# Patient Record
Sex: Male | Born: 1960
Health system: Southern US, Community
[De-identification: ages and names within clinical notes are randomized; demographics above are authoritative.]

## PROBLEM LIST (undated history)

## (undated) DIAGNOSIS — M1990 Unspecified osteoarthritis, unspecified site: Secondary | ICD-10-CM

## (undated) DIAGNOSIS — F1090 Alcohol use, unspecified, uncomplicated: Secondary | ICD-10-CM

## (undated) DIAGNOSIS — N529 Male erectile dysfunction, unspecified: Secondary | ICD-10-CM

## (undated) DIAGNOSIS — R002 Palpitations: Secondary | ICD-10-CM

## (undated) DIAGNOSIS — J45909 Unspecified asthma, uncomplicated: Secondary | ICD-10-CM

## (undated) DIAGNOSIS — R7303 Prediabetes: Secondary | ICD-10-CM

## (undated) DIAGNOSIS — F329 Major depressive disorder, single episode, unspecified: Secondary | ICD-10-CM

## (undated) DIAGNOSIS — K219 Gastro-esophageal reflux disease without esophagitis: Secondary | ICD-10-CM

## (undated) DIAGNOSIS — J309 Allergic rhinitis, unspecified: Secondary | ICD-10-CM

## (undated) DIAGNOSIS — G894 Chronic pain syndrome: Secondary | ICD-10-CM

## (undated) DIAGNOSIS — T7840XA Allergy, unspecified, initial encounter: Secondary | ICD-10-CM

## (undated) DIAGNOSIS — K3184 Gastroparesis: Secondary | ICD-10-CM

## (undated) DIAGNOSIS — Z7289 Other problems related to lifestyle: Secondary | ICD-10-CM

## (undated) DIAGNOSIS — I10 Essential (primary) hypertension: Secondary | ICD-10-CM

## (undated) DIAGNOSIS — M519 Unspecified thoracic, thoracolumbar and lumbosacral intervertebral disc disorder: Secondary | ICD-10-CM

## (undated) DIAGNOSIS — F419 Anxiety disorder, unspecified: Secondary | ICD-10-CM

## (undated) DIAGNOSIS — R892 Abnormal level of other drugs, medicaments and biological substances in specimens from other organs, systems and tissues: Secondary | ICD-10-CM

## (undated) DIAGNOSIS — M5090 Cervical disc disorder, unspecified, unspecified cervical region: Secondary | ICD-10-CM

## (undated) DIAGNOSIS — F32A Depression, unspecified: Secondary | ICD-10-CM

## (undated) DIAGNOSIS — F418 Other specified anxiety disorders: Secondary | ICD-10-CM

## (undated) DIAGNOSIS — E785 Hyperlipidemia, unspecified: Secondary | ICD-10-CM

## (undated) HISTORY — DX: Anxiety disorder, unspecified: F41.9

## (undated) HISTORY — DX: Unspecified asthma, uncomplicated: J45.909

## (undated) HISTORY — DX: Gastroparesis: K31.84

## (undated) HISTORY — DX: Alcohol use, unspecified, uncomplicated: F10.90

## (undated) HISTORY — DX: Other specified anxiety disorders: F41.8

## (undated) HISTORY — DX: Gastro-esophageal reflux disease without esophagitis: K21.9

## (undated) HISTORY — DX: Unspecified thoracic, thoracolumbar and lumbosacral intervertebral disc disorder: M51.9

## (undated) HISTORY — DX: Other problems related to lifestyle: Z72.89

## (undated) HISTORY — DX: Prediabetes: R73.03

## (undated) HISTORY — DX: Chronic pain syndrome: G89.4

## (undated) HISTORY — DX: Hyperlipidemia, unspecified: E78.5

## (undated) HISTORY — DX: Palpitations: R00.2

## (undated) HISTORY — DX: Unspecified osteoarthritis, unspecified site: M19.90

## (undated) HISTORY — DX: Allergy, unspecified, initial encounter: T78.40XA

## (undated) HISTORY — DX: Male erectile dysfunction, unspecified: N52.9

## (undated) HISTORY — DX: Abnormal level of other drugs, medicaments and biological substances in specimens from other organs, systems and tissues: R89.2

## (undated) HISTORY — DX: Major depressive disorder, single episode, unspecified: F32.9

## (undated) HISTORY — DX: Allergic rhinitis, unspecified: J30.9

## (undated) HISTORY — DX: Depression, unspecified: F32.A

## (undated) HISTORY — DX: Essential (primary) hypertension: I10

## (undated) HISTORY — PX: APPENDECTOMY: SHX54

## (undated) HISTORY — DX: Cervical disc disorder, unspecified, unspecified cervical region: M50.90

## (undated) HISTORY — PX: UPPER GASTROINTESTINAL ENDOSCOPY: SHX188

---

## 2000-03-24 ENCOUNTER — Emergency Department (HOSPITAL_COMMUNITY): Admission: EM | Admit: 2000-03-24 | Discharge: 2000-03-24 | Payer: Self-pay | Admitting: Emergency Medicine

## 2000-07-26 ENCOUNTER — Emergency Department (HOSPITAL_COMMUNITY): Admission: EM | Admit: 2000-07-26 | Discharge: 2000-07-26 | Payer: Self-pay | Admitting: Emergency Medicine

## 2003-12-22 ENCOUNTER — Ambulatory Visit (HOSPITAL_COMMUNITY): Admission: RE | Admit: 2003-12-22 | Discharge: 2003-12-22 | Payer: Self-pay | Admitting: Pulmonary Disease

## 2004-02-21 ENCOUNTER — Inpatient Hospital Stay (HOSPITAL_COMMUNITY): Admission: EM | Admit: 2004-02-21 | Discharge: 2004-02-23 | Payer: Self-pay | Admitting: Emergency Medicine

## 2004-04-06 ENCOUNTER — Encounter: Admission: RE | Admit: 2004-04-06 | Discharge: 2004-04-06 | Payer: Self-pay | Admitting: Orthopedic Surgery

## 2004-06-21 ENCOUNTER — Encounter: Admission: RE | Admit: 2004-06-21 | Discharge: 2004-06-21 | Payer: Self-pay | Admitting: Chiropractic Medicine

## 2004-07-13 HISTORY — PX: SHOULDER SURGERY: SHX246

## 2004-07-14 ENCOUNTER — Ambulatory Visit (HOSPITAL_COMMUNITY): Admission: RE | Admit: 2004-07-14 | Discharge: 2004-07-14 | Payer: Self-pay | Admitting: Orthopedic Surgery

## 2004-07-14 ENCOUNTER — Ambulatory Visit (HOSPITAL_BASED_OUTPATIENT_CLINIC_OR_DEPARTMENT_OTHER): Admission: RE | Admit: 2004-07-14 | Discharge: 2004-07-14 | Payer: Self-pay | Admitting: Orthopedic Surgery

## 2004-07-30 ENCOUNTER — Ambulatory Visit: Payer: Self-pay | Admitting: Pulmonary Disease

## 2004-08-09 ENCOUNTER — Ambulatory Visit: Payer: Self-pay | Admitting: Pulmonary Disease

## 2004-08-10 ENCOUNTER — Ambulatory Visit: Payer: Self-pay | Admitting: Gastroenterology

## 2004-08-11 ENCOUNTER — Ambulatory Visit: Payer: Self-pay | Admitting: Gastroenterology

## 2004-09-01 ENCOUNTER — Ambulatory Visit: Payer: Self-pay | Admitting: Gastroenterology

## 2004-09-02 ENCOUNTER — Ambulatory Visit (HOSPITAL_COMMUNITY): Admission: RE | Admit: 2004-09-02 | Discharge: 2004-09-02 | Payer: Self-pay | Admitting: Gastroenterology

## 2004-09-09 ENCOUNTER — Ambulatory Visit: Payer: Self-pay | Admitting: Pulmonary Disease

## 2004-09-17 ENCOUNTER — Ambulatory Visit: Payer: Self-pay | Admitting: Gastroenterology

## 2004-09-18 ENCOUNTER — Ambulatory Visit (HOSPITAL_COMMUNITY): Admission: RE | Admit: 2004-09-18 | Discharge: 2004-09-18 | Payer: Self-pay | Admitting: Pulmonary Disease

## 2004-09-23 ENCOUNTER — Ambulatory Visit: Payer: Self-pay | Admitting: Pulmonary Disease

## 2004-10-07 ENCOUNTER — Encounter: Admission: RE | Admit: 2004-10-07 | Discharge: 2004-10-07 | Payer: Self-pay | Admitting: Neurosurgery

## 2004-10-13 ENCOUNTER — Encounter: Admission: RE | Admit: 2004-10-13 | Discharge: 2004-10-13 | Payer: Self-pay | Admitting: Neurosurgery

## 2004-10-26 ENCOUNTER — Encounter: Admission: RE | Admit: 2004-10-26 | Discharge: 2004-10-26 | Payer: Self-pay | Admitting: Neurosurgery

## 2004-11-25 ENCOUNTER — Encounter: Admission: RE | Admit: 2004-11-25 | Discharge: 2004-11-25 | Payer: Self-pay | Admitting: Neurosurgery

## 2004-12-11 HISTORY — PX: CERVICAL DISCECTOMY: SHX98

## 2004-12-15 ENCOUNTER — Ambulatory Visit (HOSPITAL_COMMUNITY): Admission: RE | Admit: 2004-12-15 | Discharge: 2004-12-16 | Payer: Self-pay | Admitting: Neurosurgery

## 2005-03-02 ENCOUNTER — Ambulatory Visit: Payer: Self-pay | Admitting: Pulmonary Disease

## 2005-08-31 ENCOUNTER — Ambulatory Visit: Payer: Self-pay | Admitting: Pulmonary Disease

## 2005-11-29 ENCOUNTER — Ambulatory Visit: Payer: Self-pay | Admitting: Pulmonary Disease

## 2006-05-31 ENCOUNTER — Ambulatory Visit: Payer: Self-pay | Admitting: Pulmonary Disease

## 2006-07-18 ENCOUNTER — Ambulatory Visit: Payer: Self-pay | Admitting: Gastroenterology

## 2006-07-20 ENCOUNTER — Ambulatory Visit (HOSPITAL_COMMUNITY): Admission: RE | Admit: 2006-07-20 | Discharge: 2006-07-20 | Payer: Self-pay | Admitting: Gastroenterology

## 2006-07-28 ENCOUNTER — Ambulatory Visit: Payer: Self-pay | Admitting: Gastroenterology

## 2006-07-28 ENCOUNTER — Encounter: Payer: Self-pay | Admitting: Gastroenterology

## 2006-09-01 ENCOUNTER — Encounter (INDEPENDENT_AMBULATORY_CARE_PROVIDER_SITE_OTHER): Payer: Self-pay | Admitting: *Deleted

## 2006-09-01 ENCOUNTER — Ambulatory Visit (HOSPITAL_COMMUNITY): Admission: RE | Admit: 2006-09-01 | Discharge: 2006-09-01 | Payer: Self-pay | Admitting: Gastroenterology

## 2006-09-11 ENCOUNTER — Ambulatory Visit: Payer: Self-pay | Admitting: Gastroenterology

## 2007-03-13 ENCOUNTER — Encounter: Admission: RE | Admit: 2007-03-13 | Discharge: 2007-03-13 | Payer: Self-pay | Admitting: Orthopedic Surgery

## 2007-12-27 ENCOUNTER — Encounter: Payer: Self-pay | Admitting: Pulmonary Disease

## 2008-01-03 DIAGNOSIS — F411 Generalized anxiety disorder: Secondary | ICD-10-CM | POA: Insufficient documentation

## 2008-01-03 DIAGNOSIS — E785 Hyperlipidemia, unspecified: Secondary | ICD-10-CM

## 2008-01-03 DIAGNOSIS — J209 Acute bronchitis, unspecified: Secondary | ICD-10-CM

## 2008-01-03 DIAGNOSIS — Z8679 Personal history of other diseases of the circulatory system: Secondary | ICD-10-CM | POA: Insufficient documentation

## 2008-01-03 DIAGNOSIS — R079 Chest pain, unspecified: Secondary | ICD-10-CM

## 2008-01-03 DIAGNOSIS — K219 Gastro-esophageal reflux disease without esophagitis: Secondary | ICD-10-CM | POA: Insufficient documentation

## 2008-02-18 ENCOUNTER — Ambulatory Visit: Payer: Self-pay | Admitting: Pulmonary Disease

## 2008-02-18 DIAGNOSIS — I10 Essential (primary) hypertension: Secondary | ICD-10-CM

## 2008-02-22 LAB — CONVERTED CEMR LAB
Albumin: 4.3 g/dL (ref 3.5–5.2)
Basophils Absolute: 0.1 10*3/uL (ref 0.0–0.1)
Basophils Relative: 0.7 % (ref 0.0–1.0)
Calcium: 9.5 mg/dL (ref 8.4–10.5)
Chloride: 100 meq/L (ref 96–112)
Creatinine, Ser: 1 mg/dL (ref 0.4–1.5)
Direct LDL: 78.5 mg/dL
Eosinophils Absolute: 0.3 10*3/uL (ref 0.0–0.7)
GFR calc Af Amer: 102 mL/min
GFR calc non Af Amer: 84 mL/min
HCT: 41.6 % (ref 39.0–52.0)
HDL: 31.3 mg/dL — ABNORMAL LOW (ref >39.0)
Hgb A1c MFr Bld: 5.6 % (ref 4.6–6.0)
MCHC: 35.2 g/dL (ref 30.0–36.0)
MCV: 89.5 fL (ref 78.0–100.0)
Monocytes Absolute: 0.4 10*3/uL (ref 0.1–1.0)
Neutro Abs: 4.9 10*3/uL (ref 1.4–7.7)
Neutrophils Relative %: 67.9 % (ref 43.0–77.0)
RBC: 4.64 M/uL (ref 4.22–5.81)
TSH: 0.99 microintl units/mL (ref 0.35–5.50)
Total Bilirubin: 0.9 mg/dL (ref 0.3–1.2)

## 2008-03-12 ENCOUNTER — Encounter: Payer: Self-pay | Admitting: Adult Health

## 2008-03-25 ENCOUNTER — Ambulatory Visit: Payer: Self-pay | Admitting: Pulmonary Disease

## 2008-04-01 ENCOUNTER — Telehealth: Payer: Self-pay | Admitting: Pulmonary Disease

## 2008-04-02 LAB — CONVERTED CEMR LAB
Cholesterol: 189 mg/dL (ref 0–200)
Triglycerides: 201 mg/dL (ref 0–149)

## 2008-07-04 ENCOUNTER — Telehealth (INDEPENDENT_AMBULATORY_CARE_PROVIDER_SITE_OTHER): Payer: Self-pay | Admitting: *Deleted

## 2008-07-14 ENCOUNTER — Telehealth (INDEPENDENT_AMBULATORY_CARE_PROVIDER_SITE_OTHER): Payer: Self-pay | Admitting: *Deleted

## 2008-07-28 ENCOUNTER — Encounter: Payer: Self-pay | Admitting: Pulmonary Disease

## 2008-09-10 ENCOUNTER — Encounter: Payer: Self-pay | Admitting: Pulmonary Disease

## 2008-09-24 ENCOUNTER — Encounter: Payer: Self-pay | Admitting: Pulmonary Disease

## 2008-10-13 ENCOUNTER — Telehealth (INDEPENDENT_AMBULATORY_CARE_PROVIDER_SITE_OTHER): Payer: Self-pay | Admitting: *Deleted

## 2008-10-27 ENCOUNTER — Telehealth: Payer: Self-pay | Admitting: Pulmonary Disease

## 2008-10-30 ENCOUNTER — Ambulatory Visit: Payer: Self-pay | Admitting: Pulmonary Disease

## 2008-11-03 ENCOUNTER — Ambulatory Visit: Payer: Self-pay | Admitting: Pulmonary Disease

## 2008-11-03 LAB — CONVERTED CEMR LAB
ALT: 38 units/L (ref 0–53)
AST: 29 units/L (ref 0–37)
Alkaline Phosphatase: 51 units/L (ref 39–117)
BUN: 21 mg/dL (ref 6–23)
Basophils Absolute: 0.1 10*3/uL (ref 0.0–0.1)
Basophils Relative: 0.9 % (ref 0.0–3.0)
CO2: 31 meq/L (ref 19–32)
Chloride: 102 meq/L (ref 96–112)
Creatinine, Ser: 1.2 mg/dL (ref 0.4–1.5)
Direct LDL: 133.9 mg/dL
Eosinophils Absolute: 0.2 10*3/uL (ref 0.0–0.7)
Eosinophils Relative: 2.9 % (ref 0.0–5.0)
GFR calc non Af Amer: 68 mL/min
HDL: 29.6 mg/dL — ABNORMAL LOW (ref >39.0)
Hgb A1c MFr Bld: 5.6 % (ref 4.6–6.0)
Iron: 218 ug/dL — ABNORMAL HIGH (ref 42–165)
Lymphocytes Relative: 26.5 % (ref 12.0–46.0)
Neutrophils Relative %: 62.8 % (ref 43.0–77.0)
PSA: 0.58 ng/mL (ref 0.10–4.00)
Platelets: 187 10*3/uL (ref 150–400)
Potassium: 4.4 meq/L (ref 3.5–5.1)
RBC: 4.68 M/uL (ref 4.22–5.81)
TSH: 1.88 microintl units/mL (ref 0.35–5.50)
Total Bilirubin: 0.9 mg/dL (ref 0.3–1.2)
Triglycerides: 278 mg/dL (ref 0–149)
VLDL: 56 mg/dL — ABNORMAL HIGH (ref 0–40)
WBC: 7.1 10*3/uL (ref 4.5–10.5)

## 2009-01-11 DIAGNOSIS — G894 Chronic pain syndrome: Secondary | ICD-10-CM

## 2009-01-11 DIAGNOSIS — K3184 Gastroparesis: Secondary | ICD-10-CM

## 2009-01-11 DIAGNOSIS — M1990 Unspecified osteoarthritis, unspecified site: Secondary | ICD-10-CM | POA: Insufficient documentation

## 2009-04-06 ENCOUNTER — Telehealth: Payer: Self-pay | Admitting: Pulmonary Disease

## 2009-10-11 ENCOUNTER — Telehealth (INDEPENDENT_AMBULATORY_CARE_PROVIDER_SITE_OTHER): Payer: Self-pay | Admitting: Acute Care

## 2009-10-12 ENCOUNTER — Ambulatory Visit: Payer: Self-pay | Admitting: Pulmonary Disease

## 2009-10-12 ENCOUNTER — Telehealth: Payer: Self-pay | Admitting: Adult Health

## 2009-10-12 DIAGNOSIS — J069 Acute upper respiratory infection, unspecified: Secondary | ICD-10-CM | POA: Insufficient documentation

## 2009-12-11 ENCOUNTER — Ambulatory Visit: Payer: Self-pay | Admitting: Pulmonary Disease

## 2009-12-11 LAB — CONVERTED CEMR LAB
ALT: 30 units/L (ref 0–53)
Albumin: 4.5 g/dL (ref 3.5–5.2)
BUN: 20 mg/dL (ref 6–23)
Basophils Relative: 0.6 % (ref 0.0–3.0)
Bilirubin, Direct: 0.1 mg/dL (ref 0.0–0.3)
CO2: 28 meq/L (ref 19–32)
Calcium: 9.3 mg/dL (ref 8.4–10.5)
Chloride: 107 meq/L (ref 96–112)
Cholesterol: 164 mg/dL (ref 0–200)
Creatinine, Ser: 1.1 mg/dL (ref 0.4–1.5)
Direct LDL: 94.9 mg/dL
Eosinophils Absolute: 0.1 10*3/uL (ref 0.0–0.7)
Glucose, Bld: 112 mg/dL — ABNORMAL HIGH (ref 70–99)
Hemoglobin: 14.5 g/dL (ref 13.0–17.0)
Hgb A1c MFr Bld: 5.3 % (ref 4.6–6.5)
Lymphocytes Relative: 22.9 % (ref 12.0–46.0)
MCHC: 35.7 g/dL (ref 30.0–36.0)
Monocytes Relative: 6.2 % (ref 3.0–12.0)
Neutrophils Relative %: 68.1 % (ref 43.0–77.0)
PSA: 0.41 ng/mL (ref 0.10–4.00)
RBC: 4.41 M/uL (ref 4.22–5.81)
Total Protein: 7.4 g/dL (ref 6.0–8.3)
Triglycerides: 263 mg/dL — ABNORMAL HIGH (ref 0.0–149.0)
WBC: 6.5 10*3/uL (ref 4.5–10.5)

## 2010-07-07 ENCOUNTER — Telehealth: Payer: Self-pay | Admitting: Pulmonary Disease

## 2010-07-07 DIAGNOSIS — T7840XA Allergy, unspecified, initial encounter: Secondary | ICD-10-CM | POA: Insufficient documentation

## 2010-08-19 ENCOUNTER — Ambulatory Visit: Payer: Self-pay | Admitting: Pulmonary Disease

## 2010-08-24 LAB — CONVERTED CEMR LAB
BUN: 23 mg/dL (ref 6–23)
Cholesterol: 227 mg/dL — ABNORMAL HIGH (ref 0–200)
Creatinine, Ser: 1.1 mg/dL (ref 0.4–1.5)
GFR calc non Af Amer: 77.26 mL/min (ref >60.00)
Potassium: 4.9 meq/L (ref 3.5–5.1)
Triglycerides: 128 mg/dL (ref 0.0–149.0)
VLDL: 25.6 mg/dL (ref 0.0–40.0)

## 2010-10-03 ENCOUNTER — Encounter: Payer: Self-pay | Admitting: Orthopedic Surgery

## 2010-10-14 NOTE — Assessment & Plan Note (Signed)
Summary: 2-3 months/apc   CC:  13 month ROV & review of mult medical problems....  History of Present Illness: 50 y/o WM here for a follow up visit and CPX... he has multiple medical problems as noted below...  he is followed for his chronic neck, chest, arm, etc pain in a pain management clinic by DrFullerton...   ~  December 11, 2009:  he's had a pretty good year- save for recent AB exac treated w/ ZPak, Mucinex, & resolved... he is requesting change to generics where poss due to cost... still followed in the [facility] by DrFullerton for his chr pain syndrome & neck problems on Percocet, Celexa, Zanaflex...    Current Problems:   PHYSICAL EXAMINATION (ICD-V70.0) - pt didn't bring med bottles or list of his current medications today- he states that he knows his meds.  ~  NOTE:  he will be due for a screening colonoscopy soon.  ASTHMATIC BRONCHITIS, ACUTE (ICD-466.0) - prev on SINGULAIR but stopped due to $$... he was prev eval by DrESL in 2001 w/ mult meds- Advair, Zyrtek, Nasonex, Astelin, etc.  ~  CXR 2/10 showed normal heart size & clear lungs...  ~  f/u CXR 4/11 showed clear lungs, heart at upper limits of norm, NAD.  HYPERTENSION, BENIGN (ICD-401.1) - on Diovan 80mg /d & low sodium diet... BP= 120/78 today but not checking BP at home... denies HA, fatigue, visual changes, CP, palipit, dizziness, syncope, dyspnea, edema, etc... he would like to change to a generic & we discussed LOSARTAN 100mg /d.  CHEST PAIN, ATYPICAL, HX OF (ICD-V15.89) & PALPITATIONS, HX OF (ICD-V12.50) - on ASA 81mg /d...  he had a cardiac eval in the 90's for these symptoms and mult risk factors including +FamHx by DrGrodecki w/ baseline EKG showing NSSTTWA;  2DEcho was neg w/ norm LV size & function- EF= 60%, no MVP, mild MR;  StressTest was neg x for occas PVC's;  Event Recorder w/ lots of symptoms but only occas PVC's and PAC';  treated w/ rec for smoking cessation, no caffeine, Tranxene Prn...  ~   NuclearStressTest 2005 was neg- no ischemia, no infarct... EF= 54%.  ~  He saw several Orthopedists, DrZ for Rheum, & DrWeymann (neg EMG) in 2005 due to his atyp CWP.  ~  EKG 4/11 showed NSR, WNL, NAD...  HYPERLIPIDEMIA (ICD-272.4) - on TRICORmg/d + low chol/ low fat diet...  ~  FLP 9/07 (wt= 178#) showed TChol 197, TG 408, HDL 31, LDL 93... Fibrate Rx started w/ Tricor.  ~  FLP 2/10 (wt= 186#) showed TChol 201, TG 278, HDL 30, LDL 134... needs better diet & may need statin added.  ~  FLP 4/11 (wt=185#) showed TChol 164, TG 263, HDL 33, LDL 53... change to FENOFIBRATE 160mg /d for $$.  DIABETES MELLITUS, BORDERLINE (ICD-790.29) - on diet Rx alone but he hasn't followed up regularly and his wt is 185#... we discussed diet + exercise program...  ~  prev labs showed FBS= 121... diet Rx discussed.  ~  labs 2/10 showed BS= 107, HgA1c= 5.6  ~  labs 4/11 showed BS= 112, A1c= 5.3  GERD (ICD-530.81) - followed by DrKaplan & eval in 2007 w/ Ba Swallow showed esoph stricture w/ dysphagia... last EGD 12/07 showed esophagitis & stricture dilated... he rec treatment w/ PPI & pt switched to PRILOSEC OTC 20mg /d...  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 he tells me he is followed in a chronic pain clinic by DrFullerton and treated w/ PERCOCET 7.5mg  Prn,  CELEXA ?20mg /d, & ZANAFLEX 4mg  Prn...  ANXIETY (ICD-300.00) - on ALPRAZOLAM 0.5mg  Prn for nerves...   Allergies: 1)  ! Bethanne Ginger  Comments:  Nurse/Medical Assistant: The patient's medications and allergies were reviewed with the patient and were updated in the Medication and Allergy Lists.  Past History:  Past Medical History:  ASTHMATIC BRONCHITIS, ACUTE (ICD-466.0) HYPERTENSION,  BENIGN (ICD-401.1) CHEST PAIN, ATYPICAL, HX OF (ICD-V15.89) PALPITATIONS, HX OF (ICD-V12.50) HYPERLIPIDEMIA (ICD-272.4) DIABETES MELLITUS, BORDERLINE (ICD-790.29) GERD (ICD-530.81) GASTROPARESIS (ICD-536.3) DEGENERATIVE JOINT DISEASE (ICD-715.90) CHRONIC PAIN SYNDROME (ICD-338.4) ANXIETY (ICD-300.00)  Past Surgical History: S/P right shoulder surgery 11/05 by DrMurphy w/ arthroscopy, debridement, distal clavicle resection S/P ant cervical discectomy, decompression, foaminotomy, and fusion by DrBotero 4/06  Family History: Reviewed history from 11/03/2008 and no changes required. Father died age 11 w/ "lung disease", smoker... Mother alive age 27 w/ known ASHD... No Siblings  Social History: Reviewed history from 10/12/2009 and no changes required. 2nd Marriage, 22 yrs... 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" retired on disability  Review of Systems  The patient denies fever, chills, sweats, anorexia, fatigue, weakness, malaise, weight loss, sleep disorder, blurring, diplopia, eye irritation, eye discharge, vision loss, eye pain, photophobia, earache, ear discharge, tinnitus, decreased hearing, nasal congestion, nosebleeds, sore throat, hoarseness, chest pain, palpitations, syncope, dyspnea on exertion, orthopnea, PND, peripheral edema, cough, dyspnea at rest, excessive sputum, hemoptysis, wheezing, pleurisy, nausea, vomiting, diarrhea, constipation, change in bowel habits, abdominal pain, melena, hematochezia, jaundice, gas/bloating, indigestion/heartburn, dysphagia, odynophagia, dysuria, hematuria, urinary frequency, urinary hesitancy, nocturia, incontinence, back pain, joint pain, joint swelling, muscle cramps, muscle weakness, stiffness, arthritis, sciatica, restless legs, leg pain at night, leg pain with exertion, rash, itching, dryness, suspicious lesions, paralysis, paresthesias, seizures, tremors, vertigo,  transient blindness, frequent falls, frequent headaches, difficulty walking, depression, anxiety, memory loss, confusion, cold intolerance, heat intolerance, polydipsia, polyphagia, polyuria, unusual weight change, abnormal bruising, bleeding, enlarged lymph nodes, urticaria, allergic rash, hay fever, and recurrent infections.    Vital Signs:  Patient profile:   50 year old male Height:      67 inches Weight:      185 pounds BMI:     29.08 O2 Sat:      98 % on Room air Temp:     96.9 degrees F oral Pulse rate:   79 / minute BP sitting:   120 / 78  (right arm) Cuff size:   regular  Vitals Entered By: Randell Loop CMA (December 11, 2009 9:56 AM)  O2 Sat at Rest %:  98 O2 Flow:  Room air CC: 13 month ROV & review of mult medical problems... Is Patient Diabetic? No Pain Assessment Patient in pain? no      Comments meds udpated today   Physical Exam  Additional Exam:  WD, WN, 50 y/o WM in NAD... GENERAL:  Alert & oriented; pleasant & cooperative... HEENT:  [place]/AT, EOM-wnl, PERRLA, Fundi-benign, EACs-clear, TMs-wnl, NOSE-clear, THROAT-clear & wnl. NECK:  Supple w/ decrROM; scar of CSpine surg; no JVD; normal carotid impulses w/o bruits;  no thyromegaly or nodules palpated; no lymphadenopathy. CHEST:  Clear to P & A; without wheezes/ rales/ or rhonchi. HEART:  Regular Rhythm; without murmurs/ rubs/ or gallops. ABDOMEN:  Soft &  nontender; normal bowel sounds; no organomegaly or masses detected. RECTAL:  Neg - prostate 2+ & nontender w/o nodules; stool hematest neg. EXT: without deformities, mild arthritic changes; no varicose veins/ venous insuffic/ or edema. NEURO:  CN's intact; motor testing normal; sensory testing normal; gait normal & balance OK. DERM:  No lesions noted; no rash etc...    CXR  Procedure date:  12/11/2009  Findings:      CHEST - 2 VIEW Comparison: 11/03/2008   Findings: Heart size remains at the upper limits of normal and is stable.  No evidence of  pulmonary infiltrate or edema.  No evidence of pleural effusion.  No mass or adenopathy identified.   IMPRESSION: Stable exam.  No active disease.   Read By:  Danae Orleans,  M.D.   EKG  Procedure date:  12/11/2009  Findings:      Normal sinus rhythm with rate of:  70/min... Tracing is WNL, NAD...  SN   MISC. Report  Procedure date:  12/11/2009  Findings:      Lipid Panel (LIPID)   Cholesterol               164 mg/dL                   0-454   Triglycerides        [H]  263.0 mg/dL                 0.9-811.9   HDL                  [L]  14.78 mg/dL                 >29.56 Cholesterol LDL - Direct                             94.9 mg/dL   BMP (METABOL)   Sodium                    145 mEq/L                   135-145   Potassium                 4.2 mEq/L                   3.5-5.1   Chloride                  107 mEq/L                   96-112   Carbon Dioxide            28 mEq/L                    19-32   Glucose              [H]  112 mg/dL                   21-30   BUN                       20 mg/dL                    8-65   Creatinine                1.1 mg/dL  0.4-1.5   Calcium                   9.3 mg/dL                   1.6-10.9   GFR                       75.04 mL/min                >60  Hemoglobin A1C (A1C)   Hemoglobin A1C            5.3 %                       4.6-6.5  Hepatic/Liver Function Panel (HEPATIC)   Total Bilirubin           0.7 mg/dL                   6.0-4.5   Direct Bilirubin          0.1 mg/dL                   4.0-9.8   Alkaline Phosphatase      51 U/L                      39-117   AST                       20 U/L                      0-37   ALT                       30 U/L                      0-53   Total Protein             7.4 g/dL                    1.1-9.1   Albumin                   4.5 g/dL                    4.7-8.2  Comments:      CBC Platelet w/Diff (CBCD)   White Cell Count          6.5 K/uL                     4.5-10.5   Red Cell Count            4.41 Mil/uL                 4.22-5.81   Hemoglobin                14.5 g/dL                   95.6-21.3   Hematocrit                40.7 %                      39.0-52.0   MCV  92.2 fl                     78.0-100.0   Platelet Count            180.0 K/uL                  150.0-400.0   Neutrophil %              68.1 %                      43.0-77.0   Lymphocyte %              22.9 %                      12.0-46.0   Monocyte %                6.2 %                       3.0-12.0   Eosinophils%              2.2 %                       0.0-5.0   Basophils %               0.6 %                       0.0-3.0  TSH (TSH)   FastTSH                   0.79 uIU/mL                 0.35-5.50   Prostate Specific Antigen (PSA)   PSA-Hyb                   0.41 ng/mL                  0.10-4.00   Impression & Recommendations:  Problem # 1:  ASTHMATIC BRONCHITIS, ACUTE (ICD-466.0) He has done well w/o exac...   Problem # 2:  HYPERTENSION, BENIGN (ICD-401.1) Controlled on the Diovan, we will change to LOSARTAN 100mg /d for $$ reasons... His updated medication list for this problem includes:    Losartan Potassium 100 Mg Tabs (Losartan potassium) .Marland Kitchen... Take 1 tab by mouth once daily...  Orders: 12 Lead EKG (12 Lead EKG) T-2 View CXR (71020TC) TLB-Lipid Panel (80061-LIPID) TLB-BMP (Basic Metabolic Panel-BMET) (80048-METABOL) TLB-Hepatic/Liver Function Pnl (80076-HEPATIC) TLB-CBC Platelet - w/Differential (85025-CBCD) TLB-TSH (Thyroid Stimulating Hormone) (84443-TSH) TLB-A1C / Hgb A1C (Glycohemoglobin) (83036-A1C) TLB-PSA (Prostate Specific Antigen) (84153-PSA)  Problem # 3:  CHEST PAIN, ATYPICAL, HX OF (ICD-V15.89) Cardiac is stable, EKG is WNL, he states that prev chest symptoms were all "panic" attacks... Orders: 12 Lead EKG (12 Lead EKG)  Problem # 4:  HYPERLIPIDEMIA (ICD-272.4) FLP is fair-  he wants less $$ med & change Tricor to  Fenofibrate but what he really needs is better diet, incr exercuise, get wt down & he was so counselled... His updated medication list for this problem includes:    Fenofibrate 160 Mg Tabs (Fenofibrate) .Marland Kitchen... Take 1 tab by mouth once daily...  Problem # 5:  DIABETES MELLITUS, BORDERLINE (ICD-790.29) BS borderline but A1c's all normal... get on diet, get wt down...  Problem # 6:  GERD (ICD-530.81) GI is stable.Marland KitchenMarland Kitchen  he will need screening colon soon... His updated medication list for this problem includes:    Prilosec Otc 20 Mg Tbec (Omeprazole magnesium) .Marland Kitchen... Take 1 tablet by mouth once a day  Problem # 7:  CHRONIC PAIN SYNDROME (ICD-338.4) He has mod neck problems w/ pain... followed in pain management clinic DrFullerton...  Problem # 8:  ANXIETY (ICD-300.00) We will refill his Alprazolam for nerves and painic disorder... His updated medication list for this problem includes:    Alprazolam 0.5 Mg Tabs (Alprazolam) .Marland Kitchen... Take 1 tablet by mouth three times a day as needed for nerves...    Celexa 10 Mg Tabs (Citalopram hydrobromide) .Marland Kitchen... Take as directed by pain management...  Complete Medication List: 1)  Adult Aspirin Low Strength 81 Mg Tbdp (Aspirin) .... Take 1 tablet by mouth once a day 2)  Losartan Potassium 100 Mg Tabs (Losartan potassium) .... Take 1 tab by mouth once daily.Marland KitchenMarland Kitchen 3)  Fenofibrate 160 Mg Tabs (Fenofibrate) .... Take 1 tab by mouth once daily.Marland KitchenMarland Kitchen 4)  Prilosec Otc 20 Mg Tbec (Omeprazole magnesium) .... Take 1 tablet by mouth once a day 5)  Alprazolam 0.5 Mg Tabs (Alprazolam) .... Take 1 tablet by mouth three times a day as needed for nerves... 6)  Oxycodone-acetaminophen 7.5-500 Mg Tabs (Oxycodone-acetaminophen) .... Take as directed by pain management... 7)  Zanaflex 4 Mg Caps (Tizanidine hcl) .... Take as directed by pain management... 8)  Celexa 10 Mg Tabs (Citalopram hydrobromide) .... Take as directed by pain management...  Other Orders: Prescription Created  Electronically [phone])  Patient Instructions: 1)  Today we updated your med list- see below.... 2)  We wrote new perscriptions and included ALL generics to save maximum $$$.... 3)  Today we did your follow up CXR, EKG, & fasting blood work... please call the "phone tree" in a few days for your lab results.Marland KitchenMarland Kitchen 4)  Call for any problems.Marland KitchenMarland Kitchen 5)  Please schedule a follow-up appointment in 6 months. Prescriptions: ALPRAZOLAM 0.5 MG  TABS (ALPRAZOLAM) Take 1 tablet by mouth three times a day as needed for nerves...  #100 x prn   Entered and Authorized by:   Michele Mcalpine MD   Signed by:   Michele Mcalpine MD on 12/11/2009   Method used:   Print then Give to Patient   RxID:   [id number] FENOFIBRATE 160 MG TABS (FENOFIBRATE) take 1 tab by mouth once daily...  #30 x prn   Entered and Authorized by:   Michele Mcalpine MD   Signed by:   Michele Mcalpine MD on 12/11/2009   Method used:   Print then Give to Patient   RxID:   [id number] LOSARTAN POTASSIUM 100 MG TABS (LOSARTAN POTASSIUM) take 1 tab by mouth once daily...  #30 x prn   Entered and Authorized by:   Michele Mcalpine MD   Signed by:   Michele Mcalpine MD on 12/11/2009   Method used:   Print then Give to Patient   RxID:   [id number]      CardioPerfect ECG  ID: [id number] Patient: Earna Coder DOB: [birth year] Age: 50 Years Old Sex: Male Race: White Physician: Rocky Rishel Technician: Randell Loop CMA Height: 67 Weight: 185 Status: Unconfirmed Past Medical History:   ASTHMATIC BRONCHITIS, ACUTE (ICD-466.0) - prev on SINGULAIR but stopped due to $$... he was prev eval by DrESL in 2001 w/ mult meds- Advair, Zyrtek, Nasonex, Astelin, etc...  ~  CXR 2/10 showed normal heart size & clear lungs...  HYPERTENSION,  BENIGN (ICD-401.1) - on DIOVAN 80mg /d & low sodium diet...   CHEST PAIN, ATYPICAL, HX OF (ICD-V15.89) & PALPITATIONS, HX OF (ICD-V12.50) - on ASA 81mg /d... he had a cardiac eval in the 90's for these symptoms and mult  risk factors including +FamHx by DrGrodecki w/ baseline EKG showing NSSTTWA;  2DEcho was neg w/ norm LV size & function- EF= 60%, no MVP, mild MR;  StressTest was neg x for occas PVC's;  Event Recorder w/ lots of symptoms but only occas PVC's and PAC';  treated w/ rec for smoking cessation, no caffeine, Tranxene Prn...  ~  NuclearStressTest 2005 was neg- no ischemia, no infarct... EF= 54%...  ~  He saw several Orthopedists,DrZ for Rheum, & DrWeymann (neg EMG) in 2005 due to his atyp CWP...  HYPERLIPIDEMIA (ICD-272.4) - on FEOFIBRATE 160mg /d + low chol/ low fat diet...  ~  FLP 9/07 (wt= 178#) showed TChol 197, TG 408, HDL 31, LDL 93... Fibrate Rx started w/ Tricor.  ~  FLP 2/10 (wt= 186#) showed TChol 201, TG 278, HDL 30, LDL 134... needs better diet & may need statin added.    Recorded: 12/11/2009 11:02 AM P/PR: 113 ms / 153 ms - Heart rate (maximum exercise) QRS: 94 QT/QTc/QTd: 390 ms / 409 ms / 16 ms - Heart rate (maximum exercise)  P/QRS/T axis: 15 deg / 33 deg / 46 deg - Heart rate (maximum exercise)  Heartrate: 71 bpm  Interpretation:  Normal sinus rhythm with rate of:  70/min... Tracing is WNL, NAD...  SN

## 2010-10-14 NOTE — Progress Notes (Signed)
  Phone Note Call from Patient Call back at [facility] Phone [phone]   Caller: Patient Reason for Call: Acute Illness Complaint: Cough/Sore throat, Earache/Ear Infection Summary of Call: Pt called [facility]. Stated had 1 day h/o sinus congestion. No fever. No sig. cough production. Tryed to make recommendation re: sinus hygiene regimen, and offered to make him an appointment. Pt became belligerent and stated "I know what I need" and hung up. Initial call taken by: Simonne Martinet NP,  October 11, 2009 9:32 PM

## 2010-10-14 NOTE — Progress Notes (Signed)
Summary: referral  Phone Note Call from Patient   Caller: Patient--[phone] Call For: nadel Reason for Call: Talk to Nurse Summary of Call: Patient asking if Dr. Kriste Basque would refer him to Dr. Stevphen Rochester for allergies.  He used to see Dr. Corinda Gubler, but it has been so long since he has seen him he needs to be referred. Initial call taken by: Lehman Prom,  July 07, 2010 8:40 AM  Follow-up for Phone Call        Pt states he saw Dr. Tommye Standard years ago and want to go back to him for his allergies but he needs a referral since it has been so long. Please advise if ok to send order. Carron Curie CMA  July 07, 2010 9:07 AM   Additional Follow-up for Phone Call Additional follow up Details #1::        per SN----yes we can refer him to Dr. Dennard Nip   please sett up allergy eval with Dr. Stevphen Rochester.  order has already been placed for this Randell Loop [facility]  July 07, 2010 12:39 PM   Appt scheduled with Dr. Stevphen Rochester for Thurs. 07/08/10 at 10:15. LMOAM for pt to return my call. Alfonso Ramus  July 07, 2010 12:52 PM   New Problems: ALLERGY (ICD-995.3)   Additional Follow-up for Phone Call Additional follow up Details #2::    Pt returned call, I advised him of Thursday appointment and if [facility] needed anything else she will call him. Zackery Barefoot CMA  July 07, 2010 1:03 PM  Called pt and he is aware of appt and will be there. Alfonso Ramus  July 07, 2010 5:29 PM   New Problems: ALLERGY (ICD-995.3)

## 2010-10-14 NOTE — Assessment & Plan Note (Signed)
Summary: ROV/ 6 MONTHS/ MBW   CC:  8 month ROV & review of mult medical problems....  History of Present Illness: 50 y/o WM here for a follow up visit... he has multiple medical problems as noted below...  he is followed for his chronic neck, chest, arm, etc pain at [facility]...   ~  December 11, 2009:  he's had a pretty good year- save for recent AB exac treated w/ ZPak, Mucinex, & resolved... he is requesting change to generics where poss due to cost... still followed in the [facility] by for his chr pain syndrome & neck problems on Percocet, Celexa, Zanaflex...   ~  August 19, 2010:  8 month ROV & doing reasonably well- c/o decr energy & drive, wonders about "Low-T" and tried OTC herbal Rx "horney goat weed" w/o benefit... we discussed checking testos level & Rx w/ Cialis for ED... Breathing is stable w/o exac;  BP controlled on the Losartan;  persist CP, chr pain syndrome treated in the Pain clinic;  due for f/u FLP to check TGs;  we refilled our meds today.    Current Problems:   PHYSICAL EXAMINATION (ICD-V70.0) - pt didn't bring med bottles or list of his current medications today- he states that he knows his meds.  ~  GI:  he has been followed by DrKaplan for EGD/ stricture dil 2007... needs routine colonoscopy.  ~  GU:  no hx prostate problems;  PSA 4/11= 0.41; c/o ED & Rx w/ Cialis trial.  ~  Immunizations:  he has declined Flu vaccine & all vaccinations.  ASTHMATIC BRONCHITIS, ACUTE (ICD-466.0) - prev on Singulair but stopped due to $$... he was prev eval by DrESL in 2001 w/ mult meds- Advair, Zyrtek, Nasonex, Astelin, etc.  ~  CXR 2/10 showed normal heart size & clear lungs...  ~  f/u CXR 4/11 showed clear lungs, heart at upper limits of norm, NAD.  HYPERTENSION, BENIGN (ICD-401.1) - on LOSARTAN 100mg /d & low sodium diet... BP= 110/68 today but not checking BP at home... denies HA, fatigue, visual changes, CP, palipit, dizziness, syncope,  dyspnea, edema, etc...  CHEST PAIN, ATYPICAL, HX OF (ICD-V15.89) & PALPITATIONS, HX OF (ICD-V12.50) - on ASA 81mg /d...  he had a cardiac eval in the 90's for these symptoms and mult risk factors including +FamHx by DrGrodecki w/ baseline EKG showing NSSTTWA;  2DEcho was neg w/ norm LV size & function- EF= 60%, no MVP, mild MR;  StressTest was neg x for occas PVC's;  Event Recorder w/ lots of symptoms but only occas PVC's and PAC';  treated w/ rec for smoking cessation, no caffeine, Tranxene Prn...  ~  NuclearStressTest 2005 was neg- no ischemia, no infarct... EF= 54%.  ~  He saw several Orthopedists, DrZ for Rheum, & DrWeymann (neg EMG) in 2005 due to his atyp CWP.  ~  EKG 4/11 showed NSR, WNL, NAD...  HYPERLIPIDEMIA (ICD-272.4) - on FENOFIBRATE 160mg /d + low chol/ low fat diet...  ~  FLP 9/07 (wt= 178#) showed TChol 197, TG 408, HDL 31, LDL 93... Fibrate Rx started w/ Tricor.  ~  FLP 2/10 (wt= 186#) showed TChol 201, TG 278, HDL 30, LDL 134... needs better diet & may need statin added.  ~  FLP 4/11 (wt=185#) showed TChol 164, TG 263, HDL 33, LDL 53... change to FENOFIBRATE 160mg /d for $$.  ~  FLP 12/11 (wt=177#) showed TChol 227, TG 128, HDL 41, LDL 151... TG is great but Chol  is up, may need statin.  DIABETES MELLITUS, BORDERLINE (ICD-790.29) - on diet Rx alone... we discussed diet + exercise program...  ~  prev labs showed FBS= 121... diet Rx discussed.  ~  labs 2/10 showed BS= 107, HgA1c= 5.6  ~  labs 4/11 showed BS= 112, A1c= 5.3  ~  labd 12/11 showed BS= 107  GERD (ICD-530.81) - followed by DrKaplan & eval in 2007 w/ Ba Swallow showed esoph stricture w/ dysphagia... last EGD 12/07 showed esophagitis & stricture dilated... he rec treatment w/ PPI & pt switched to PRILOSEC OTC 20mg /d...  DEGENERATIVE JOINT DISEASE (ICD-715.90) - he had right shoulder surgery 11/05 by DrMurphy w/ arthroscopy, debridement, distal clavicle resection.  CHRONIC PAIN SYNDROME (ICD-338.4) - he had C3-4  spondylosis w/ HNP requiring ant cervical discectomy, decompression, foaminotomy, and fusion by [facility] 4/06... he tells me he is followed in a chronic pain clinic and treated w/ PERCOCET 7.5mg  Prn,  CELEXA ?20mg /d, & ZANAFLEX 4mg  Prn...  ANXIETY (ICD-300.00) - on ALPRAZOLAM 0.5mg  Prn for nerves...   Preventive Screening-Counseling & Management  Alcohol-Tobacco     Smoking Status: quit     Year Quit: 09-2007     Pack years: 62yrs, socially  Allergies: 1)  ! Bethanne Ginger  Comments:  Nurse/Medical Assistant: The patient's medications and allergies were reviewed with the patient and were updated in the Medication and Allergy Lists.  Past History:  Past Medical History: ASTHMATIC BRONCHITIS, ACUTE (ICD-466.0) HYPERTENSION, BENIGN (ICD-401.1) CHEST PAIN, ATYPICAL, HX OF (ICD-V15.89) PALPITATIONS, HX OF (ICD-V12.50) HYPERLIPIDEMIA (ICD-272.4) DIABETES MELLITUS, BORDERLINE (ICD-790.29) GERD (ICD-530.81) GASTROPARESIS (ICD-536.3) DEGENERATIVE JOINT DISEASE (ICD-715.90) CHRONIC PAIN SYNDROME (ICD-338.4) ANXIETY (ICD-300.00)  Past Surgical History: S/P right shoulder surgery 11/05 by DrMurphy w/ arthroscopy, debridement, distal clavicle resection S/P ant cervical discectomy, decompression, foaminotomy, and fusion by DrBotero 4/06  Family History: Reviewed history from 11/03/2008 and no changes required. Father died age 38 w/ "lung disease", smoker... Mother alive age 66 w/ known ASHD... No Siblings  Social History: Reviewed history from 10/12/2009 and no changes required. 2nd Marriage, 22 yrs... 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  The patient denies anorexia, fever, weight loss, weight gain, vision loss, decreased hearing, hoarseness, syncope, peripheral edema, prolonged cough,  headaches, hemoptysis, abdominal pain, melena, hematochezia, severe indigestion/heartburn, hematuria, incontinence, muscle weakness, suspicious skin lesions, transient blindness, difficulty walking, depression, unusual weight change, abnormal bleeding, enlarged lymph nodes, and angioedema.    Vital Signs:  Patient profile:   50 year old male Height:      67 inches Weight:      176.38 pounds BMI:     27.72 O2 Sat:      95 % on Room air Temp:     97.5 degrees F oral Pulse rate:   78 / minute BP sitting:   110 / 68  (left arm) Cuff size:   regular  Vitals Entered By: Randell Loop CMA (August 19, 2010 11:56 AM)  O2 Sat at Rest %:  95 O2 Flow:  Room air CC: 8 month ROV & review of mult medical problems... Is Patient Diabetic? No Pain Assessment Patient in pain? no      Comments no changes in meds today   Physical Exam  Additional Exam:  WD, WN, 49  y/o WM in NAD... GENERAL:  Alert & oriented; pleasant & cooperative... HEENT:  [place]/AT, EOM-wnl, PERRLA, [place]-benign, EACs-clear, TMs-wnl, NOSE-clear, THROAT-clear & wnl. NECK:  Supple w/ decrROM; scar of CSpine surg; no JVD; normal carotid impulses w/o bruits;  no thyromegaly or nodules palpated; no lymphadenopathy. CHEST:  Clear to P & A; without wheezes/ rales/ or rhonchi. HEART:  Regular Rhythm; without murmurs/ rubs/ or gallops. ABDOMEN:  Soft & nontender; normal bowel sounds; no organomegaly or masses detected. RECTAL:  Neg - prostate 2+ & nontender w/o nodules; stool hematest neg. EXT: without deformities, mild arthritic changes; no varicose veins/ venous insuffic/ or edema. NEURO:  CN's intact; motor testing normal; sensory testing normal; gait normal & balance OK. DERM:  No lesions noted; no rash etc...    MISC. Report  Procedure date:  08/19/2010  Findings:      BMP (METABOL)   Sodium                    144 mEq/L                   135-145   Potassium                 4.9 mEq/L                   3.5-5.1   Chloride                   105 mEq/L                   96-112   Carbon Dioxide            29 mEq/L                    19-32   Glucose              [H]  107 mg/dL                   11-91   BUN                       23 mg/dL                    4-78   Creatinine                1.1 mg/dL                   2.9-5.6   Calcium                   10.3 mg/dL                  2.1-30.8   GFR                       77.26 mL/min                >60.00  Lipid Panel (LIPID)   Cholesterol          [H]  227 mg/dL                   6-578   Triglycerides             128.0 mg/dL                 4.6-962.9   HDL  40.90 mg/dL                 >60.45  Cholesterol LDL - Direct                             151.4 mg/dL  Testosterone, Total (TESTO)   Testosterone              410.53 ng/dL                409.81-191.47   Impression & Recommendations:  Problem # 1:  HYPERTENSION, BENIGN (ICD-401.1) BP controlled>  continue meds, keep wt down, no salt. His updated medication list for this problem includes:    Losartan Potassium 100 Mg Tabs (Losartan potassium) .Marland Kitchen... Take 1 tab by mouth once daily...  Orders: TLB-Lipid Panel (80061-LIPID) TLB-Testosterone, Total (84403-TESTO)  Problem # 2:  CHEST PAIN, ATYPICAL, HX OF (ICD-V15.89) He has chr pain syndrome managed by [facility]...  Problem # 3:  HYPERLIPIDEMIA (ICD-272.4) FLP today shows sl improved TGs w/ the wt loss & continued Fenofib, but worsening Chol/ LDL;  needs low chol/ low fat diet & may need Statin added... His updated medication list for this problem includes:    Fenofibrate 160 Mg Tabs (Fenofibrate) .Marland Kitchen... Take 1 tab by mouth once daily...  Problem # 4:  GU>>> We will screen w/ Testosterone level & Rx ED w/ Cialis trial in the interim...  Problem # 5:  CHRONIC PAIN SYNDROME (ICD-338.4) Followed at [facility]...  Problem # 6:  ANXIETY (ICD-300.00) We refilled his Alpraz per request... His updated medication list for  this problem includes:    Alprazolam 0.5 Mg Tabs (Alprazolam) .Marland Kitchen... Take 1 tablet by mouth three times a day as needed for nerves...    Celexa 10 Mg Tabs (Citalopram hydrobromide) .Marland Kitchen... Take as directed by pain management...  Complete Medication List: 1)  Adult Aspirin Low Strength 81 Mg Tbdp (Aspirin) .... Take 1 tablet by mouth once a day 2)  Losartan Potassium 100 Mg Tabs (Losartan potassium) .... Take 1 tab by mouth once daily.Marland KitchenMarland Kitchen 3)  Fenofibrate 160 Mg Tabs (Fenofibrate) .... Take 1 tab by mouth once daily.Marland KitchenMarland Kitchen 4)  Prilosec Otc 20 Mg Tbec (Omeprazole magnesium) .... Take 1 tablet by mouth once a day 5)  Alprazolam 0.5 Mg Tabs (Alprazolam) .... Take 1 tablet by mouth three times a day as needed for nerves... 6)  Oxycodone-acetaminophen 7.5-500 Mg Tabs (Oxycodone-acetaminophen) .... Take as directed by pain management... 7)  Zanaflex 4 Mg Caps (Tizanidine hcl) .... Take as directed by pain management... 8)  Celexa 10 Mg Tabs (Citalopram hydrobromide) .... Take as directed by pain management.Marland KitchenMarland Kitchen 9)  Cialis 20 Mg Tabs (Tadalafil) .... Take as directed...  Other Orders: TLB-BMP (Basic Metabolic Panel-BMET) (80048-METABOL)  Patient Instructions: 1)  Today we updated your med list- see below.... 2)  We refilled your meds per request... 3)  Today we did your follow up fasting blood work... 4)  please call the "phone tree" in a few days for your lab results.Marland KitchenMarland Kitchen  5)  Call for any questions.Marland KitchenMarland Kitchen 6)  Please schedule a follow-up appointment in 6 months. Prescriptions: CIALIS 20 MG TABS (TADALAFIL) take as directed...  #10 x prn   Entered and Authorized by:   Michele Mcalpine MD   Signed by:   Michele Mcalpine MD on 08/19/2010   Method used:   Print then Give to Patient   RxID:   [id number]  ALPRAZOLAM 0.5 MG  TABS (ALPRAZOLAM) Take 1 tablet by mouth three times a day as needed for nerves...  #100 x 6   Entered and Authorized by:   Michele Mcalpine MD   Signed by:   Michele Mcalpine MD on 08/19/2010    Method used:   Print then Give to Patient   RxID:   [id number] FENOFIBRATE 160 MG TABS (FENOFIBRATE) take 1 tab by mouth once daily...  #30 x 12   Entered and Authorized by:   Michele Mcalpine MD   Signed by:   Michele Mcalpine MD on 08/19/2010   Method used:   Print then Give to Patient   RxID:   [id number] LOSARTAN POTASSIUM 100 MG TABS (LOSARTAN POTASSIUM) take 1 tab by mouth once daily...  #30 x 12   Entered and Authorized by:   Michele Mcalpine MD   Signed by:   Michele Mcalpine MD on 08/19/2010   Method used:   Print then Give to Patient   RxID:   [id number]    Immunization History:  Influenza Immunization History:    Influenza:  declined (08/19/2010)  Pneumovax Immunization History:    Pneumovax:  declined (08/19/2010)

## 2010-10-14 NOTE — Progress Notes (Signed)
Summary: rx  Phone Note Call from Patient Call back at [phone]   Caller: Patient Call For: nadel Reason for Call: Acute Illness, Talk to Nurse Complaint: Cough/Sore throat Summary of Call: cough, congestion, runny nose, non-productive.  Spoke to doc on call, would like a zpac if possible. [facility] [address] Initial call taken by: Eugene Gavia,  October 12, 2009 8:36 AM  Follow-up for Phone Call        pt last seen 10-2008----he will need to come in for appt with TP since SN is out of the office today.  thanks Randell Loop CMA  October 12, 2009 9:04 AM  pt scheduled to see TP today at 11:30. Carron Curie CMA  October 12, 2009 9:21 AM

## 2010-10-14 NOTE — Assessment & Plan Note (Signed)
Summary: Acute NP office visit - bronchitis   CC:  sinus pressure/congestion with clear drainage, sore throat, PND with dry cough x3days - denies dyspnea, wheezing, and f/c/s.  History of Present Illness:  50 yo male with known hx of   hyperlipidemia, GERD, HTN, Allergic rhinitis    October 12, 2009--Presents for an acute office visit. Complains of sinus pressure/congestion with clear drainage, sore throat, PND with dry cough x3days. Very concerned this will end up with asthma flare. Denies chest pain,   orthopnea, hemoptysis, fever, n/v/d, edema, headache.   Medications Prior to Update: 1)  Adult Aspirin Low Strength 81 Mg  Tbdp (Aspirin) .... Take 1 Tablet By Mouth Once A Day 2)  Diovan 80 Mg  Tabs (Valsartan) .... Take 1 Tablet By Mouth Once A Day 3)  Tricor 145 Mg Tabs (Fenofibrate) .... Take 1 By Mouth Once Daily 4)  Prilosec Otc 20 Mg  Tbec (Omeprazole Magnesium) .... Take 1 Tablet By Mouth Once A Day 5)  Alprazolam 0.5 Mg  Tabs (Alprazolam) .... Take 1 Tablet By Mouth Three Times A Day As Needed For Nerves... 6)  Oxycodone-Acetaminophen 7.5-500 Mg  Tabs (Oxycodone-Acetaminophen) .... Take As Directed By Pain Management... 7)  Zanaflex 4 Mg Caps (Tizanidine Hcl) .... Take As Directed By Pain Management... 8)  Celexa 10 Mg Tabs (Citalopram Hydrobromide) .... Take As Directed By Pain Management.Marland KitchenMarland Kitchen 9)  Nexium 40 Mg Cpdr (Esomeprazole Magnesium) 10)  Nexium 40 Mg Cpdr (Esomeprazole Magnesium)  Current Medications (verified): 1)  Adult Aspirin Low Strength 81 Mg  Tbdp (Aspirin) .... Take 1 Tablet By Mouth Once A Day 2)  Diovan 80 Mg  Tabs (Valsartan) .... Take 1 Tablet By Mouth Once A Day 3)  Tricor 145 Mg Tabs (Fenofibrate) .... Take 1 By Mouth Once Daily 4)  Prilosec Otc 20 Mg  Tbec (Omeprazole Magnesium) .... Take 1 Tablet By Mouth Once A Day 5)  Alprazolam 0.5 Mg  Tabs (Alprazolam) .... Take 1 Tablet By Mouth Three Times A Day As Needed For Nerves... 6)  Oxycodone-Acetaminophen  7.5-500 Mg  Tabs (Oxycodone-Acetaminophen) .... Take As Directed By Pain Management... 7)  Zanaflex 4 Mg Caps (Tizanidine Hcl) .... Take As Directed By Pain Management... 8)  Celexa 10 Mg Tabs (Citalopram Hydrobromide) .... Take As Directed By Pain Management...  Allergies (verified): 1)  ! Bethanne Ginger  Past History:  Past Surgical History: Last updated: 11/28/2008 S/P right shoulder surgery 11/05 by DrMurphy w/ arthroscopy, debridement, distal clavicle resection. S/P ant cervical discectomy, decompression, foaminotomy, and fusion by [facility] 4/06.  Family History: Last updated: 11-28-08 Father died age 82 w/ "lung disease", smoker... Mother alive age 33 w/ known ASHD... No Siblings  Social History: Last updated: 10/12/2009 2nd Marriage, 22 yrs... 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 he was prev eval by DrESL in 2001 w/ mult meds- Advair, Zyrtek, Nasonex, Astelin, etc...  ~  CXR 2/10 showed normal heart size & clear lungs...  HYPERTENSION, BENIGN (ICD-401.1) - on DIOVAN 80mg /d & low sodium diet...   CHEST PAIN, ATYPICAL, HX OF (ICD-V15.89) & PALPITATIONS, HX OF (ICD-V12.50) - on ASA 81mg /d... he had a cardiac eval in the 90's for these symptoms and mult risk factors including +FamHx by DrGrodecki w/ baseline EKG showing NSSTTWA;  2DEcho  was neg w/ norm LV size & function- EF= 60%, no MVP, mild MR;  StressTest was neg x for occas PVC's;  Event Recorder w/ lots of symptoms but only occas PVC's and PAC';  treated w/ rec for smoking cessation, no caffeine, Tranxene Prn...  ~  NuclearStressTest 2005 was neg- no ischemia, no infarct... EF= 54%...  ~  He saw several Orthopedists,DrZ for Rheum, & DrWeymann (neg EMG) in 2005 due to his  atyp CWP...  HYPERLIPIDEMIA (ICD-272.4) - on FEOFIBRATE 160mg /d + low chol/ low fat diet...  ~  FLP 9/07 (wt= 178#) showed TChol 197, TG 408, HDL 31, LDL 93... Fibrate Rx started w/ Tricor.  ~  FLP 2/10 (wt= 186#) showed TChol 201, TG 278, HDL 30, LDL 134... needs better diet & may need statin added.     Social History: 2nd Marriage, 22 yrs... 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 No Comments Medications reviewed with patient Daytime contact number verified with patient. Boone Master CNA  October 12, 2009 11:31 AM    Physical Exam  Additional Exam:  WD, WN, 50 /o WM in NAD... GENERAL:  Alert & oriented; pleasant & cooperative... HEENT:  [place]/AT, EOM-wnl, PERRLA,   EACs-clear, TMs-wnl, NOSE-pale mucosa.  THROAT-clear & wnl. NECK:  Supple w/ decrROM; scar of CSpine surg; no JVD; normal carotid impulses w/o bruits;  no thyromegaly or nodules palpated; no lymphadenopathy. CHEST:  Clear to P & A; without wheezes/ rales/ or rhonchi. HEART:  Regular Rhythm; without murmurs/ rubs/ or gallops. ABDOMEN:  Soft & nontender; normal bowel sounds; no organomegaly or masses detected.  EXT: without deformities, mild arthritic changes; no varicose veins/ venous insuffic/ or edema.     Impression & Recommendations:  Problem # 1:  UPPER RESPIRATORY INFECTION, ACUTE  (ICD-465.9)  Zpack take as directed.  Mucinex DM two times a day as needed cough/congestion  Increase fluids, and rest.  Tylenol as needed  follow up Dr. Kriste Basque in 2-3 months for physical.  His updated medication list for this problem includes:    Adult Aspirin Low Strength 81 Mg Tbdp (Aspirin) .Marland Kitchen... Take 1 tablet by mouth once a day  Orders: Est. Patient Level III (65784)  Medications Added to Medication List This Visit: 1)  Zithromax Z-pak 250 Mg Tabs (Azithromycin) .... Take as directed.  Complete Medication List: 1)  Adult Aspirin Low Strength 81 Mg Tbdp (Aspirin) .... Take 1 tablet by mouth once a day 2)  Diovan 80 Mg Tabs (Valsartan) .... Take 1 tablet by mouth once a day 3)  Tricor 145 Mg Tabs (Fenofibrate) .... Take 1 by mouth once daily 4)  Prilosec Otc 20 Mg Tbec (Omeprazole magnesium) .Marland KitchenMarland KitchenMarland Kitchen  Take 1 tablet by mouth once a day 5)  Alprazolam 0.5 Mg Tabs (Alprazolam) .... Take 1 tablet by mouth three times a day as needed for nerves... 6)  Oxycodone-acetaminophen 7.5-500 Mg Tabs (Oxycodone-acetaminophen) .... Take as directed by pain management... 7)  Zanaflex 4 Mg Caps (Tizanidine hcl) .... Take as directed by pain management... 8)  Celexa 10 Mg Tabs (Citalopram hydrobromide) .... Take as directed by pain management.Marland KitchenMarland Kitchen 9)  Zithromax Z-pak 250 Mg Tabs (Azithromycin) .... Take as directed.  Patient Instructions: 1)  Zpack take as directed.  2)  Mucinex DM two times a day as needed cough/congestion  3)  Increase fluids, and rest.  4)  Tylenol as needed  5)  follow up Dr. Kriste Basque in 2-3 months for physical.  Prescriptions: ZITHROMAX Z-PAK 250 MG TABS (AZITHROMYCIN) take as directed.  #1 x 0   Entered and Authorized by:   Rubye Oaks NP   Signed by:   Rubye Oaks NP on 10/12/2009   Method used:   [facility]        [facility]  [address] [phone][facility] (retail)       [address]       [place], [place]  [id number]       Ph: [phone] or  [phone]       Fax: [phone]   RxID:   [id number]    Immunization History:  Influenza Immunization History:    Influenza:  historical (06/12/2004)    Influenza:  declines (10/12/2009)  Pneumovax Immunization History:    Pneumovax:  historical (10/12/2009)

## 2011-01-07 ENCOUNTER — Telehealth: Payer: Self-pay | Admitting: *Deleted

## 2011-01-07 NOTE — Telephone Encounter (Signed)
Called, spoke with pt.  States he is taking xanax 0.5mg  tid but despite this he is having increased anxiety and panic attacks x 4-5 months.  Denies any change in stress level or personal problems.  Requesting the strength to be increased.   Last OV 08/19/10 Pending OV 02/15/11 nkda - [facility] [address] Dr. Kriste Basque, pls advise.  Thanks!

## 2011-01-07 NOTE — Telephone Encounter (Signed)
LMTCB

## 2011-01-07 NOTE — Telephone Encounter (Signed)
Per SN--my limit on xanax in 0.5mg   Po four times daily  #120/mo   , if this isnt enough then will need psychiatry consult---we can  1.  Increase the xanax to 0.5mg   Four times daily prn  Or    2.  Refer to psychiatry.  thanks

## 2011-01-11 NOTE — Telephone Encounter (Signed)
Pt states he is already taking xanax four times daily as needed and says he also see psychiatrist but could not give name of that doctor. He said that this was okay and we did not need to do anything further. He will follow-up with the pain clinic because his anxiety is coming from the pain.

## 2011-01-28 NOTE — Op Note (Signed)
NAME:  YOGESH, COMINSKY NO.:  [id number]   MEDICAL RECORD NO.:  [id number]          PATIENT TYPE:  OIB   LOCATION:  2857                         FACILITY:  [facility]   PHYSICIAN:  Hilda Lias, M.D.   DATE OF BIRTH:  1960/11/17   DATE OF PROCEDURE:  12/15/2004  DATE OF DISCHARGE:                                 OPERATIVE REPORT   PREOPERATIVE DIAGNOSIS:  C3-C4 spondylosis with a herniated disk centered to  the right. Chronic neck pain.   POSTOPERATIVE DIAGNOSIS:  C3-C4 spondylosis with a herniated disk centered  to the right. Chronic neck pain.   PROCEDURE:  Anterior cervical diskectomy, decompression of the spinal cord,  foraminotomy, interbody fusion with allograft plate, microscope.   SURGEON:  Hilda Lias, M.D.   ASSISTANT:  Hewitt Shorts, M.D.   CLINICAL HISTORY:  The patient is being admitted because of neck pain with  radiation to the shoulder with some chest pain. The patient had complete  workup including MRI of the whole spine. It was found that he has a  herniated disk at the level of C3-4. The patient had three injections in the  cervical area with no improvement. Neck brace was given and the patient got  almost 100% relief of the pain. Because the patient is no better without the  brace, surgery was advised. The risks were explained in the history and  physical.   PROCEDURE:  The patient was taken to the OR, and after intubation, the left  side of the neck was sprayed with Betadine. The skin was prepped with  Hibiclens. Then, a transverse incision was made through the skin, platysma,  down to cervical spine. We found a large osteophyte which was at the level  of C4 which was proved by x-ray. Then, the osteophyte was removed with the  __________ . We brought the microscope into the area. Calcified anterior  ligament was also excised, and we entered the disk space. The patient had  quite a bit of degenerative disk disease with quite a bit of  softening of  the vertebral bodies. Total diskectomy was achieved. We opened the posterior  ligament. We found three or four fragments going to the right side. A thick  posterior ligament was also excised. Foraminotomy with decompression of the  spinal cord was made. Then, the endplate was drilled, and a 7-mm graft was  inserted followed by a plate with 4 screws. The second x-rays showed good  position of the plate and the screws.  __________  irrigated. Hemostasis was done with bipolar. We wanted over 10  minutes just to be sure that he had no bleeding. Having good hemostasis, the  area was closed with Vicryl and Steri-Strips. The patient is going to go to  PACU.      EB/MEDQ  D:  12/15/2004  T:  12/15/2004  Job:  [id number]   cc:   Loreta Ave, M.D.  [address]  [place] [address]  Fax: [phone]

## 2011-01-28 NOTE — Discharge Summary (Signed)
NAME:  Edward Chen, Edward Chen                          ACCOUNT NO.:  [id number]   MEDICAL RECORD NO.:  [id number]                   PATIENT TYPE:  INP   LOCATION:  2005                                 FACILITY:  [facility]   PHYSICIAN:  Rene Paci, M.D. [facility]          DATE OF BIRTH:  11/23/1960   DATE OF ADMISSION:  02/21/2004  DATE OF DISCHARGE:  02/23/2004                                 DISCHARGE SUMMARY   DISCHARGE DIAGNOSES:  1. Chest pain.  2. Myocardial infarction ruled out.  3. Hypertriglyceridemia.  4. Hypertension.  5. Glucose intolerance.  6. Tobacco use.   BRIEF ADMISSION HISTORY:  Mr. Rachal is a 50 year old white male who  presented with a several-week history of exertional chest pain with  radiation to the shoulders, relieved with rest.  The patient states he was  mowing the yard, pulling and pushing objects and walking up steps when he  chest pain occurs.  He states that is relieved within 30 minutes after rest.   PAST MEDICAL HISTORY:  1. Asthma.  2. Anxiety.  3. Gastroesophageal reflux disease.  4. Hypertension.  5. Adult-onset diabetes mellitus.  6. Hypertriglyceridemia.   HOSPITAL COURSE:  PROBLEM #1 - CARDIOVASCULAR:  The patient was admitted  with chest pain.  Serial cardiac enzymes were negative.  EKG was without  ischemia.  The patient did rule out for myocardial infarction.  The patient  was scheduled for a Cardiolite, which was performed on February 23, 2004 and was  negative for ischemia.  The patient was stable for discharge.   PROBLEM #2 - DYSLIPIDEMIA:  The patient's triglycerides were elevated at  379.  The patient is not currently on any lipid-lowering medications; we  will defer to his primary care physician.   PROBLEM #3 - HYPERTENSION:  The patient's blood pressure was stable.   PROBLEM #4 - GLUCOSE INTOLERANCE:  This was mild and he did have a normal  hemoglobin A1c.   PROBLEM #5 - TOBACCO USE:  Smoking cessation was encouraged on admission.   LABORATORIES AT DISCHARGE:  CBC was normal.  Coagulations were normal.  BUN  14, creatinine 1.1 and glucose was 131.  Hemoglobin A1c was 4.9%.  Total  cholesterol 157, triglycerides 379, HDL 30, LDL 51.   MEDICATIONS AT DISCHARGE:  1. Tranxene 7.5 mg daily.  2. Singulair 10 mg daily.  3. Nexium 20 mg daily.  4. Diovan 80 mg daily.  5. Nasonex as at home.  6. The patient was advised to use Advil or Naprosyn as needed.   FOLLOWUP:  Follow up with Dr. Lonzo Cloud. Nadel in the next week.      Cornell Barman, P.A. [facility]                  Rene Paci, M.D. [facility]    LC/MEDQ  D:  03/17/2004  T:  03/18/2004  Job:  [id number]   cc:   Lonzo Cloud. Kriste Basque,  M.D. [facility]

## 2011-01-28 NOTE — Assessment & Plan Note (Signed)
[facility]                           GASTROENTEROLOGY OFFICE NOTE   Edward Chen, Edward Chen                       MRN:          [id number]  DATE:07/18/2006                            DOB:          Sep 09, 1961    PROBLEM:  Dysphagia.   REASON FOR VISIT:  Edward Chen has returned complaining of dysphagia.  Approximately a year ago he underwent cervical spine surgery.  Since that  time he is complaining of pain upon swallowing in his lower left neck on the  side of his surgery.  He has dysphagia that is worse when he lies on his  left side.  He denies odynophagia.  He rarely has pyrosis.  He does have a  history of esophagitis.   MEDICATIONS:  Singulair, Diovan, baby aspirin, Zanaflex, Lexapro.   PHYSICAL EXAMINATION:  VITAL SIGNS:  Pulse 70, blood pressure 110/76.  Weight 179.  HEENT:  Within normal limits.  CHEST:  Clear.  CARDIAC:  No cardiac murmurs, gallops or rubs.   IMPRESSION:  Dysphagia.  I suspect this may be due to partial encroachment  on his cervical esophagus.  A fixed esophageal stricture in the distal  esophagus can sometimes present in a similar manner.   RECOMMENDATION:  Barium swallow.     Barbette Hair. Arlyce Dice, MD,FACG  Electronically Signed    RDK/MedQ  DD: 07/18/2006  DT: 07/19/2006  Job #: [id number]   cc:   Hilda Lias, M.D.

## 2011-01-28 NOTE — Consult Note (Signed)
NAME:  Edward Chen, Edward Chen                          ACCOUNT NO.:  [id number]   MEDICAL RECORD NO.:  [id number]                   PATIENT TYPE:  INP   LOCATION:  2005                                 FACILITY:  [facility]   PHYSICIAN:  Creta Levin, M.D. [facility]      DATE OF BIRTH:  September 16, 1960   DATE OF CONSULTATION:  02/21/2004  DATE OF DISCHARGE:                                   CONSULTATION   Consult was requested by Dr. Lovell Sheehan for chest pain.  His primary is Dr.  Alroy Dust.   CHIEF COMPLAINT:  Chest pain.   HISTORY:  Edward Chen is a 50 year old male with a history of hypertension  and glucose intolerance, who complains of chest pain with exertion for  approximately 2 weeks.  His symptoms began after stretching over the side of  his pool when he felt a pop in his right shoulder.  Since then he has had 6-  8 episodes of exertional chest and shoulder discomfort.  His most recent  episode was this morning while walking.  The pain radiated to the left and  to the right shoulder.  It was not associated with dyspnea, diaphoresis,  nausea, palpitations, or presyncope.   PAST MEDICAL HISTORY:  1. Hypertension.  2. Glucose intolerance.  3. Asthma.  4. Reflux.  5. Anxiety.  6. History of appendectomy.   ALLERGIES:  No known drug allergies.   MEDICATIONS:  1. Diovan 80 mg daily.  2. Singulair.  3. Clorazepate.  4. Nexium.   SOCIAL HISTORY:  He has a 10-pack-year smoking history and currently smokes  about 1/2 pack per day.  He drinks 6-8 beers per day.  He works as a Water engineer.   FAMILY HISTORY:  His mother has multiple medical problems but no coronary  disease as far as he knows.  Father had coronary disease in his 75s.  He has  one brother who died of an overdose and another brother who is healthy.   REVIEW OF SYSTEMS:  Positive for chest pain/chest wall pain.  He also has  some anxiety.  The remainder of his 10-point review is negative.   PHYSICAL EXAM:  VITAL SIGNS:   Temperature is 98.1; blood pressure 171/106;  heart rate 100; respiratory rate 16; he is saturating 100% on room air.  GENERAL:  He is a very anxious male in no acute distress.  HEENT:  Unremarkable.  NECK:  No JVD, no bruits.  LUNGS:  Clear and without wheezing.  CARDIOVASCULAR:  Regular rate and rhythm without murmur, rub, or gallop.  ABDOMINAL:  Was normal.  EXTREMITIES:  No cyanosis, clubbing or edema.   Chest x-ray showed no acute disease.  ECG:  Normal sinus rhythm at 94.  No  evidence of ischemia or injury.  The I-Stat labs:  The hematocrit was 43.  Chem-7 was unremarkable.  The CK-MB was 1.0 and the troponin was less than  0.5.   ASSESSMENT AND  PLAN:  1. Chest pain.  Likely, this is noncardiac in origin.  I agree with ruling     him out for myocardial infarction.  I have held his beta blocker in     anticipation of an exercise Cardiolite.  An exercise Cardiolite would be     preferable, but if he is unable to reach his target heart rate, then     dobutamine can be administered.  I would check his __________  panel, as     well as a hemoglobin A1c.  If his LDL is greater than 130, then he     probably would benefit from statin therapy for primary prevention.  2. Anxiety and excessive alcohol use.  The patient takes chronic     benzodiazepines.  In order to prevent delirium tremens, I will give him     an Ativan taper and also start him on thiamine, folate, and a     multivitamin.                                               Creta Levin, M.D. [facility]    RPK/MEDQ  D:  02/21/2004  T:  02/22/2004  Job:  [id number]   cc:   Lovell Sheehan, Dr.   Lonzo Cloud. Kriste Basque, M.D. [facility]

## 2011-01-28 NOTE — Op Note (Signed)
NAME:  Edward Chen, Edward Chen NO.:  [id number]   MEDICAL RECORD NO.:  [id number]          PATIENT TYPE:  AMB   LOCATION:  DSC                          FACILITY:  [facility]   PHYSICIAN:  Loreta Ave, M.D. 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  Question superior labral  anteroposterior lesion.   POSTOPERATIVE DIAGNOSIS:  1.  Post traumatic adhesive capsulitis impingement distal clavicle      osteolysis right shoulder.  Question superior labral anteroposterior      lesion.  2.  Superior labrum tear but no true superior labral anteroposterior lesion.   PROCEDURE:  Right shoulder examination and manipulation under anesthesia.  Arthroscopy with debridement of intra- and extra-articular lesions.  Acromioplasty with CA ligament release.  Excision distal clavicle.   SURGEON:  Loreta Ave, M.D.   ASSISTANT:  Genene Churn. Denton Meek.   ANESTHESIA:  General anesthesia.   ESTIMATED BLOOD LOSS:  Minimal.   SPECIMENS:  None.   CULTURES:  None.   COMPLICATIONS:  None.   DRESSING:  Soft compressive with sling.   DESCRIPTION OF PROCEDURE:  The patient brought to the operating room and  after adequate anesthesia had been obtained, right shoulder examined.  Forward flexion at 90 degrees and abduction with some limited rotation.  Gently manipulated, breaking up of relatively mild adhesions, achieving full  motion, stable shoulder.  No evidence of instability.  Placed in beach chair  position and shoulder positioner.  Prepped and draped in usual sterile  fashion.  Three portals, anterior, posterior and lateral.  Shoulder entered  with arthroscopeand inspected.  Significant synovitis and intra-articular  adhesions all debrided.  Articular cartilage looked good except for  a small  chondral injury posterior aspect of the humeral head similar to Hill-Sachs  lesion but not as graphic and not as extreme.  Also no significant pattern  of instability to visualization or examination.  Some complex tearing  superior labrum debrided.  Biceps tendon, biceps anchor firmly intact.  No  significant SLAP lesion seen.  Under surface rotator cuff intact.  Adhesions  throughout the shoulder well debrided but capsular ligamentous structures  were intact.  Cannula redirected subacromially.  Typical post traumatic  impingement.  Marked reactive bursitis, type III acromion and abrasive  changes on top of the cuff.  Grade III chondral changes with spurring AC  joint.  Cuff debrided, bursa resected.  Acromioplasty to a type I acromion  releasing CA ligament.  Distal clavicle exposed lateral centimeter resected.  Adequacy of decompression debridement, clavicle excision and acromioplasty  confirmed viewing from all portals.  Instruments and fluid removed.  Portals  of shoulder and bursa injected with Marcaine.  Portals were closed with 4-0  nylon.  Sterile compressive dressing applied.  Anesthesia reversed.  Brought  to the recovery room.  The patient tolerated the procedure well with no  complications.      Dani  DFM/MEDQ  D:  07/14/2004  T:  07/14/2004  Job:  [id number]

## 2011-01-28 NOTE — H&P (Signed)
NAME:  Edward Chen, Edward Chen NO.:  [id number]   MEDICAL RECORD NO.:  [id number]          PATIENT TYPE:  OIB   LOCATION:  2857                         FACILITY:  [facility]   PHYSICIAN:  Hilda Lias, M.D.   DATE OF BIRTH:  09/08/1961   DATE OF ADMISSION:  12/15/2004  DATE OF DISCHARGE:                                HISTORY & PHYSICAL   Edward Chen is a gentleman who had been seen by me about three months ago  because of neck pain with radiation down to the base of the neck and in the  chest wall, feeling of some pressure in the chest wall every time he moved  his neck.  The patient was seen by orthopedic surgeon.  He had a cervical  MRI from C1 all the way down to second and it was found he has a herniated  disk and spondylosis at the level 3-4.  Patient has had conservative  treatment including selective nerve root injection without any better.  After the injection in view that he is still not any better, he wants to  proceed with surgery.  When he came to see me in my office a few days ago he  was complaining of quite a bit of neck pain.  Cervical collar was given and  five minutes later the pain went away.  In view of that he wanted to proceed  with surgery.   PAST MEDICAL HISTORY:  Shoulder surgery twice.  He is __________ medication,  although he is allergic to [facility].   SOCIAL HISTORY:  He drinks socially.  He is smoker of four to five packs a  day.   FAMILY HISTORY:  Unremarkable.   REVIEW OF SYSTEMS:  Positive for chest pain, asthma, weakness, and leg pain.   PHYSICAL EXAMINATION:  GENERAL:  Patient came to my office and he was  complaining of decrease in flexibility, pain with movement of the neck.  HEENT:  Normal.  NECK:  He is able to flex, extend, _________ both shoulders.  CARDIOVASCULAR:  Normal.  LUNGS:  Bilateral wheezing.  ABDOMEN:  Normal.  EXTREMITIES:  The patient has scarring in shoulder from previous surgery.  NEUROLOGIC:  Mental status  normal.  Cranial nerves normal.  Strength is 5/5  with questionable weakness of the deltoid.  Reflexes symmetrical.  Sensation  normal.   The MRI showed that he has a herniated disk with spondylosis at the level C3-  C4.   CLINICAL IMPRESSION:  C3-C4 herniated disk.   RECOMMENDATIONS:  The patient is going to be admitted for anterior cervical  disk at the level 3-4.  I talked to him at length.  I explained the risks  such as infection, CSF leak, worsening pain, paralysis, need for further  surgery, damage to the vertebral and carotid artery, and the possibility of  decompression _________.  He declined more opinion.      EB/MEDQ  D:  12/15/2004  T:  12/15/2004  Job:  [id number]

## 2011-02-03 ENCOUNTER — Encounter: Payer: Self-pay | Admitting: Pulmonary Disease

## 2011-02-15 ENCOUNTER — Telehealth: Payer: Self-pay | Admitting: Pulmonary Disease

## 2011-02-15 ENCOUNTER — Other Ambulatory Visit (INDEPENDENT_AMBULATORY_CARE_PROVIDER_SITE_OTHER): Payer: Medicare Other

## 2011-02-15 ENCOUNTER — Other Ambulatory Visit: Payer: Self-pay | Admitting: Pulmonary Disease

## 2011-02-15 ENCOUNTER — Ambulatory Visit: Payer: Self-pay | Admitting: Pulmonary Disease

## 2011-02-15 DIAGNOSIS — Z0000 Encounter for general adult medical examination without abnormal findings: Secondary | ICD-10-CM

## 2011-02-15 LAB — CBC WITH DIFFERENTIAL/PLATELET
Basophils Relative: 0.7 % (ref 0.0–3.0)
Eosinophils Absolute: 0.2 10*3/uL (ref 0.0–0.7)
HCT: 42.5 % (ref 39.0–52.0)
Hemoglobin: 15 g/dL (ref 13.0–17.0)
Lymphocytes Relative: 25.6 % (ref 12.0–46.0)
MCHC: 35.2 g/dL (ref 30.0–36.0)
MCV: 91.5 fl (ref 78.0–100.0)
Monocytes Absolute: 0.4 10*3/uL (ref 0.1–1.0)
Neutro Abs: 4.2 10*3/uL (ref 1.4–7.7)
RBC: 4.64 Mil/uL (ref 4.22–5.81)

## 2011-02-15 LAB — LIPID PANEL
Cholesterol: 169 mg/dL (ref 0–200)
Total CHOL/HDL Ratio: 5
Triglycerides: 284 mg/dL — ABNORMAL HIGH (ref 0.0–149.0)
VLDL: 56.8 mg/dL — ABNORMAL HIGH (ref 0.0–40.0)

## 2011-02-15 LAB — BASIC METABOLIC PANEL
Calcium: 9.6 mg/dL (ref 8.4–10.5)
Creatinine, Ser: 1.2 mg/dL (ref 0.4–1.5)

## 2011-02-15 LAB — HEPATIC FUNCTION PANEL
Bilirubin, Direct: 0.1 mg/dL (ref 0.0–0.3)
Total Bilirubin: 1 mg/dL (ref 0.3–1.2)

## 2011-02-15 LAB — HEMOGLOBIN A1C: Hgb A1c MFr Bld: 5.6 % (ref 4.6–6.5)

## 2011-02-15 NOTE — Telephone Encounter (Signed)
Labs are in the computer and pt is aware to come in fasting for these labs.

## 2011-02-27 ENCOUNTER — Other Ambulatory Visit: Payer: Self-pay | Admitting: Pulmonary Disease

## 2011-03-01 ENCOUNTER — Telehealth: Payer: Self-pay | Admitting: Pulmonary Disease

## 2011-03-01 MED ORDER — ALPRAZOLAM 0.5 MG PO TABS: 0.5000 mg | ORAL_TABLET | Freq: Three times a day (TID) | ORAL | Status: DC | PRN

## 2011-03-01 NOTE — Telephone Encounter (Signed)
LMTCB

## 2011-03-01 NOTE — Telephone Encounter (Signed)
Pt returning call.Edward Chen ° °

## 2011-03-01 NOTE — Telephone Encounter (Signed)
Pt requesting refill on xanax 0.5 tid prn. Last rx written on 08-19-10 #[id number] refills. Pt last seen on 08-19-10 with instruct to f/u in 6 months. Pt no show for appt on 02-15-11 and there is no pending appt. Please advise. Carron Curie, CMA

## 2011-03-01 NOTE — Telephone Encounter (Signed)
May have one refill only- will need ov to have any additional rx. Of xanax.

## 2011-03-01 NOTE — Telephone Encounter (Signed)
Spoke with pt and notified okay to refill med x 1 only and needs ov with SN. Pt verbalized understanding and appt was sched for 04/05/11 and med refilled x 1 only.

## 2011-03-25 ENCOUNTER — Other Ambulatory Visit: Payer: Self-pay | Admitting: Orthopedic Surgery

## 2011-03-25 DIAGNOSIS — M25512 Pain in left shoulder: Secondary | ICD-10-CM

## 2011-03-27 ENCOUNTER — Ambulatory Visit
Admission: RE | Admit: 2011-03-27 | Discharge: 2011-03-27 | Disposition: A | Discharge status: Home / Self Care | Payer: Medicare Other | Source: Ambulatory Visit | Attending: Orthopedic Surgery | Admitting: Orthopedic Surgery

## 2011-03-27 DIAGNOSIS — M25512 Pain in left shoulder: Secondary | ICD-10-CM

## 2011-04-05 ENCOUNTER — Encounter: Payer: Self-pay | Admitting: Pulmonary Disease

## 2011-04-05 ENCOUNTER — Ambulatory Visit (INDEPENDENT_AMBULATORY_CARE_PROVIDER_SITE_OTHER): Payer: Medicare Other | Admitting: Pulmonary Disease

## 2011-04-05 DIAGNOSIS — R7309 Other abnormal glucose: Secondary | ICD-10-CM

## 2011-04-05 DIAGNOSIS — G894 Chronic pain syndrome: Secondary | ICD-10-CM

## 2011-04-05 DIAGNOSIS — F411 Generalized anxiety disorder: Secondary | ICD-10-CM

## 2011-04-05 DIAGNOSIS — K219 Gastro-esophageal reflux disease without esophagitis: Secondary | ICD-10-CM

## 2011-04-05 DIAGNOSIS — Z9189 Other specified personal risk factors, not elsewhere classified: Secondary | ICD-10-CM

## 2011-04-05 DIAGNOSIS — K3184 Gastroparesis: Secondary | ICD-10-CM

## 2011-04-05 DIAGNOSIS — E785 Hyperlipidemia, unspecified: Secondary | ICD-10-CM

## 2011-04-05 DIAGNOSIS — I10 Essential (primary) hypertension: Secondary | ICD-10-CM

## 2011-04-05 DIAGNOSIS — M1990 Unspecified osteoarthritis, unspecified site: Secondary | ICD-10-CM

## 2011-04-05 NOTE — Progress Notes (Signed)
Subjective:    Patient ID: Edward Chen, male    DOB: 1961/03/14, 50 y.o.   MRN: [id number]  HPI 50 y/o WM here for a follow up visit... he has multiple medical problems as noted below...  he is followed for his chronic neck, chest, arm, etc pain at [facility]...  ~  December 11, 2009:  he's had a pretty good year- save for recent AB exac treated w/ ZPak, Mucinex, & resolved... he is requesting change to generics where poss due to cost... still followed in the [facility] by for his chr pain syndrome & neck problems on Percocet, Celexa, Zanaflex...  ~  August 19, 2010:  8 month ROV & doing reasonably well- c/o decr energy & drive, wonders about "Low-T" and tried OTC herbal Rx "horney goat weed" w/o benefit... we discussed checking testos level (norm at 411) & Rx w/ Cialis for ED... Breathing is stable w/o exac;  BP controlled on the Losartan;  persist CP, chr pain syndrome treated in the Pain clinic;  due for f/u FLP to check TGs;  we refilled our meds today.  ~  April 05, 2011:  52mo ROV & he is doing reasonably well but continues to have orthopedic problems> this time left shoulder pain w/ eval by DrMurphy- he plans surg in September: He also notes optometrist recently treating him for Blepharitis...    His breathing has been fine> no asthma exac or bronchitic infections; BP well controlled on meds; Denies CP, palpit, dizzy, SOB, cough, sputum, etc; He had been off his Fenofib but back on now w/ low chol/ low fat diet (labs 6/12 reviewed); Finally he continues on his pain med regimen per [facility]...   Problem List:   PHYSICAL EXAMINATION (ICD-V70.0) - pt didn't bring med bottles or list of his current medications today- he states that he knows his meds. ~  GI:  he has been followed by DrKaplan for EGD/ stricture dil 2007... needs routine colonoscopy. ~  GU:  no hx prostate problems;  PSA 6/12 = 0.58; c/o ED & Rx w/ Cialis trial. ~  Immunizations:   he has declined Flu vaccine & all vaccinations.  ASTHMATIC BRONCHITIS, ACUTE (ICD-466.0) - prev on Singulair but stopped due to $$; He was prev eval by DrESL in 2001 w/ mult meds- Advair, Zyrtek, Nasonex, Astelin, etc; He is asymptomatic w/o cough, sputum, SOB, etc... ~  CXR 2/10 showed normal heart size & clear lungs... ~  f/u CXR 4/11 showed clear lungs, heart at upper limits of norm, NAD.  HYPERTENSION, BENIGN (ICD-401.1) - on LOSARTAN 100mg /d & low sodium diet... BP= 110/70 today but not checking BP at home... denies HA, fatigue, visual changes, CP, palipit, dizziness, syncope, dyspnea, edema, etc...  CHEST PAIN, ATYPICAL, HX OF (ICD-V15.89) & PALPITATIONS, HX OF (ICD-V12.50) - on ASA 81mg /d...  he had a cardiac eval in the 90's for these symptoms and mult risk factors including +FamHx by DrGrodecki w/ baseline EKG showing NSSTTWA;  2DEcho was neg w/ norm LV size & function- EF= 60%, no MVP, mild MR;  StressTest was neg x for occas PVC's;  Event Recorder w/ lots of symptoms but only occas PVC's and PAC';  treated w/ rec for smoking cessation, no caffeine, Tranxene Prn... ~  NuclearStressTest 2005 was neg- no ischemia, no infarct... EF= 54%. ~  He saw several Orthopedists, DrZ for Rheum, & DrWeymann (neg EMG) in 2005 due to his atyp CWP. ~  EKG 4/11  showed NSR, WNL, NAD...  HYPERLIPIDEMIA (ICD-272.4) - on FENOFIBRATE 160mg /d + low chol/ low fat diet... ~  FLP 9/07 (wt= 178#) on diet alone showed TChol 197, TG 408, HDL 31, LDL 93... Fibrate Rx started w/ Tricor. ~  FLP 2/10 (wt= 186#) on Tricor145 showed TChol 201, TG 278, HDL 30, LDL 134... needs better diet & may need statin. ~  FLP 4/11 (wt=185#) on Tricor145 showed TChol 164, TG 263, HDL 33, LDL 53... change to FENOFIBRATE 160mg /d for $$. ~  FLP 12/11 (wt=177#) on Feno160 showed TChol 227, TG 128, HDL 41, LDL 151... TG is great but Chol is up, may need statin! ~  FLP 6/12 off Fenofibrate x99mo showed TChol 169, TG 284, HDL 35, LDL 112... Get  back on FENOFIBRATE 160mg /d.  DIABETES MELLITUS, BORDERLINE (ICD-790.29) - on diet Rx alone> we discussed diet + exercise program... ~  prev labs showed FBS= 121... diet Rx discussed. ~  labs 2/10 showed BS= 107, HgA1c= 5.6 ~  labs 4/11 showed BS= 112, A1c= 5.3 ~  labs 12/11 showed BS= 107 ~  Labs 6/12 showed BS= 113, A1c= 5.6  GERD (ICD-530.81) - followed by DrKaplan & eval in 2007 w/ Ba Swallow showed esoph stricture w/ dysphagia... last EGD 12/07 showed esophagitis & stricture dilated... he rec treatment w/ PPI & pt switched to PRILOSEC OTC 20mg /d...  DEGENERATIVE JOINT DISEASE (ICD-715.90) - he had right shoulder surgery 11/05 by DrMurphy w/ arthroscopy, debridement, distal clavicle resection. ~  7/12: he reports left shoulder problem w/ eval by drMurphy & plans for Surg 9/12...  CHRONIC PAIN SYNDROME (ICD-338.4) - he had C3-4 spondylosis w/ HNP requiring ant cervical discectomy, decompression, foaminotomy, and fusion by [facility] 4/06... he tells me he is followed in a chronic pain clinic ([facility]) and treated w/ PERCOCET 7.5mg  Prn, CELEXA 10mg /d, & ZANAFLEX 4mg  Prn...  ANXIETY (ICD-300.00) - on ALPRAZOLAM 0.5mg  Prn for nerves...   Past Surgical History  Procedure Date  . Right shoulder surgery 11/05    Arthroscopy, debridement, distal resection  . Cervical discectomy 4/06    decompression, foaminotomy, and fusion Dr. Jeral Fruit     Outpatient Encounter Prescriptions as of 04/05/2011  Medication Sig Dispense Refill  . ALPRAZolam (XANAX) 0.5 MG tablet Take 1 tablet (0.5 mg total) by mouth 3 (three) times daily as needed.  90 tablet  0  . aspirin 81 MG tablet Take 81 mg by mouth daily.        . citalopram (CELEXA) 10 MG tablet Take as directed       . fenofibrate 160 MG tablet TAKE 1 TABLET BY MOUTH EVERY DAY  30 tablet  5  . losartan (COZAAR) 100 MG tablet Take 100 mg by mouth daily.        Marland Kitchen omeprazole (PRILOSEC OTC) 20 MG tablet Take 20 mg by mouth daily.        Marland Kitchen  oxyCODONE-acetaminophen (PERCOCET) 7.5-325 MG per tablet Take as directed by pain management       . tiZANidine (ZANAFLEX) 4 MG capsule Take as directed by pain management       . DISCONTD: oxyCODONE-acetaminophen (PERCOCET) 7.5-500 MG per tablet Take as directed by pain management      . DISCONTD: tadalafil (CIALIS) 20 MG tablet Take as directed         No Known Allergies   Current Medications, Allergies, Past Medical History, Past Surgical History, Family History, and Social History were reviewed in [employer] record.  Review of Systems        See HPI - all other systems neg except as noted...  The patient complains of chest pain and dyspnea on exertion.  The patient denies anorexia, fever, weight loss, weight gain, vision loss, decreased hearing, hoarseness, syncope, peripheral edema, prolonged cough, headaches, hemoptysis, abdominal pain, melena, hematochezia, severe indigestion/heartburn, hematuria, incontinence, muscle weakness, suspicious skin lesions, transient blindness, difficulty walking, depression, unusual weight change, abnormal bleeding, enlarged lymph nodes, and angioedema.     Objective:   Physical Exam      WD, WN, 50 y/o WM in NAD... GENERAL:  Alert & oriented; pleasant & cooperative... HEENT:  /AT, EOM-wnl, PERRLA, Fundi-benign, EACs-clear, TMs-wnl, NOSE-clear, THROAT-clear & wnl. NECK:  Supple w/ decrROM; scar of CSpine surg; no JVD; normal carotid impulses w/o bruits;  no thyromegaly or nodules palpated; no lymphadenopathy. CHEST:  Clear to P & A; without wheezes/ rales/ or rhonchi. HEART:  Regular Rhythm; without murmurs/ rubs/ or gallops. ABDOMEN:  Soft & nontender; normal bowel sounds; no organomegaly or masses detected. RECTAL:  Neg - prostate 2+ & nontender w/o nodules; stool hematest neg. EXT: without deformities, mild arthritic changes; no varicose veins/ venous insuffic/ or edema. NEURO:  CN's intact; motor testing normal;  sensory testing normal; gait normal & balance OK. DERM:  No lesions noted; no rash etc...   Assessment & Plan:   Hx AB>  Breathing is good, at baseline w/o recent exac...  HBP>  BP controlled on Losartan Rx, continue same...  AtypCWP>  On ASA, stable w/o recent exac, see above...  HYPERLIPIDEMIA>  He is back on his Feno160 + low chol/ low fat diet...  GI> GERD, Hx stricture, needs baseline colon> continue the PPI daily...  DJD, need for left shoulder surg> sched to 9/12 by DrMurphy...  Chronic Pain Symdrome>  Managed by [facility] as discussed...  ANXIETY>  He remains stable on ALPRAZOLAM prn.Marland KitchenMarland Kitchen

## 2011-04-05 NOTE — Patient Instructions (Signed)
Today we updated your med list in EPIC...    Continue your current meds the same...  Call for any questions...  Let's plan a follow up visit in about 6 months, sooner if needed for problems.Marland KitchenMarland Kitchen

## 2011-05-14 HISTORY — PX: SHOULDER SURGERY: SHX246

## 2011-05-17 ENCOUNTER — Encounter (HOSPITAL_BASED_OUTPATIENT_CLINIC_OR_DEPARTMENT_OTHER)
Admission: RE | Admit: 2011-05-17 | Discharge: 2011-05-17 | Disposition: A | Discharge status: Home / Self Care | Payer: Medicare Other | Source: Ambulatory Visit | Attending: Orthopedic Surgery | Admitting: Orthopedic Surgery

## 2011-05-17 LAB — BASIC METABOLIC PANEL
BUN: 22 mg/dL (ref 6–23)
Chloride: 105 mEq/L (ref 96–112)
GFR calc Af Amer: >60 mL/min (ref >60)
Potassium: 4.2 mEq/L (ref 3.5–5.1)

## 2011-05-19 ENCOUNTER — Ambulatory Visit (HOSPITAL_BASED_OUTPATIENT_CLINIC_OR_DEPARTMENT_OTHER)
Admission: RE | Admit: 2011-05-19 | Discharge: 2011-05-19 | Disposition: A | Discharge status: Home / Self Care | Payer: Medicare Other | Source: Ambulatory Visit | Attending: Orthopedic Surgery | Admitting: Orthopedic Surgery

## 2011-05-19 DIAGNOSIS — M25819 Other specified joint disorders, unspecified shoulder: Secondary | ICD-10-CM | POA: Insufficient documentation

## 2011-05-19 DIAGNOSIS — M7500 Adhesive capsulitis of unspecified shoulder: Secondary | ICD-10-CM | POA: Insufficient documentation

## 2011-05-19 DIAGNOSIS — K219 Gastro-esophageal reflux disease without esophagitis: Secondary | ICD-10-CM | POA: Insufficient documentation

## 2011-05-19 DIAGNOSIS — Z01810 Encounter for preprocedural cardiovascular examination: Secondary | ICD-10-CM | POA: Insufficient documentation

## 2011-05-19 DIAGNOSIS — M67919 Unspecified disorder of synovium and tendon, unspecified shoulder: Secondary | ICD-10-CM | POA: Insufficient documentation

## 2011-05-19 DIAGNOSIS — F17200 Nicotine dependence, unspecified, uncomplicated: Secondary | ICD-10-CM | POA: Insufficient documentation

## 2011-05-19 DIAGNOSIS — M719 Bursopathy, unspecified: Secondary | ICD-10-CM | POA: Insufficient documentation

## 2011-05-19 DIAGNOSIS — Z01812 Encounter for preprocedural laboratory examination: Secondary | ICD-10-CM | POA: Insufficient documentation

## 2011-05-19 DIAGNOSIS — I10 Essential (primary) hypertension: Secondary | ICD-10-CM | POA: Insufficient documentation

## 2011-05-24 NOTE — Op Note (Signed)
  NAME:  Edward Chen, Edward Chen NO.:  [id number]  MEDICAL RECORD NO.:  [id number]  LOCATION:                                 FACILITY:  PHYSICIAN:  Loreta Ave, M.D.      DATE OF BIRTH:  DATE OF PROCEDURE:  05/19/2011 DATE OF DISCHARGE:                              OPERATIVE REPORT   PREOPERATIVE DIAGNOSES:  Left shoulder subacromial impingement, distal clavicle osteolysis, rotator cuff irritation, bursitis.  POSTOPERATIVE DIAGNOSES:  Left shoulder subacromial impingement, distal clavicle osteolysis, rotator cuff irritation, bursitis with secondary mild-to-moderate adhesive capsulitis with intra and extra-articular adhesions.  PROCEDURES:  Left shoulder exam under anesthesia with manipulation. Arthroscopy with debridement of intra and extra-articular adhesions. Bursectomy, acromioplasty, CA ligament release.  Excision of distal clavicle.  SURGEON:  Loreta Ave, MD  ASSISTANT:  Genene Churn. Barry Dienes, [place], present throughout the entire case and necessary for timely completion of procedure.  ANESTHESIA:  General.  BLOOD LOSS:  Minimal.  SPECIMENS:  None.  CULTURES:  None.  COMPLICATIONS:  None.  DRESSING:  Soft compressive with sling.  PROCEDURE:  The patient was brought to the operating room, placed on the operating table in supine position.  After adequate anesthesia had been obtained, the shoulder was examined.  Lacking about 20-25% of motion, all planes especially rotation internally and overhead.  Gently manipulated, achieving full motion stable shoulder.  Placed in beach- chair position on the shoulder positioner and prepped and draped in usual sterile fashion.  Three portals were created; anterior, posterior and lateral.  Arthroscope was introduced.  The shoulder was distended and inspected.  Residual adhesion was debrided.  A little fraying of the labrum around the glenoid debrided.  The biceps tendon and anchor intact.  This was a little bit  more anterior than usual.  No tendinopathy.  Undersurface of the cuff looked good.  Capsule ligamentous structures were intact.  Cannula redirected subacromially. Reactive bursitis debrided.  Abrasive changes on the top of the cuff especially anterior supraspinatus debrided.  No full-thickness tears. Acromioplasty from type 3 to a type 1 acromion with shaver and high- speed bur releasing CA ligament with cautery.  Grade 3 and some focal grade 4 changes of the distal clavicle.  Periarticular spurs of lateral centimeter of clavicle resected.  Adequacy of decompression confirmed viewing from all portals.  Instruments and fluids were removed.  Portals of shoulder bursa injected with Marcaine.  Portals were closed with 4-0 nylon.  Sterile compressive dressing was applied. Anesthesia was reversed.  Brought to the recovery room.  Tolerated the surgery well.  No complications.     Loreta Ave, M.D.     DFM/MEDQ  D:  05/19/2011  T:  05/19/2011  Job:  [id number]  Electronically Signed by Mckinley Jewel M.D. on 05/24/2011 02:47:28 PM

## 2011-05-25 ENCOUNTER — Other Ambulatory Visit: Payer: Self-pay | Admitting: *Deleted

## 2011-05-25 MED ORDER — ALPRAZOLAM 0.5 MG PO TABS: 0.5000 mg | ORAL_TABLET | Freq: Three times a day (TID) | ORAL | Status: DC | PRN

## 2011-07-28 ENCOUNTER — Other Ambulatory Visit: Payer: Self-pay | Admitting: *Deleted

## 2011-07-28 MED ORDER — ALPRAZOLAM 0.5 MG PO TABS: 0.5000 mg | ORAL_TABLET | Freq: Three times a day (TID) | ORAL | Status: DC | PRN

## 2011-09-20 ENCOUNTER — Other Ambulatory Visit: Payer: Self-pay | Admitting: Pulmonary Disease

## 2011-09-20 MED ORDER — ALPRAZOLAM 0.5 MG PO TABS: 0.5000 mg | ORAL_TABLET | Freq: Three times a day (TID) | ORAL | Status: DC | PRN

## 2011-10-03 ENCOUNTER — Ambulatory Visit (INDEPENDENT_AMBULATORY_CARE_PROVIDER_SITE_OTHER): Payer: Medicare Other | Admitting: Pulmonary Disease

## 2011-10-03 ENCOUNTER — Encounter: Payer: Self-pay | Admitting: Pulmonary Disease

## 2011-10-03 ENCOUNTER — Other Ambulatory Visit (INDEPENDENT_AMBULATORY_CARE_PROVIDER_SITE_OTHER): Payer: Medicare Other

## 2011-10-03 DIAGNOSIS — I10 Essential (primary) hypertension: Secondary | ICD-10-CM

## 2011-10-03 DIAGNOSIS — E785 Hyperlipidemia, unspecified: Secondary | ICD-10-CM

## 2011-10-03 DIAGNOSIS — F411 Generalized anxiety disorder: Secondary | ICD-10-CM

## 2011-10-03 DIAGNOSIS — R7309 Other abnormal glucose: Secondary | ICD-10-CM

## 2011-10-03 DIAGNOSIS — Z9189 Other specified personal risk factors, not elsewhere classified: Secondary | ICD-10-CM

## 2011-10-03 DIAGNOSIS — G894 Chronic pain syndrome: Secondary | ICD-10-CM

## 2011-10-03 DIAGNOSIS — K219 Gastro-esophageal reflux disease without esophagitis: Secondary | ICD-10-CM

## 2011-10-03 LAB — BASIC METABOLIC PANEL
BUN: 18 mg/dL (ref 6–23)
CO2: 28 mEq/L (ref 19–32)
Calcium: 9.4 mg/dL (ref 8.4–10.5)
Glucose, Bld: 97 mg/dL (ref 70–99)
Sodium: 140 mEq/L (ref 135–145)

## 2011-10-03 LAB — LIPID PANEL: Triglycerides: 213 mg/dL — ABNORMAL HIGH (ref 0.0–149.0)

## 2011-10-03 MED ORDER — ALPRAZOLAM 0.5 MG PO TABS: 0.5000 mg | ORAL_TABLET | Freq: Three times a day (TID) | ORAL | Status: DC | PRN

## 2011-10-03 NOTE — Patient Instructions (Signed)
Today we updated your med list in our EPIC system...    Continue your current medications the same...  We refilled your Alprazolam today per request...  Today we did your follow up fasting blood work...    Please call the PHONE TREE in a few days for your results...    Dial [id number] & when prompted enter your patient number followed by the # symbol...    Your patient number is:  [id number]#  Call for any questions...  Let's plan a mid-year check up in about 6 months.Marland KitchenMarland Kitchen

## 2011-10-03 NOTE — Progress Notes (Signed)
Subjective:    Patient ID: Edward Chen, male    DOB: 12-05-1960, 51 y.o.   MRN: [id number]  HPI 51 y/o WM here for a follow up visit... he has multiple medical problems as noted below...  he is followed for his chronic neck, chest, arm, etc pain at [facility]...  ~  December 11, 2009:  he's had a pretty good year- save for recent AB exac treated w/ ZPak, Mucinex, & resolved... he is requesting change to generics where poss due to cost... still followed in the [facility] by for his chr pain syndrome & neck problems on Percocet, Celexa, Zanaflex...  ~  August 19, 2010:  8 month ROV & doing reasonably well- c/o decr energy & drive, wonders about "Low-T" and tried OTC herbal Rx "horney goat weed" w/o benefit... we discussed checking testos level (norm at 411) & Rx w/ Cialis for ED... Breathing is stable w/o exac;  BP controlled on the Losartan;  persist CP, chr pain syndrome treated in the Pain clinic;  due for f/u FLP to check TGs;  we refilled our meds today.  ~  April 05, 2011:  53mo ROV & he is doing reasonably well but continues to have orthopedic problems> this time left shoulder pain w/ eval by DrMurphy- he plans surg in September: He also notes optometrist recently treating him for Blepharitis...    His breathing has been fine> no asthma exac or bronchitic infections; BP well controlled on meds; Denies CP, palpit, dizzy, SOB, cough, sputum, etc; He had been off his Fenofib but back on now w/ low chol/ low fat diet (labs 6/12 reviewed); Finally he continues on his pain med regimen per [facility]...  ~  October 03, 2011:  43mo ROV & he indicates that he is stable, no new complaints or concerns;  He had his left shoulder surg 9/12 by DrMurphy (we do not have notes) & he says improved... Due for targeted labs today, he declines the Flu vaccine, see prob list below>>           Problem List:   PHYSICAL EXAMINATION (ICD-V70.0) - pt didn't bring med  bottles or list of his current medications today- he states that he knows his meds. ~  GI:  he has been followed by DrKaplan for EGD/ stricture dil 2007... needs routine colonoscopy. ~  GU:  no hx prostate problems;  PSA 6/12 = 0.58; c/o ED & Rx w/ Cialis trial. ~  Immunizations:  he has declined Flu vaccine & all vaccinations.  ASTHMATIC BRONCHITIS, ACUTE (ICD-466.0) - prev on Singulair but stopped due to $$; He was prev eval by DrESL in 2001 w/ mult meds- Advair, Zyrtek, Nasonex, Astelin, etc; He is asymptomatic w/o cough, sputum, SOB, etc... ~  CXR 2/10 showed normal heart size & clear lungs... ~  f/u CXR 4/11 showed clear lungs, heart at upper limits of norm, NAD.  HYPERTENSION, BENIGN (ICD-401.1) - on LOSARTAN 100mg /d & low sodium diet...  ~  7/12:  BP= 110/70 today but not checking BP at home... denies HA, fatigue, visual changes, CP, palipit, dizziness, syncope, dyspnea, edema, etc... ~  1/13:  BP= 120/88 & he admits not taking Losartan daily...  CHEST PAIN, ATYPICAL, HX OF (ICD-V15.89) & PALPITATIONS, HX OF (ICD-V12.50) - on ASA 81mg /d...  he had a cardiac eval in the 90's for these symptoms and mult risk factors including +FamHx by DrGrodecki w/ baseline EKG showing NSSTTWA;  2DEcho was neg  w/ norm LV size & function- EF= 60%, no MVP, mild MR;  StressTest was neg x for occas PVC's;  Event Recorder w/ lots of symptoms but only occas PVC's and PAC';  treated w/ rec for smoking cessation, no caffeine, Tranxene Prn... ~  NuclearStressTest 2005 was neg- no ischemia, no infarct... EF= 54%. ~  He saw several Orthopedists, DrZ for Rheum, & DrWeymann (neg EMG) in 2005 due to his atyp CWP. ~  EKG 4/11 showed NSR, WNL, NAD...  HYPERLIPIDEMIA (ICD-272.4) - on FENOFIBRATE 160mg /d + low chol/ low fat diet... ~  FLP 9/07 (wt= 178#) on diet alone showed TChol 197, TG 408, HDL 31, LDL 93... Fibrate Rx started w/ Tricor. ~  FLP 2/10 (wt= 186#) on Tricor145 showed TChol 201, TG 278, HDL 30, LDL 134...  needs better diet & may need statin. ~  FLP 4/11 (wt=185#) on Tricor145 showed TChol 164, TG 263, HDL 33, LDL 53... change to FENOFIBRATE 160mg /d for $$. ~  FLP 12/11 (wt=177#) on Feno160 showed TChol 227, TG 128, HDL 41, LDL 151... TG is great but Chol is up, may need statin! ~  FLP 6/12 off Fenofibrate x36mo showed TChol 169, TG 284, HDL 35, LDL 112... Get back on FENOFIBRATE 160mg /d. ~  FLP 1/13 on Feno160 showed TChol 170, TG 213, HDL 35, LDL 99... Continue same + better diet, gt wt down!  DIABETES MELLITUS, BORDERLINE (ICD-790.29) - on diet Rx alone> we discussed diet + exercise program... ~  prev labs showed FBS= 121... diet Rx discussed. ~  labs 2/10 showed BS= 107, HgA1c= 5.6 ~  labs 4/11 showed BS= 112, A1c= 5.3 ~  labs 12/11 showed BS= 107 ~  Labs 6/12 showed BS= 113, A1c= 5.6 ~  Labs 1/13 showed BS= 97, A1c= 5.5  GERD (ICD-530.81) - followed by DrKaplan & eval in 2007 w/ Ba Swallow showed esoph stricture w/ dysphagia... last EGD 12/07 showed esophagitis & stricture dilated... he rec treatment w/ PPI & pt switched to PRILOSEC OTC 20mg /d...  DEGENERATIVE JOINT DISEASE (ICD-715.90) - he had right shoulder surgery 11/05 by DrMurphy w/ arthroscopy, debridement, distal clavicle resection. ~  7/12: he reports left shoulder problem w/ eval by drMurphy & plans for Surg 9/12...  CHRONIC PAIN SYNDROME (ICD-338.4) - he had C3-4 spondylosis w/ HNP requiring ant cervical discectomy, decompression, foaminotomy, and fusion by [facility] 4/06... he tells me he is followed in a chronic pain clinic ([facility]) and treated w/ PERCOCET 7.5mg  Prn, CELEXA 10mg /d, & ZANAFLEX 4mg  Prn (we do not have notes from his pain doctors)...  ANXIETY (ICD-300.00) - on ALPRAZOLAM 0.5mg  Prn for nerves...   Past Surgical History  Procedure Date  . Right shoulder surgery 11/05    Arthroscopy, debridement, distal resection  . Cervical discectomy 4/06    decompression, foaminotomy, and fusion Dr. Jeral Fruit     . Left shoulder surgery 05/2011    Dr. Eulah Pont    Outpatient Encounter Prescriptions as of 10/03/2011  Medication Sig Dispense Refill  . ALPRAZolam (XANAX) 0.5 MG tablet Take 1 tablet (0.5 mg total) by mouth 3 (three) times daily as needed.  90 tablet  0  . aspirin 81 MG tablet Take 81 mg by mouth daily.        . fenofibrate 160 MG tablet TAKE 1 TABLET BY MOUTH EVERY DAY  30 tablet  5  . losartan (COZAAR) 100 MG tablet Take 100 mg by mouth daily.        Marland Kitchen omeprazole (PRILOSEC OTC) 20  MG tablet Take 20 mg by mouth daily.        Marland Kitchen oxyCODONE-acetaminophen (PERCOCET) 7.5-325 MG per tablet Take as directed by pain management       . tiZANidine (ZANAFLEX) 4 MG capsule Take as directed by pain management       . citalopram (CELEXA) 10 MG tablet Take as directed         No Known Allergies   Current Medications, Allergies, Past Medical History, Past Surgical History, Family History, and Social History were reviewed in [employer] record.   Review of Systems        See HPI - all other systems neg except as noted...  The patient complains of chest pain and dyspnea on exertion.  The patient denies anorexia, fever, weight loss, weight gain, vision loss, decreased hearing, hoarseness, syncope, peripheral edema, prolonged cough, headaches, hemoptysis, abdominal pain, melena, hematochezia, severe indigestion/heartburn, hematuria, incontinence, muscle weakness, suspicious skin lesions, transient blindness, difficulty walking, depression, unusual weight change, abnormal bleeding, enlarged lymph nodes, and angioedema.     Objective:   Physical Exam      WD, WN, 51 y/o WM in NAD... GENERAL:  Alert & oriented; pleasant & cooperative... HEENT:  [place]/AT, EOM-wnl, PERRLA, Fundi-benign, EACs-clear, TMs-wnl, NOSE-clear, THROAT-clear & wnl. NECK:  Supple w/ decrROM; scar of CSpine surg; no JVD; normal carotid impulses w/o bruits;  no thyromegaly or nodules palpated; no  lymphadenopathy. CHEST:  Clear to P & A; without wheezes/ rales/ or rhonchi. HEART:  Regular Rhythm; without murmurs/ rubs/ or gallops. ABDOMEN:  Soft & nontender; normal bowel sounds; no organomegaly or masses detected. RECTAL:  Neg - prostate 2+ & nontender w/o nodules; stool hematest neg. EXT: without deformities, mild arthritic changes; no varicose veins/ venous insuffic/ or edema. NEURO:  CN's intact; motor testing normal; sensory testing normal; gait normal & balance OK. DERM:  No lesions noted; no rash etc...  RADIOLOGY DATA:  Reviewed in the EPIC EMR & discussed w/ the patient...    >>CXR 4/11 showed heart size at the upper lim of norm, lungs clear, NAD...    >>EKG 4/11 showed NSR, rate 70/min, WNL/ NAD...  LABORATORY DATA:  Reviewed in the EPIC EMR & discussed w/ the patient...   Assessment & Plan:   Hx AB>  Breathing is good, at baseline w/o recent exac...  HBP>  BP controlled on Losartan Rx, continue same & encouraged to take med every day!...  AtypCWP>  On ASA, stable w/o recent exac, see above...  HYPERLIPIDEMIA>  He is back on his Feno160 + low chol/ low fat diet...  GI> GERD, Hx stricture, needs baseline colon> continue the PPI daily...  DJD, need for left shoulder surg> sched to 9/12 by DrMurphy...  Chronic Pain Symdrome>  Managed by [facility] as discussed...  ANXIETY>  He remains stable on ALPRAZOLAM prn.Marland KitchenMarland Kitchen

## 2011-10-06 ENCOUNTER — Encounter: Payer: Self-pay | Admitting: Pulmonary Disease

## 2011-10-17 ENCOUNTER — Telehealth: Payer: Self-pay | Admitting: Pulmonary Disease

## 2011-10-17 MED ORDER — AZITHROMYCIN 250 MG PO TABS: ORAL_TABLET | ORAL | Status: AC

## 2011-10-17 NOTE — Telephone Encounter (Signed)
Sounds viral -like a cold. -ABX DO NOT WORK FOR VIRUSES  rec mucinex DM Twice daily As needed   Fluids , rest and tylenol As needed   Saline nasal rinses As needed   Claritin As needed  Drainage.  Zpack to have on hold if symptoms do not improve or worsen.  Zpack #1 take as directed . No refills  Please contact office for sooner follow up if symptoms do not improve or worsen or seek emergency care  Ov if not improving

## 2011-10-17 NOTE — Telephone Encounter (Signed)
Pt c/o head congestion, scratchy throat, dry cough, chest congestion, runny nose all x 2 days. Pt requests a zpak. Please advise. Carron Curie, CMA No Known Allergies

## 2011-10-17 NOTE — Telephone Encounter (Signed)
I spoke with pt and is aware of TP recs. He vocied his understanding and would like the rx for zpak sent to [facility] [address]. Nothing further was needed

## 2011-10-17 NOTE — Telephone Encounter (Signed)
[email or web address].Jennifer Castillo, CMA  

## 2011-11-02 ENCOUNTER — Telehealth: Payer: Self-pay | Admitting: Pulmonary Disease

## 2011-11-02 DIAGNOSIS — R21 Rash and other nonspecific skin eruption: Secondary | ICD-10-CM

## 2011-11-02 NOTE — Telephone Encounter (Signed)
Called and spoke with pt. Pt requesting to be referred to Dermatologist.  States he saw a male dermatologist on [address] (can't remember her name) approx 1 year ago and doesn't want to see her again.  Requesting someone new.  Pt requesting referral d/t rash on knee x 1 year. States rash is red and itching and benadryl and "cream" do not help it.  SN, please advise.  Thanks!!

## 2011-11-03 NOTE — Telephone Encounter (Signed)
Per SN-okay for referral to Dr Danella Deis for eval [place]. Pt is aware of referral placed to our [facility] to work on.

## 2011-11-19 ENCOUNTER — Other Ambulatory Visit: Payer: Self-pay | Admitting: Pulmonary Disease

## 2011-12-05 ENCOUNTER — Other Ambulatory Visit: Payer: Self-pay | Admitting: Pulmonary Disease

## 2012-02-07 ENCOUNTER — Telehealth: Payer: Self-pay | Admitting: Pulmonary Disease

## 2012-02-07 NOTE — Telephone Encounter (Signed)
Called spoke with patient who c/o right-sided back pain in his mid-back x2 weeks.  Denies any urinary symptoms.  Has been doing heavy lifting > making a building in his back yard and reports lifting heavy objects.  Does not hurt when lying down.  "Numbing sensation" in the area when he stands in a certain position.  Dr Kriste Basque please advise, thanks.  Last seen 1.21.13, next ov 7.22.13. NKDA - verified. [facility] [address]

## 2012-02-07 NOTE — Telephone Encounter (Signed)
Spoke with pt and notified of recs per SN. Pt verbalized understanding and states nothing further needed.  

## 2012-02-07 NOTE — Telephone Encounter (Signed)
Per SN--he should call his ortho---Dr. Eulah Pont for appt for eval.  thanks

## 2012-04-02 ENCOUNTER — Ambulatory Visit: Payer: Medicare Other | Admitting: Pulmonary Disease

## 2012-04-10 ENCOUNTER — Ambulatory Visit (INDEPENDENT_AMBULATORY_CARE_PROVIDER_SITE_OTHER): Payer: Medicare Other | Admitting: Pulmonary Disease

## 2012-04-10 ENCOUNTER — Encounter: Payer: Self-pay | Admitting: Pulmonary Disease

## 2012-04-10 VITALS — BP 126/92 | HR 81 | Temp 97.0°F | Ht 67.0 in | Wt 164.0 lb

## 2012-04-10 DIAGNOSIS — L0233 Carbuncle of buttock: Secondary | ICD-10-CM

## 2012-04-10 DIAGNOSIS — M1990 Unspecified osteoarthritis, unspecified site: Secondary | ICD-10-CM

## 2012-04-10 DIAGNOSIS — J209 Acute bronchitis, unspecified: Secondary | ICD-10-CM

## 2012-04-10 DIAGNOSIS — G894 Chronic pain syndrome: Secondary | ICD-10-CM

## 2012-04-10 DIAGNOSIS — E785 Hyperlipidemia, unspecified: Secondary | ICD-10-CM

## 2012-04-10 DIAGNOSIS — L0232 Furuncle of buttock: Secondary | ICD-10-CM

## 2012-04-10 DIAGNOSIS — K219 Gastro-esophageal reflux disease without esophagitis: Secondary | ICD-10-CM

## 2012-04-10 DIAGNOSIS — F411 Generalized anxiety disorder: Secondary | ICD-10-CM

## 2012-04-10 DIAGNOSIS — I10 Essential (primary) hypertension: Secondary | ICD-10-CM

## 2012-04-10 MED ORDER — CEPHALEXIN 500 MG PO CAPS: 500.0000 mg | ORAL_CAPSULE | Freq: Three times a day (TID) | ORAL | Status: AC

## 2012-04-10 NOTE — Patient Instructions (Addendum)
Today we updated your med list in our EPIC system...    Continue your current medications the same...  Try the SITZ baths> soak in warm soap water daily...  Take the [facility] 500mg - one tab 3 times daily til gone...  Call for any questions...  Let's plan a follow up visit in 6 months w/ FASTING blood work around that time.Marland KitchenMarland Kitchen

## 2012-04-10 NOTE — Progress Notes (Signed)
Subjective:    Patient ID: Edward Chen, male    DOB: Apr 03, 1961, 51 y.o.   MRN: [id number]  HPI 51 y/o WM here for a follow up visit... he has multiple medical problems as noted below...  he is followed for his chronic neck, chest, arm, etc pain at [facility]...  ~  December 11, 2009:  he's had a pretty good year- save for recent AB exac treated w/ ZPak, Mucinex, & resolved... he is requesting change to generics where poss due to cost... still followed in the [facility] by for his chr pain syndrome & neck problems on Percocet, Celexa, Zanaflex...  ~  August 19, 2010:  8 month ROV & doing reasonably well- c/o decr energy & drive, wonders about "Low-T" and tried OTC herbal Rx "horney goat weed" w/o benefit... we discussed checking testos level (norm at 411) & Rx w/ Cialis for ED... Breathing is stable w/o exac;  BP controlled on the Losartan;  persist CP, chr pain syndrome treated in the Pain clinic;  due for f/u FLP to check TGs;  we refilled our meds today.  ~  April 05, 2011:  91mo ROV & he is doing reasonably well but continues to have orthopedic problems> this time left shoulder pain w/ eval by DrMurphy- he plans surg in September: He also notes optometrist recently treating him for Blepharitis...    His breathing has been fine> no asthma exac or bronchitic infections; BP well controlled on meds; Denies CP, palpit, dizzy, SOB, cough, sputum, etc; He had been off his Fenofib but back on now w/ low chol/ low fat diet (labs 6/12 reviewed); Finally he continues on his pain med regimen per [facility]...  ~  October 03, 2011:  58mo ROV & he indicates that he is stable, no new complaints or concerns;  He had his left shoulder surg 9/12 by DrMurphy (we do not have notes) & he says improved... Due for targeted labs today, he declines the Flu vaccine, see prob list below >>  LABS 1/13:  FLP- ok x TG=213 on Feno160;  Chems- wnl w/ BS=97 A1c=5.5  ~  April 10, 2012:  58mo ROV & add-on for boil on buttocks> thinks he stained something lifting a heavy fellow off the floor at church; but exam shows a boil in perirectal position on right butt cheek; we discussed need for SITZ baths in warm soapy water, KEFLEX 500mg  Tid x7d, Anusol cream as needed + stool softeners...    We reviewed prob list, meds, xrays and labs> see below for updates >> he tells me he hasn't been taking his Losartan regularly & asked to resume daily dosing; he sees Pain Management monthly (?DrKirshmayer) for his Percocet, Celexa, muscle relaxer...           Problem List:   PHYSICAL EXAMINATION (ICD-V70.0) - pt didn't bring med bottles or list of his current medications today- he states that he knows his meds. ~  GI:  he has been followed by DrKaplan for EGD/ stricture dil 2007... needs routine colonoscopy. ~  GU:  no hx prostate problems;  PSA 6/12 = 0.58; c/o ED & Rx w/ Cialis trial. ~  Immunizations:  he has declined Flu vaccine & all vaccinations.  ASTHMATIC BRONCHITIS, ACUTE (ICD-466.0) - prev on Singulair but stopped due to $$; He was prev eval by DrESL in 2001 w/ mult meds- Advair, Zyrtek, Nasonex, Astelin, etc; He is asymptomatic w/o cough, sputum, SOB, etc... ~  CXR 2/10 showed normal heart size & clear lungs... ~  f/u CXR 4/11 showed clear lungs, heart at upper limits of norm, NAD.  HYPERTENSION, BENIGN (ICD-401.1) - on LOSARTAN 100mg /d & low sodium diet...  ~  7/12:  BP= 110/70 today but not checking BP at home... denies HA, fatigue, visual changes, CP, palipit, dizziness, syncope, dyspnea, edema, etc... ~  1/13:  BP= 120/88 & he admits not taking Losartan daily... ~  7/13:  BP= 130/92 & still not taking Losartan regularly; asked to take this med every day!  CHEST PAIN, ATYPICAL, HX OF (ICD-V15.89) & PALPITATIONS, HX OF (ICD-V12.50) - on ASA 81mg /d...  he had a cardiac eval in the 90's for these symptoms and mult risk factors including +FamHx by DrGrodecki w/ baseline EKG  showing NSSTTWA;  2DEcho was neg w/ norm LV size & function- EF= 60%, no MVP, mild MR;  StressTest was neg x for occas PVC's;  Event Recorder w/ lots of symptoms but only occas PVC's and PAC';  treated w/ rec for smoking cessation, no caffeine, Tranxene Prn... ~  NuclearStressTest 2005 was neg- no ischemia, no infarct... EF= 54%. ~  He saw several Orthopedists, DrZ for Rheum, & DrWeymann (neg EMG) in 2005 due to his atyp CWP. ~  EKG 4/11 showed NSR, WNL, NAD...  HYPERLIPIDEMIA (ICD-272.4) - on FENOFIBRATE 160mg /d + low chol/ low fat diet... ~  FLP 9/07 (wt= 178#) on diet alone showed TChol 197, TG 408, HDL 31, LDL 93... Fibrate Rx started w/ Tricor. ~  FLP 2/10 (wt= 186#) on Tricor145 showed TChol 201, TG 278, HDL 30, LDL 134... needs better diet & may need statin. ~  FLP 4/11 (wt=185#) on Tricor145 showed TChol 164, TG 263, HDL 33, LDL 53... change to FENOFIBRATE 160mg /d for $$. ~  FLP 12/11 (wt=177#) on Feno160 showed TChol 227, TG 128, HDL 41, LDL 151... TG is great but Chol is up, may need statin! ~  FLP 6/12 off Fenofibrate x28mo showed TChol 169, TG 284, HDL 35, LDL 112... Get back on FENOFIBRATE 160mg /d. ~  FLP 1/13 on Feno160 showed TChol 170, TG 213, HDL 35, LDL 99... Continue same + better diet, gt wt down!  DIABETES MELLITUS, BORDERLINE (ICD-790.29) - on diet Rx alone> we discussed diet + exercise program... ~  prev labs showed FBS= 121... diet Rx discussed. ~  labs 2/10 showed BS= 107, HgA1c= 5.6 ~  labs 4/11 showed BS= 112, A1c= 5.3 ~  labs 12/11 showed BS= 107 ~  Labs 6/12 showed BS= 113, A1c= 5.6 ~  Labs 1/13 showed BS= 97, A1c= 5.5  GERD (ICD-530.81) - followed by DrKaplan & eval in 2007 w/ Ba Swallow showed esoph stricture w/ dysphagia... last EGD 12/07 showed esophagitis & stricture dilated... he rec treatment w/ PPI & pt switched to PRILOSEC OTC 20mg /d- he tells me he averages Qod dosing.  DEGENERATIVE JOINT DISEASE (ICD-715.90) - he had right shoulder surgery 11/05 by  DrMurphy w/ arthroscopy, debridement, distal clavicle resection. ~  7/12: he reports left shoulder problem w/ eval by DrMurphy & plans for Surg 9/12... ~  7/13: he tells me he had bilat shoulder surg's follow by second operations for EUA w/ ROM to lyse adhesions...  CHRONIC PAIN SYNDROME (ICD-338.4) - he had C3-4 spondylosis w/ HNP requiring ant cervical discectomy, decompression, foaminotomy, and fusion by [facility] 4/06... he tells me he is followed in a chronic pain clinic ([facility]) and treated w/ PERCOCET 7.5mg  Prn, CELEXA 10mg /d, & ZANAFLEX 4mg  Prn (  we do not have notes from his pain doctors)...  ANXIETY (ICD-300.00) - on ALPRAZOLAM 0.5mg  Prn for nerves...   Past Surgical History  Procedure Date  . Right shoulder surgery 11/05    Arthroscopy, debridement, distal resection  . Cervical discectomy 4/06    decompression, foaminotomy, and fusion Dr. Jeral Fruit   . Left shoulder surgery 05/2011    Dr. Eulah Pont    Outpatient Encounter Prescriptions as of 04/10/2012  Medication Sig Dispense Refill  . ALPRAZolam (XANAX) 0.5 MG tablet Take 1 tablet (0.5 mg total) by mouth 3 (three) times daily as needed.  90 tablet  5  . aspirin 81 MG tablet Take one tablet by mouth three times a week      . citalopram (CELEXA) 10 MG tablet Take as directed      . fenofibrate 160 MG tablet TAKE 1 TABLET BY MOUTH EVERY DAY  30 tablet  5  . losartan (COZAAR) 100 MG tablet TAKE 1 TABLET BY MOUTH EVERY DAY  30 tablet  6  . omeprazole (PRILOSEC OTC) 20 MG tablet Take 20 mg by mouth daily.        Marland Kitchen oxyCODONE-acetaminophen (PERCOCET) 7.5-325 MG per tablet Take as directed by pain management       . DISCONTD: tiZANidine (ZANAFLEX) 4 MG capsule Take as directed by pain management         No Known Allergies   Current Medications, Allergies, Past Medical History, Past Surgical History, Family History, and Social History were reviewed in [employer] record.   Review of Systems         See HPI - all other systems neg except as noted...  The patient complains of chest pain and dyspnea on exertion.  The patient denies anorexia, fever, weight loss, weight gain, vision loss, decreased hearing, hoarseness, syncope, peripheral edema, prolonged cough, headaches, hemoptysis, abdominal pain, melena, hematochezia, severe indigestion/heartburn, hematuria, incontinence, muscle weakness, suspicious skin lesions, transient blindness, difficulty walking, depression, unusual weight change, abnormal bleeding, enlarged lymph nodes, and angioedema.     Objective:   Physical Exam      WD, WN, 51 y/o WM in NAD... GENERAL:  Alert & oriented; pleasant & cooperative... HEENT:  [place]/AT, EOM-wnl, PERRLA, Fundi-benign, EACs-clear, TMs-wnl, NOSE-clear, THROAT-clear & wnl. NECK:  Supple w/ decrROM; scar of CSpine surg; no JVD; normal carotid impulses w/o bruits;  no thyromegaly or nodules palpated; no lymphadenopathy. CHEST:  Clear to P & A; without wheezes/ rales/ or rhonchi. HEART:  Regular Rhythm; without murmurs/ rubs/ or gallops. ABDOMEN:  Soft & nontender; normal bowel sounds; no organomegaly or masses detected. RECTAL:  Boil on right buttock;  prostate 2+ & nontender w/o nodules; stool hematest neg. EXT: without deformities, mild arthritic changes; no varicose veins/ venous insuffic/ or edema. NEURO:  CN's intact; motor testing normal; sensory testing normal; gait normal & balance OK. DERM:  No lesions noted; no rash etc...  RADIOLOGY DATA:  Reviewed in the EPIC EMR & discussed w/ the patient...    >>CXR 4/11 showed heart size at the upper lim of norm, lungs clear, NAD...    >>EKG 4/11 showed NSR, rate 70/min, WNL/ NAD...  LABORATORY DATA:  Reviewed in the EPIC EMR & discussed w/ the patient...   Assessment & Plan:   BOIL on buttocks 7/13 >  rec SITZ baths & Keflex 500mg  tid... We will send to [facility] if not resolved...    Hx AB>  Breathing is good, at baseline w/o recent exac...  HBP>  BP controlled on Losartan Rx, when he takes it! Advised to take med DAILY & monitor BP at home...  AtypCWP>  On ASA, stable w/o recent exac, see above...  HYPERLIPIDEMIA>  He is back on his Feno160 + low chol/ low fat diet...  GI> GERD, Hx stricture, needs baseline colon> continue the PPI daily...  DJD, s/p left shoulder surg>  He's had bilat shoulder operations w/ second procedures for lysis of adhesions/ EUA...  Chronic Pain Symdrome>  Managed by [facility] as discussed...  ANXIETY>  He remains stable on ALPRAZOLAM prn...   Patient's Medications  New Prescriptions   CEPHALEXIN (KEFLEX) 500 MG CAPSULE    Take 1 capsule (500 mg total) by mouth 3 (three) times daily.  Previous Medications   ALPRAZOLAM (XANAX) 0.5 MG TABLET    Take 1 tablet (0.5 mg total) by mouth 3 (three) times daily as needed.   ASPIRIN 81 MG TABLET    Take one tablet by mouth three times a week   CITALOPRAM (CELEXA) 10 MG TABLET    Take as directed   FENOFIBRATE 160 MG TABLET    TAKE 1 TABLET BY MOUTH EVERY DAY   LOSARTAN (COZAAR) 100 MG TABLET    TAKE 1 TABLET BY MOUTH EVERY DAY   OMEPRAZOLE (PRILOSEC OTC) 20 MG TABLET    Take 20 mg by mouth daily.     OXYCODONE-ACETAMINOPHEN (PERCOCET) 7.5-325 MG PER TABLET    Take as directed by pain management   Modified Medications   No medications on file  Discontinued Medications   TIZANIDINE (ZANAFLEX) 4 MG CAPSULE    Take as directed by pain management

## 2012-04-25 ENCOUNTER — Other Ambulatory Visit: Payer: Self-pay | Admitting: Pulmonary Disease

## 2012-04-25 MED ORDER — ALPRAZOLAM 0.5 MG PO TABS: 0.5000 mg | ORAL_TABLET | Freq: Three times a day (TID) | ORAL | Status: DC | PRN

## 2012-08-14 ENCOUNTER — Other Ambulatory Visit: Payer: Self-pay | Admitting: Physician Assistant

## 2012-08-14 ENCOUNTER — Ambulatory Visit
Admission: RE | Admit: 2012-08-14 | Discharge: 2012-08-14 | Disposition: A | Discharge status: Home / Self Care | Payer: Medicare Other | Source: Ambulatory Visit | Attending: Physician Assistant | Admitting: Physician Assistant

## 2012-08-14 DIAGNOSIS — R52 Pain, unspecified: Secondary | ICD-10-CM

## 2012-09-01 ENCOUNTER — Telehealth: Payer: Self-pay | Admitting: Internal Medicine

## 2012-09-01 MED ORDER — AZITHROMYCIN 250 MG PO TABS: ORAL_TABLET | ORAL | Status: DC

## 2012-09-01 NOTE — Telephone Encounter (Signed)
Requesting zpak for uri x 24 h dry cough only  Very confused with meds, has advair "when he needs" it but can't find his short acting inhaler.    Informed not approp to use advair as needed and not clear why none of his inhalers are listed in the emr - needs f/u in office but go to er in meantime if condition worsens.

## 2012-10-11 ENCOUNTER — Ambulatory Visit: Payer: Medicare Other | Admitting: Pulmonary Disease

## 2012-10-22 ENCOUNTER — Encounter: Payer: Self-pay | Admitting: Pulmonary Disease

## 2012-10-22 ENCOUNTER — Encounter: Payer: Self-pay | Admitting: Gastroenterology

## 2012-10-22 ENCOUNTER — Ambulatory Visit (INDEPENDENT_AMBULATORY_CARE_PROVIDER_SITE_OTHER): Payer: Medicare Other | Admitting: Pulmonary Disease

## 2012-10-22 ENCOUNTER — Other Ambulatory Visit (INDEPENDENT_AMBULATORY_CARE_PROVIDER_SITE_OTHER): Payer: Medicare Other

## 2012-10-22 ENCOUNTER — Ambulatory Visit (INDEPENDENT_AMBULATORY_CARE_PROVIDER_SITE_OTHER)
Admission: RE | Admit: 2012-10-22 | Discharge: 2012-10-22 | Disposition: A | Discharge status: Home / Self Care | Payer: Medicare Other | Source: Ambulatory Visit | Attending: Pulmonary Disease | Admitting: Pulmonary Disease

## 2012-10-22 VITALS — BP 122/88 | HR 73 | Temp 97.5°F | Ht 67.0 in | Wt 172.2 lb

## 2012-10-22 DIAGNOSIS — E785 Hyperlipidemia, unspecified: Secondary | ICD-10-CM

## 2012-10-22 DIAGNOSIS — N139 Obstructive and reflux uropathy, unspecified: Secondary | ICD-10-CM

## 2012-10-22 DIAGNOSIS — R7309 Other abnormal glucose: Secondary | ICD-10-CM

## 2012-10-22 DIAGNOSIS — K219 Gastro-esophageal reflux disease without esophagitis: Secondary | ICD-10-CM

## 2012-10-22 DIAGNOSIS — G894 Chronic pain syndrome: Secondary | ICD-10-CM

## 2012-10-22 DIAGNOSIS — F411 Generalized anxiety disorder: Secondary | ICD-10-CM

## 2012-10-22 DIAGNOSIS — M1990 Unspecified osteoarthritis, unspecified site: Secondary | ICD-10-CM

## 2012-10-22 DIAGNOSIS — N401 Enlarged prostate with lower urinary tract symptoms: Secondary | ICD-10-CM

## 2012-10-22 DIAGNOSIS — J209 Acute bronchitis, unspecified: Secondary | ICD-10-CM

## 2012-10-22 DIAGNOSIS — I10 Essential (primary) hypertension: Secondary | ICD-10-CM

## 2012-10-22 DIAGNOSIS — Z9189 Other specified personal risk factors, not elsewhere classified: Secondary | ICD-10-CM

## 2012-10-22 DIAGNOSIS — N138 Other obstructive and reflux uropathy: Secondary | ICD-10-CM

## 2012-10-22 LAB — CBC WITH DIFFERENTIAL/PLATELET
Basophils Absolute: 0 10*3/uL (ref 0.0–0.1)
Basophils Relative: 0.7 % (ref 0.0–3.0)
Eosinophils Absolute: 0.2 10*3/uL (ref 0.0–0.7)
Hemoglobin: 14.6 g/dL (ref 13.0–17.0)
Lymphocytes Relative: 33.9 % (ref 12.0–46.0)
MCHC: 34.7 g/dL (ref 30.0–36.0)
MCV: 91.5 fl (ref 78.0–100.0)
Monocytes Absolute: 0.4 10*3/uL (ref 0.1–1.0)
Neutro Abs: 3.6 10*3/uL (ref 1.4–7.7)
RDW: 12.8 % (ref 11.5–14.6)

## 2012-10-22 LAB — HEPATIC FUNCTION PANEL
ALT: 29 U/L (ref 0–53)
Alkaline Phosphatase: 51 U/L (ref 39–117)
Bilirubin, Direct: 0.1 mg/dL (ref 0.0–0.3)
Total Bilirubin: 0.8 mg/dL (ref 0.3–1.2)
Total Protein: 7.4 g/dL (ref 6.0–8.3)

## 2012-10-22 LAB — BASIC METABOLIC PANEL
CO2: 28 mEq/L (ref 19–32)
Calcium: 9.3 mg/dL (ref 8.4–10.5)
Chloride: 102 mEq/L (ref 96–112)
Sodium: 139 mEq/L (ref 135–145)

## 2012-10-22 LAB — PSA: PSA: 0.53 ng/mL (ref 0.10–4.00)

## 2012-10-22 LAB — LIPID PANEL
Total CHOL/HDL Ratio: 6
VLDL: 60.4 mg/dL — ABNORMAL HIGH (ref 0.0–40.0)

## 2012-10-22 MED ORDER — AZITHROMYCIN 250 MG PO TABS: 250.0000 mg | ORAL_TABLET | Freq: Every day | ORAL | Status: DC

## 2012-10-22 NOTE — Progress Notes (Addendum)
Subjective:    Patient ID: Edward Chen, male    DOB: 19-Aug-1961, 52 y.o.   MRN: [id number]  HPI 52 y/o WM here for a follow up visit... he has multiple medical problems as noted below...  he is followed for his chronic neck, chest, arm, etc pain at [facility]...  ~  April 05, 2011:  5mo ROV & he is doing reasonably well but continues to have orthopedic problems> this time left shoulder pain w/ eval by DrMurphy- he plans surg in September: He also notes optometrist recently treating him for Blepharitis...    His breathing has been fine> no asthma exac or bronchitic infections; BP well controlled on meds; Denies CP, palpit, dizzy, SOB, cough, sputum, etc; He had been off his Fenofib but back on now w/ low chol/ low fat diet (labs 6/12 reviewed); Finally he continues on his pain med regimen per [facility]...  ~  October 03, 2011:  35mo ROV & he indicates that he is stable, no new complaints or concerns;  He had his left shoulder surg 9/12 by DrMurphy (we do not have notes) & he says improved... Due for targeted labs today, he declines the Flu vaccine, see prob list below >>  LABS 1/13:  FLP- ok x TG=213 on Feno160;  Chems- wnl w/ BS=97 A1c=5.5  ~  April 10, 2012:  35mo ROV & add-on for boil on buttocks> thinks he strained something lifting a heavy fellow off the floor at church; but exam shows a boil in perirectal position on right butt cheek; we discussed need for SITZ baths in warm soapy water, KEFLEX 500mg  Tid x7d, Anusol cream as needed + stool softeners...    We reviewed prob list, meds, xrays and labs> see below for updates >> he tells me he hasn't been taking his Losartan regularly & asked to resume daily dosing; he sees Pain Management monthly (?DrKirshmayer) for his Percocet, Celexa, muscle relaxer...   ~  October 22, 2012:  35mo ROV & Edward Chen tells me he's been suffering w/ back pain x 4-5mo- actually right post CWP & back pain, notes it was worse crawling under a house  to fix duct, better w/ rest/ heat; he is under the care of [facility] clinic on Percocet7.5 prn...    AB> he wants ZPak to keep on hand; breathing is good w/o cough, sput, hemoptysis, wheezing, SOB, edema, etc...    HBP, AtypCP, Palpit> on ASA81, Losar100; BP= 122/88 & he denies angina, palpit, ch in SOB, edema, etc...    Hyperlipid> on Feno160; FLP shows TChol 185, TG 302, HDL 32, LDL 113; not at goals & needs better low fat diet, get wt down...    BorderlineDM> on diet alone; BS=104, A1c= 5.1 and advised on wt reduction...    GERD> on Prilosec20; he is stable on the PPI & denies abd pain, n/v, c/d, blood seen; in need of screening colonoscopy & we will refer to DrKaplan...    DJD> degen spurring in Tspine on XRays; s/p bilat shoulder surg by DrMurphy...    Chronic Pain Syndrome> on Percocet7.5; prev Cspine surg w/ ant fusion plate; he remains under the care of [facility]...    Anxiety> on Xanax0.5, Celexa10; he says these help w/ his panic attacks... We reviewed prob list, meds, xrays and labs> see below for updates >> OK TDAP today; needs colon & he will sched; ok ZPak for prn use... CXR 2/14 showed heart at upper lim of normal,  lungs clear, NAD... LABS 2/14:  FLP- not at goals on Feno160, needs better low fat diet;  Chems- wnl;  CBC- wnl;  TSH=1.62;  PSA=0.53...          Problem List:   PHYSICAL EXAMINATION (ICD-V70.0) - pt didn't bring med bottles or list of his current medications today- he states that he knows his meds. ~  GI:  he has been followed by DrKaplan for EGD/ stricture dil 2007... needs routine colonoscopy. ~  GU:  no hx prostate problems;  PSA 2/14 = 0.53; c/o ED & Rx w/ Cialis trial. ~  Immunizations:  he has declined Flu vaccine but we gave him TDAP 2/14...  ASTHMATIC BRONCHITIS, ACUTE (ICD-466.0) - prev on Singulair but stopped due to $$; He was prev eval by DrESL in 2001 w/ mult meds- Advair, Zyrtek, Nasonex, Astelin, etc; He is asymptomatic w/o  cough, sputum, SOB, etc... ~  CXR 2/10 showed normal heart size & clear lungs... ~  f/u CXR 4/11 showed clear lungs, heart at upper limits of norm, NAD. ~  CXR 2/14 showed heart at upper lim of normal, lungs clear, NAD...  HYPERTENSION, BENIGN (ICD-401.1) - on LOSARTAN 100mg /d & low sodium diet...  ~  7/12:  BP= 110/70 today but not checking BP at home... denies HA, fatigue, visual changes, CP, palipit, dizziness, syncope, dyspnea, edema, etc... ~  1/13:  BP= 120/88 & he admits not taking Losartan daily... ~  7/13:  BP= 130/92 & still not taking Losartan regularly; asked to take this med every day! ~  2/14: on ASA81, Losar100; BP= 122/88 & he denies angina, palpit, ch in SOB, edema, etc...  CHEST PAIN, ATYPICAL, HX OF (ICD-V15.89) & PALPITATIONS, HX OF (ICD-V12.50) - on ASA 81mg /d...  he had a cardiac eval in the 90's for these symptoms and mult risk factors including +FamHx by DrGrodecki w/ baseline EKG showing NSSTTWA;  2DEcho was neg w/ norm LV size & function- EF= 60%, no MVP, mild MR;  StressTest was neg x for occas PVC's;  Event Recorder w/ lots of symptoms but only occas PVC's and PAC';  treated w/ rec for smoking cessation, no caffeine, Tranxene Prn... ~  NuclearStressTest 2005 was neg- no ischemia, no infarct... EF= 54%. ~  He saw several Orthopedists, DrZ for Rheum, & DrWeymann (neg EMG) in 2005 due to his atyp CWP. ~  EKG 4/11 showed NSR, WNL, NAD...  HYPERLIPIDEMIA (ICD-272.4) - on FENOFIBRATE 160mg /d + low chol/ low fat diet... ~  FLP 9/07 (wt= 178#) on diet alone showed TChol 197, TG 408, HDL 31, LDL 93... Fibrate Rx started w/ Tricor. ~  FLP 2/10 (wt= 186#) on Tricor145 showed TChol 201, TG 278, HDL 30, LDL 134... needs better diet & may need statin. ~  FLP 4/11 (wt=185#) on Tricor145 showed TChol 164, TG 263, HDL 33, LDL 53... change to FENOFIBRATE 160mg /d for $$. ~  FLP 12/11 (wt=177#) on Feno160 showed TChol 227, TG 128, HDL 41, LDL 151... TG is great but Chol is up, may need  statin! ~  FLP 6/12 off Fenofibrate x86mo showed TChol 169, TG 284, HDL 35, LDL 112... Get back on FENOFIBRATE 160mg /d. ~  FLP 1/13 on Feno160 showed TChol 170, TG 213, HDL 35, LDL 99... Continue same + better diet, gt wt down! ~  FLP 2/14 on Feno160 showed TChol 185, TG 302, HDL 32, LDL 113; not at goals & needs better low fat diet, get wt down.  DIABETES MELLITUS, BORDERLINE (ICD-790.29) - on diet  Rx alone> we discussed diet + exercise program... ~  prev labs showed FBS= 121... diet Rx discussed. ~  labs 2/10 showed BS= 107, HgA1c= 5.6 ~  labs 4/11 showed BS= 112, A1c= 5.3 ~  labs 12/11 showed BS= 107 ~  Labs 6/12 showed BS= 113, A1c= 5.6 ~  Labs 1/13 showed BS= 97, A1c= 5.5 ~  Labs 2/14 on diet alone showed BS= 104, A1c= 5.1  GERD (ICD-530.81) - followed by DrKaplan & eval in 2007 w/ Ba Swallow showed esoph stricture w/ dysphagia... last EGD 12/07 showed esophagitis & stricture dilated... he rec treatment w/ PPI & pt switched to PRILOSEC OTC 20mg /d- he tells me he averages Qod dosing. ~  2/14: he is in need of a routine colonoscopy 7 we will refer to GI...  DEGENERATIVE JOINT DISEASE (ICD-715.90) - he had right shoulder surgery 11/05 by DrMurphy w/ arthroscopy, debridement, distal clavicle resection. ~  7/12: he reports left shoulder problem w/ eval by DrMurphy & plans for Surg 9/12... ~  7/13: he tells me he had bilat shoulder surg's follow by second operations for EUA w/ ROM to lyse adhesions...  CHRONIC PAIN SYNDROME (ICD-338.4) - he had C3-4 spondylosis w/ HNP requiring ant cervical discectomy, decompression, foaminotomy, and fusion by [facility] 4/06... he tells me he is followed in a chronic pain clinic ([facility]) and treated w/ PERCOCET 7.5mg  Prn, CELEXA 10mg /d, & ZANAFLEX 4mg  Prn (we do not have notes from his pain doctors)...  ANXIETY (ICD-300.00) - on ALPRAZOLAM 0.5mg  Prn for nerves...   Past Surgical History  Procedure Laterality Date  . Right shoulder surgery   11/05    Arthroscopy, debridement, distal resection  . Cervical discectomy  4/06    decompression, foaminotomy, and fusion Dr. Jeral Fruit   . Left shoulder surgery  05/2011    Dr. Eulah Pont    Outpatient Encounter Prescriptions as of 10/22/2012  Medication Sig Dispense Refill  . ALPRAZolam (XANAX) 0.5 MG tablet Take 1 tablet (0.5 mg total) by mouth 3 (three) times daily as needed.  90 tablet  5  . aspirin 81 MG tablet Take one tablet by mouth three times a week      . citalopram (CELEXA) 10 MG tablet Take as directed      . fenofibrate 160 MG tablet TAKE 1 TABLET BY MOUTH EVERY DAY  30 tablet  5  . losartan (COZAAR) 100 MG tablet TAKE 1 TABLET BY MOUTH EVERY DAY  30 tablet  6  . omeprazole (PRILOSEC OTC) 20 MG tablet Take 20 mg by mouth daily.        Marland Kitchen oxyCODONE-acetaminophen (PERCOCET) 7.5-325 MG per tablet Take as directed by pain management       . azithromycin (ZITHROMAX) 250 MG tablet Take 2 on day one then 1 daily x 4 days  6 tablet  0   No facility-administered encounter medications on file as of 10/22/2012.    No Known Allergies   Current Medications, Allergies, Past Medical History, Past Surgical History, Family History, and Social History were reviewed in [employer] record.   Review of Systems        See HPI - all other systems neg except as noted...  The patient complains of chest pain and dyspnea on exertion.  The patient denies anorexia, fever, weight loss, weight gain, vision loss, decreased hearing, hoarseness, syncope, peripheral edema, prolonged cough, headaches, hemoptysis, abdominal pain, melena, hematochezia, severe indigestion/heartburn, hematuria, incontinence, muscle weakness, suspicious skin lesions, transient blindness, difficulty walking,  depression, unusual weight change, abnormal bleeding, enlarged lymph nodes, and angioedema.     Objective:   Physical Exam      WD, WN, 52 y/o WM in NAD... GENERAL:  Alert & oriented; pleasant &  cooperative... HEENT:  [place]/AT, EOM-wnl, PERRLA, Fundi-benign, EACs-clear, TMs-wnl, NOSE-clear, THROAT-clear & wnl. NECK:  Supple w/ decrROM; scar of CSpine surg; no JVD; normal carotid impulses w/o bruits;  no thyromegaly or nodules palpated; no lymphadenopathy. CHEST:  Clear to P & A; without wheezes/ rales/ or rhonchi. HEART:  Regular Rhythm; without murmurs/ rubs/ or gallops. ABDOMEN:  Soft & nontender; normal bowel sounds; no organomegaly or masses detected. RECTAL:  Boil on right buttock;  prostate 2+ & nontender w/o nodules; stool hematest neg. EXT: without deformities, mild arthritic changes; no varicose veins/ venous insuffic/ or edema. NEURO:  CN's intact; motor testing normal; sensory testing normal; gait normal & balance OK. DERM:  No lesions noted; no rash etc...  RADIOLOGY DATA:  Reviewed in the EPIC EMR & discussed w/ the patient...    >>CXR 4/11 showed heart size at the upper lim of norm, lungs clear, NAD...    >>EKG 4/11 showed NSR, rate 70/min, WNL/ NAD...  LABORATORY DATA:  Reviewed in the EPIC EMR & discussed w/ the patient...   Assessment & Plan:    Hx AB>  Breathing is good, at baseline w/o recent exac...  HBP>  BP controlled on Losartan Rx, when he takes it! Advised to take med DAILY & monitor BP at home...  AtypCWP>  On ASA, stable w/o recent exac, see above...  HYPERLIPIDEMIA>  He is back on his Feno160 + low chol/ low fat diet...  GI> GERD, Hx stricture, needs baseline colon> continue the PPI daily...  DJD, s/p left shoulder surg>  He's had bilat shoulder operations w/ second procedures for lysis of adhesions/ EUA...  Chronic Pain Symdrome>  Managed by [facility] as discussed...  ANXIETY>  He remains stable on ALPRAZOLAM prn...   Patient's Medications  New Prescriptions   AZITHROMYCIN (ZITHROMAX) 250 MG TABLET    Take 1 tablet (250 mg total) by mouth daily.  Previous Medications   ALPRAZOLAM (XANAX) 0.5 MG TABLET    Take 1 tablet (0.5  mg total) by mouth 3 (three) times daily as needed.   ASPIRIN 81 MG TABLET    Take one tablet by mouth three times a week   AZITHROMYCIN (ZITHROMAX) 250 MG TABLET    Take 2 on day one then 1 daily x 4 days   CITALOPRAM (CELEXA) 10 MG TABLET    Take as directed   FENOFIBRATE 160 MG TABLET    TAKE 1 TABLET BY MOUTH EVERY DAY   LOSARTAN (COZAAR) 100 MG TABLET    TAKE 1 TABLET BY MOUTH EVERY DAY   OMEPRAZOLE (PRILOSEC OTC) 20 MG TABLET    Take 20 mg by mouth daily.     OXYCODONE-ACETAMINOPHEN (PERCOCET) 7.5-325 MG PER TABLET    Take as directed by pain management   Modified Medications   No medications on file  Discontinued Medications   No medications on file

## 2012-10-22 NOTE — Patient Instructions (Signed)
Today we updated your med list in our EPIC system...    Continue your current medications the same...    We wrote for a ZPak to keep on hand...  Today we did your follow up CXR & FASTING blood work...    We will contact you w/ the results when avail...  Keep in close contact w/ your pain doctors about your back discomfort...  Call for any questions...  Let's plan a follow up visit in 6 months.Marland KitchenMarland Kitchen

## 2012-11-10 HISTORY — PX: COLONOSCOPY: SHX174

## 2012-11-12 ENCOUNTER — Other Ambulatory Visit: Payer: Self-pay | Admitting: Pulmonary Disease

## 2012-11-15 ENCOUNTER — Ambulatory Visit (AMBULATORY_SURGERY_CENTER): Payer: Medicare Other | Admitting: *Deleted

## 2012-11-15 VITALS — Ht 67.0 in | Wt 174.4 lb

## 2012-11-15 MED ORDER — NA SULFATE-K SULFATE-MG SULF 17.5-3.13-1.6 GM/177ML PO SOLN: 1.0000 | Freq: Once | ORAL | Status: DC

## 2012-11-15 NOTE — Progress Notes (Signed)
NO EGG OR SOY ALLERGY. NO PROBLEMS WITH SEDATION OR INTUBATION IN THE PAST. EWM  PT HAS MULTIPLE FOOD ALLERGIES THAT CAUSES ANAPHYLAXSIS. EWM

## 2012-11-22 ENCOUNTER — Telehealth: Payer: Self-pay | Admitting: Gastroenterology

## 2012-11-22 NOTE — Telephone Encounter (Signed)
Patient is sick and on a Z-pack.  Wants to know if he has to cancel his procedure on the 20th.

## 2012-11-22 NOTE — Telephone Encounter (Signed)
Returned call to patient. Pt states that he has a cold and will be finishing a zpack that was prescribed to him in two days. Advised that as long as he is feeling okay and not running any fever or showing any additional signs of illness he is okay to proceed with procedure.

## 2012-11-29 ENCOUNTER — Encounter: Payer: Self-pay | Admitting: Gastroenterology

## 2012-11-29 ENCOUNTER — Ambulatory Visit (AMBULATORY_SURGERY_CENTER): Payer: Medicare Other | Admitting: Gastroenterology

## 2012-11-29 VITALS — BP 134/81 | HR 67 | Temp 96.9°F | Resp 13 | Ht 67.0 in | Wt 174.0 lb

## 2012-11-29 DIAGNOSIS — D126 Benign neoplasm of colon, unspecified: Secondary | ICD-10-CM

## 2012-11-29 MED ORDER — SODIUM CHLORIDE 0.9 % IV SOLN: 500.0000 mL | INTRAVENOUS | Status: DC

## 2012-11-29 NOTE — Progress Notes (Signed)
Patient did not have preoperative order for IV antibiotic SSI prophylaxis. (G8918)  Patient did not experience any of the following events: a burn prior to discharge; a fall within the facility; wrong site/side/patient/procedure/implant event; or a hospital transfer or hospital admission upon discharge from the facility. (G8907)  

## 2012-11-29 NOTE — Progress Notes (Signed)
Report to pacu rn, vss, bbs=clear 

## 2012-11-29 NOTE — Patient Instructions (Addendum)
YOU HAD AN ENDOSCOPIC PROCEDURE TODAY AT THE [facility]: Refer to the procedure report that was given to you for any specific questions about what was found during the examination.  If the procedure report does not answer your questions, please call your gastroenterologist to clarify.  If you requested that your care partner not be given the details of your procedure findings, then the procedure report has been included in a sealed envelope for you to review at your convenience later.  YOU SHOULD EXPECT: Some feelings of bloating in the abdomen. Passage of more gas than usual.  Walking can help get rid of the air that was put into your GI tract during the procedure and reduce the bloating. If you had a lower endoscopy (such as a colonoscopy or flexible sigmoidoscopy) you may notice spotting of blood in your stool or on the toilet paper. If you underwent a bowel prep for your procedure, then you may not have a normal bowel movement for a few days.  DIET: Your first meal following the procedure should be a light meal and then it is ok to progress to your normal diet.  A half-sandwich or bowl of soup is an example of a good first meal.  Heavy or fried foods are harder to digest and may make you feel nauseous or bloated.  Likewise meals heavy in dairy and vegetables can cause extra gas to form and this can also increase the bloating.  Drink plenty of fluids but you should avoid alcoholic beverages for 24 hours.  ACTIVITY: Your care partner should take you home directly after the procedure.  You should plan to take it easy, moving slowly for the rest of the day.  You can resume normal activity the day after the procedure however you should NOT DRIVE or use heavy machinery for 24 hours (because of the sedation medicines used during the test).    SYMPTOMS TO REPORT IMMEDIATELY: A gastroenterologist can be reached at any hour.  During normal business hours, 8:30 AM to 5:00 PM Monday through Friday,  call [phone].  After hours and on weekends, please call the GI answering service at [phone] who will take a message and have the physician on call contact you.   Following lower endoscopy (colonoscopy or flexible sigmoidoscopy):  Excessive amounts of blood in the stool  Significant tenderness or worsening of abdominal pains  Swelling of the abdomen that is new, acute  Fever of 100F or higher    FOLLOW UP: If any biopsies were taken you will be contacted by phone or by letter within the next 1-3 weeks.  Call your gastroenterologist if you have not heard about the biopsies in 3 weeks.  Our staff will call the home number listed on your records the next business day following your procedure to check on you and address any questions or concerns that you may have at that time regarding the information given to you following your procedure. This is a courtesy call and so if there is no answer at the home number and we have not heard from you through the emergency physician on call, we will assume that you have returned to your regular daily activities without incident.  SIGNATURES/CONFIDENTIALITY: You and/or your care partner have signed paperwork which will be entered into your electronic medical record.  These signatures attest to the fact that that the information above on your After Visit Summary has been reviewed and is understood.  Full responsibility of the confidentiality   of this discharge information lies with you and/or your care-partner.     

## 2012-11-29 NOTE — Op Note (Signed)
[facility] [address] [place], [address]   COLONOSCOPY PROCEDURE REPORT  PATIENT: Edward Chen, Edward Chen  MR#: [id number] BIRTHDATE: 1960/11/05 , 51  yrs. old GENDER: Male ENDOSCOPIST: Louis Meckel, MD REFERRED AO:ZHYQM Kriste Basque, M.D. PROCEDURE DATE:  11/29/2012 PROCEDURE:   Colonoscopy with snare polypectomy ASA CLASS:   Class II INDICATIONS:average risk screening. MEDICATIONS:  DESCRIPTION OF PROCEDURE:   After the risks benefits and alternatives of the procedure were thoroughly explained, informed consent was obtained.  A digital rectal exam revealed no abnormalities of the rectum.   The LB [id number]  endoscope was introduced through the anus and advanced to the cecum, which was identified by both the appendix and ileocecal valve. No adverse events experienced.   The quality of the prep was Suprep excellent The instrument was then slowly withdrawn as the colon was fully examined.      COLON FINDINGS: A sessile polyp was found in the descending colon. A polypectomy was performed with a cold snare.  The resection was complete and the polyp tissue was completely retrieved.   Mild diverticulosis was noted in the sigmoid colon.   The colon mucosa was otherwise normal.  Retroflexed views revealed no abnormalities. The time to cecum=2 minutes 12 seconds.  Withdrawal time=9 minutes 02 seconds.  The scope was withdrawn and the procedure completed. COMPLICATIONS: There were no complications.  ENDOSCOPIC IMPRESSION: 1.   Sessile polyp was found in the descending colon; polypectomy was performed with a cold snare 2.   Mild diverticulosis was noted in the sigmoid colon 3.   The colon mucosa was otherwise normal  RECOMMENDATIONS: If the polyp(s) removed today are proven to be adenomatous (pre-cancerous) polyps, you will need a repeat colonoscopy in 5 years.  Otherwise you should continue to follow colorectal cancer screening guidelines for "routine risk"  patients with colonoscopy in 10 years.  You will receive a letter within 1-2 weeks with the results of your biopsy as well as final recommendations.  Please call my office if you have not received a letter after 3 weeks.   eSigned:  Louis Meckel, MD 11/29/2012 11:27 AM   cc:   PATIENT NAME:  [name], [name] MR#: [id number]

## 2012-11-30 ENCOUNTER — Telehealth: Payer: Self-pay | Admitting: *Deleted

## 2012-11-30 NOTE — Telephone Encounter (Signed)
Left message on number given in admitting to return call if problems or questions. ewm

## 2012-12-12 ENCOUNTER — Encounter: Payer: Self-pay | Admitting: Gastroenterology

## 2013-03-14 ENCOUNTER — Other Ambulatory Visit: Payer: Self-pay | Admitting: Pulmonary Disease

## 2013-03-24 ENCOUNTER — Other Ambulatory Visit: Payer: Self-pay | Admitting: Pulmonary Disease

## 2013-04-11 ENCOUNTER — Encounter: Payer: Self-pay | Admitting: Pulmonary Disease

## 2013-04-22 ENCOUNTER — Ambulatory Visit: Payer: Medicare Other | Admitting: Pulmonary Disease

## 2013-06-24 ENCOUNTER — Encounter: Payer: Self-pay | Admitting: Pulmonary Disease

## 2013-06-24 ENCOUNTER — Ambulatory Visit (INDEPENDENT_AMBULATORY_CARE_PROVIDER_SITE_OTHER): Payer: Medicare Other | Admitting: Pulmonary Disease

## 2013-06-24 VITALS — BP 120/84 | HR 78 | Temp 97.8°F | Ht 67.0 in | Wt 170.6 lb

## 2013-06-24 DIAGNOSIS — J209 Acute bronchitis, unspecified: Secondary | ICD-10-CM

## 2013-06-24 DIAGNOSIS — K219 Gastro-esophageal reflux disease without esophagitis: Secondary | ICD-10-CM

## 2013-06-24 DIAGNOSIS — E785 Hyperlipidemia, unspecified: Secondary | ICD-10-CM

## 2013-06-24 DIAGNOSIS — F411 Generalized anxiety disorder: Secondary | ICD-10-CM

## 2013-06-24 DIAGNOSIS — I10 Essential (primary) hypertension: Secondary | ICD-10-CM

## 2013-06-24 DIAGNOSIS — Z9189 Other specified personal risk factors, not elsewhere classified: Secondary | ICD-10-CM

## 2013-06-24 DIAGNOSIS — G894 Chronic pain syndrome: Secondary | ICD-10-CM

## 2013-06-24 DIAGNOSIS — M1990 Unspecified osteoarthritis, unspecified site: Secondary | ICD-10-CM

## 2013-06-24 MED ORDER — AZITHROMYCIN 250 MG PO TABS: ORAL_TABLET | ORAL | Status: DC

## 2013-06-24 MED ORDER — FENOFIBRATE 160 MG PO TABS: ORAL_TABLET | ORAL | Status: DC

## 2013-06-24 MED ORDER — LOSARTAN POTASSIUM 100 MG PO TABS: ORAL_TABLET | ORAL | Status: DC

## 2013-06-24 MED ORDER — ALPRAZOLAM 0.5 MG PO TABS: ORAL_TABLET | ORAL | Status: DC

## 2013-06-24 NOTE — Progress Notes (Signed)
Subjective:    Patient ID: Edward Chen, male    DOB: 1961-04-20, 53 y.o.   MRN: [id number]  HPI 52 y/o WM here for a follow up visit... he has multiple medical problems as noted below...  he is followed for his chronic neck, chest, arm, etc pain at [facility]...  ~  October 03, 2011:  373mo ROV & he indicates that he is stable, no new complaints or concerns;  He had his left shoulder surg 9/12 by DrMurphy (we do not have notes) & he says improved... Due for targeted labs today, he declines the Flu vaccine, see prob list below >>  LABS 1/13:  FLP- ok x TG=213 on Feno160;  Chems- wnl w/ BS=97 A1c=5.5  ~  April 10, 2012:  373mo ROV & add-on for boil on buttocks> thinks he strained something lifting a heavy fellow off the floor at church; but exam shows a boil in perirectal position on right butt cheek; we discussed need for SITZ baths in warm soapy water, KEFLEX 500mg  Tid x7d, Anusol cream as needed + stool softeners...    We reviewed prob list, meds, xrays and labs> see below for updates >> he tells me he hasn't been taking his Losartan regularly & asked to resume daily dosing; he sees Pain Management monthly (?DrKirshmayer) for his Percocet, Celexa, muscle relaxer...   ~  October 22, 2012:  373mo ROV & Edward Chen tells me he's been suffering w/ back pain x 4-23mo- actually right post CWP & back pain, notes it was worse crawling under a house to fix duct, better w/ rest/ heat; he is under the care of [facility] on Percocet7.5 prn...    AB> he wants ZPak to keep on hand; breathing is good w/o cough, sput, hemoptysis, wheezing, SOB, edema, etc...    HBP, AtypCP, Palpit> on ASA81, Losar100; BP= 122/88 & he denies angina, palpit, ch in SOB, edema, etc...    Hyperlipid> on Feno160; FLP shows TChol 185, TG 302, HDL 32, LDL 113; not at goals & needs better low fat diet, get wt down...    BorderlineDM> on diet alone; Labs 2/14 showed BS=104, A1c= 5.1 and advised on wt reduction...     GERD> on Prilosec20; he is stable on the PPI & denies abd pain, n/v, c/d, blood seen; in need of screening colonoscopy & we will refer to DrKaplan...     DJD> degen spurring in Tspine on XRays; s/p bilat shoulder surg by DrMurphy...    Chronic Pain Syndrome> on Percocet7.5; prev Cspine surg w/ ant fusion plate; he remains under the care of [facility]...    Anxiety> on Xanax0.5, Celexa10; he says these help w/ his panic attacks... We reviewed prob list, meds, xrays and labs> see below for updates >> OK TDAP today; needs colon & he will sched; ok ZPak for prn use... CXR 2/14 showed heart at upper lim of normal, lungs clear, NAD... LABS 2/14:  FLP- not at goals on Feno160, needs better low fat diet;  Chems- wnl;  CBC- wnl;  TSH=1.62;  PSA=0.53...  ~  June 24, 2013:  73mo ROV & here to complete disability papers...    AB> he wants ZPak to keep on hand; breathing is good w/o cough, sput, hemoptysis, wheezing, SOB, edema, etc...    HBP, AtypCP, Palpit> on ASA81, Losar100; BP= 120/84 & he denies angina, palpit, ch in SOB, edema, etc...    Hyperlipid> on Feno160; FLP 2/14 showed TChol 185, TG 302,  HDL 32, LDL 113; not at goals & needs better low fat diet, get wt down...    BorderlineDM> on diet alone; Labs 2/14 showed BS=104, A1c= 5.1 and advised on wt reduction...    GERD> on Prilosec20; he is stable on the PPI & denies abd pain, n/v, c/d, blood seen; he had screening colonoscopy done 3/14 by DrKaplan- mild divertics and one sessile polyp in desc colon, bx= tub adenoma & repeat sched for 45yrs.    DJD> degen spurring in Tspine on XRays; s/p bilat shoulder surg by DrMurphy...    Chronic Pain Syndrome> on Percocet7.5 from Pain management; prev Cspine surg w/ ant fusion plate; he remains under the care of [facility]...    Anxiety> on Xanax0.5, Celexa10; he says these help w/ his panic attacks... We reviewed prob list, meds, xrays and labs> see below for updates >> he refuses the  2014 Flu vaccine... Disability papaers filled out today w/ pt's assistance...           Problem List:   PHYSICAL EXAMINATION (ICD-V70.0) - pt didn't bring med bottles or list of his current medications today- he states that he knows his meds. ~  GI:  he has been followed by DrKaplan for EGD/ stricture dil 2007... needs routine colonoscopy. ~  GU:  no hx prostate problems;  PSA 2/14 = 0.53; c/o ED & Rx w/ Cialis trial. ~  Immunizations:  he has declined Flu vaccine but we gave him TDAP 2/14... ~  Disability papers filled out at pt request 10/14...  ASTHMATIC BRONCHITIS, ACUTE (ICD-466.0) - prev on Singulair but stopped due to $$; He was prev eval by DrESL in 2001 w/ mult meds- Advair, Zyrtek, Nasonex, Astelin, etc; He is asymptomatic w/o cough, sputum, SOB, etc... ~  CXR 2/10 showed normal heart size & clear lungs... ~  He had a pre-age-43 Pneumovax in 2011... ~  f/u CXR 4/11 showed clear lungs, heart at upper limits of norm, NAD. ~  CXR 2/14 showed heart at upper lim of normal, lungs clear, NAD...  HYPERTENSION, BENIGN (ICD-401.1) - on LOSARTAN 100mg /d & low sodium diet...  ~  7/12:  BP= 110/70 today but not checking BP at home... denies HA, fatigue, visual changes, CP, palipit, dizziness, syncope, dyspnea, edema, etc... ~  1/13:  BP= 120/88 & he admits not taking Losartan daily... ~  7/13:  BP= 130/92 & still not taking Losartan regularly; asked to take this med every day! ~  2/14: on ASA81, Losar100; BP= 122/88 & he denies angina, palpit, ch in SOB, edema, etc... ~  10/14: on ASA81, Losar100; BP= 120/84 & he denies angina, palpit, ch in SOB, edema, etc  CHEST PAIN, ATYPICAL, HX OF (ICD-V15.89) & PALPITATIONS, HX OF (ICD-V12.50) - on ASA 81mg /d...  he had a cardiac eval in the 90's for these symptoms and mult risk factors including +FamHx by DrGrodecki w/ baseline EKG showing NSSTTWA;  2DEcho was neg w/ norm LV size & function- EF= 60%, no MVP, mild MR;  StressTest was neg x for occas  PVC's;  Event Recorder w/ lots of symptoms but only occas PVC's and PAC';  treated w/ rec for smoking cessation, no caffeine, Tranxene Prn... ~  NuclearStressTest 2005 was neg- no ischemia, no infarct... EF= 54%. ~  He saw several Orthopedists, DrZ for Rheum, & DrWeymann (neg EMG) in 2005 due to his atyp CWP. ~  EKG 4/11 showed NSR, WNL, NAD...  HYPERLIPIDEMIA (ICD-272.4) - on FENOFIBRATE 160mg /d + low chol/ low fat  diet... ~  FLP 9/07 (wt= 178#) on diet alone showed TChol 197, TG 408, HDL 31, LDL 93... Fibrate Rx started w/ Tricor. ~  FLP 2/10 (wt= 186#) on Tricor145 showed TChol 201, TG 278, HDL 30, LDL 134... needs better diet & may need statin. ~  FLP 4/11 (wt=185#) on Tricor145 showed TChol 164, TG 263, HDL 33, LDL 53... change to FENOFIBRATE 160mg /d for $$. ~  FLP 12/11 (wt=177#) on Feno160 showed TChol 227, TG 128, HDL 41, LDL 151... TG is great but Chol is up, may need statin! ~  FLP 6/12 off Fenofibrate x4mo showed TChol 169, TG 284, HDL 35, LDL 112... Get back on FENOFIBRATE 160mg /d. ~  FLP 1/13 on Feno160 showed TChol 170, TG 213, HDL 35, LDL 99... Continue same + better diet, gt wt down! ~  FLP 2/14 on Feno160 showed TChol 185, TG 302, HDL 32, LDL 113; not at goals & needs better low fat diet, get wt down.  DIABETES MELLITUS, BORDERLINE (ICD-790.29) - on diet Rx alone> we discussed diet + exercise program... ~  prev labs showed FBS= 121... diet Rx discussed. ~  labs 2/10 showed BS= 107, HgA1c= 5.6 ~  labs 4/11 showed BS= 112, A1c= 5.3 ~  labs 12/11 showed BS= 107 ~  Labs 6/12 showed BS= 113, A1c= 5.6 ~  Labs 1/13 showed BS= 97, A1c= 5.5 ~  Labs 2/14 on diet alone showed BS= 104, A1c= 5.1  GERD (ICD-530.81) >>  Diverticulosis >> Colon Polyps >> ~  followed by DrKaplan- eval in 2007 w/ BaSwallow showed esoph stricture w/ dysphagia; last EGD 12/07 showed esophagitis & stricture dilated; he rec treatment w/ PPI & pt switched to PRILOSEC OTC 20mg /d- he tells me he averages 1Qod...   ~  3/14:  Routine screening colonoscopy by DrKaplan showed mild divertics and one sessile polyp in desc colon, bx= tub adenoma & repeat sched for 64yrs...  DEGENERATIVE JOINT DISEASE (ICD-715.90) - he had right shoulder surgery 11/05 by DrMurphy w/ arthroscopy, debridement, distal clavicle resection. ~  7/12: he reports left shoulder problem w/ eval by DrMurphy & plans for Surg 9/12... ~  7/13: he tells me he had bilat shoulder surg's follow by second operations for EUA w/ ROM to lyse adhesions...  CHRONIC PAIN SYNDROME (ICD-338.4) - he had C3-4 spondylosis w/ HNP requiring ant cervical discectomy, decompression, foaminotomy, and fusion by [facility] 4/06... he tells me he is followed in a chronic pain clinic ([facility]) and treated w/ PERCOCET 7.5mg  Prn, CELEXA 10mg /d, & ZANAFLEX 4mg  Prn (we do not have notes from his pain doctors)...  ANXIETY (ICD-300.00) - on ALPRAZOLAM 0.5mg  Prn for nerves...   Past Surgical History  Procedure Laterality Date  . Right shoulder surgery  11/05    Arthroscopy, debridement, distal resection  . Cervical discectomy  4/06    decompression, foaminotomy, and fusion Dr. Jeral Fruit   . Left shoulder surgery  05/2011    Dr. Eulah Pont  . Upper gastrointestinal endoscopy      X3    Outpatient Encounter Prescriptions as of 06/24/2013  Medication Sig Dispense Refill  . ALPRAZolam (XANAX) 0.5 MG tablet TAKE 1 TABLET THREE TIMES DAILY AS NEEDED  90 tablet  5  . aspirin 81 MG tablet Take one tablet by mouth three times a week      . citalopram (CELEXA) 10 MG tablet Take as directed      . fenofibrate 160 MG tablet TAKE 1 TABLET BY MOUTH EVERY DAY  30 tablet  4  . losartan (COZAAR) 100 MG tablet TAKE 1 TABLET BY MOUTH EVERY DAY  30 tablet  2  . omeprazole (PRILOSEC OTC) 20 MG tablet Take 20 mg by mouth daily.        Marland Kitchen oxyCODONE-acetaminophen (PERCOCET) 7.5-325 MG per tablet Take as directed by pain management        No facility-administered encounter  medications on file as of 06/24/2013.    Allergies  Allergen Reactions  . Other Anaphylaxis    BLACKBERRIES  . Peanuts [Peanut Oil] Anaphylaxis  . Shellfish Allergy Anaphylaxis    Current Medications, Allergies, Past Medical History, Past Surgical History, Family History, and Social History were reviewed in [employer] record.   Review of Systems        See HPI - all other systems neg except as noted...  The patient complains of chest pain and dyspnea on exertion.  The patient denies anorexia, fever, weight loss, weight gain, vision loss, decreased hearing, hoarseness, syncope, peripheral edema, prolonged cough, headaches, hemoptysis, abdominal pain, melena, hematochezia, severe indigestion/heartburn, hematuria, incontinence, muscle weakness, suspicious skin lesions, transient blindness, difficulty walking, depression, unusual weight change, abnormal bleeding, enlarged lymph nodes, and angioedema.     Objective:   Physical Exam      WD, WN, 52 y/o WM in NAD... GENERAL:  Alert & oriented; pleasant & cooperative... HEENT:  [place]/AT, EOM-wnl, PERRLA, Fundi-benign, EACs-clear, TMs-wnl, NOSE-clear, THROAT-clear & wnl. NECK:  Supple w/ decrROM; scar of CSpine surg; no JVD; normal carotid impulses w/o bruits;  no thyromegaly or nodules palpated; no lymphadenopathy. CHEST:  Clear to P & A; without wheezes/ rales/ or rhonchi. HEART:  Regular Rhythm; without murmurs/ rubs/ or gallops. ABDOMEN:  Soft & nontender; normal bowel sounds; no organomegaly or masses detected. RECTAL:  Boil on right buttock;  prostate 2+ & nontender w/o nodules; stool hematest neg. EXT: without deformities, mild arthritic changes; no varicose veins/ venous insuffic/ or edema. NEURO:  CN's intact; motor testing normal; sensory testing normal; gait normal & balance OK. DERM:  No lesions noted; no rash etc...  RADIOLOGY DATA:  Reviewed in the EPIC EMR & discussed w/ the patient...   LABORATORY  DATA:  Reviewed in the EPIC EMR & discussed w/ the patient...   Assessment & Plan:    Hx AB>  Breathing is good, at baseline w/o recent exac...  HBP>  BP controlled on Losartan Rx, when he takes it! Advised to take med DAILY & monitor BP at home...  AtypCWP>  On ASA, stable w/o recent exac, see above...  HYPERLIPIDEMIA>  He is back on his Feno160 + low chol/ low fat diet... Needs wt reduction to improve the TG...  GI> GERD, Hx stricture, Divertics, Colon Polyps> continue the PPI daily... Screening colon done 3/14 and f/u due 57yrs...  DJD, s/p left shoulder surg>  He's had bilat shoulder operations w/ second procedures for lysis of adhesions/ EUA...  Chronic Pain Symdrome>  Managed by [facility] as discussed...  ANXIETY>  He remains stable on ALPRAZOLAM prn...   Patient's Medications  New Prescriptions   AZITHROMYCIN (ZITHROMAX) 250 MG TABLET    Take as directed  Previous Medications   ASPIRIN 81 MG TABLET    Take one tablet by mouth three times a week   CITALOPRAM (CELEXA) 10 MG TABLET    Take as directed   OMEPRAZOLE (PRILOSEC OTC) 20 MG TABLET    Take 20 mg by mouth daily.     OXYCODONE-ACETAMINOPHEN (PERCOCET) 7.5-325 MG  PER TABLET    Take as directed by pain management   Modified Medications   Modified Medication Previous Medication   ALPRAZOLAM (XANAX) 0.5 MG TABLET ALPRAZolam (XANAX) 0.5 MG tablet      TAKE 1 TABLET THREE TIMES DAILY AS NEEDED    TAKE 1 TABLET THREE TIMES DAILY AS NEEDED   FENOFIBRATE 160 MG TABLET fenofibrate 160 MG tablet      TAKE 1 TABLET BY MOUTH EVERY DAY    TAKE 1 TABLET BY MOUTH EVERY DAY   LOSARTAN (COZAAR) 100 MG TABLET losartan (COZAAR) 100 MG tablet      TAKE 1 TABLET BY MOUTH EVERY DAY    TAKE 1 TABLET BY MOUTH EVERY DAY  Discontinued Medications   No medications on file

## 2013-06-24 NOTE — Patient Instructions (Addendum)
Today we updated your med list in our EPIC system...    Continue your current medications the same...    We refilled the meds your requested and a ZPak for as needed use this winter...  Today we completed your [employer] disability papers...    Thank you for your assitance getting this information correct...  Call for any questions...  Let's plan a follow up visit in 56mo, sooner if needed for problems.Marland KitchenMarland Kitchen

## 2013-07-10 ENCOUNTER — Ambulatory Visit
Admission: RE | Admit: 2013-07-10 | Discharge: 2013-07-10 | Disposition: A | Discharge status: Home / Self Care | Payer: Medicare Other | Source: Ambulatory Visit | Attending: Physical Medicine and Rehabilitation | Admitting: Physical Medicine and Rehabilitation

## 2013-07-10 ENCOUNTER — Other Ambulatory Visit: Payer: Self-pay | Admitting: Physical Medicine and Rehabilitation

## 2013-07-10 DIAGNOSIS — M542 Cervicalgia: Secondary | ICD-10-CM

## 2013-09-09 ENCOUNTER — Telehealth: Payer: Self-pay | Admitting: Pulmonary Disease

## 2013-09-09 MED ORDER — AZITHROMYCIN 250 MG PO TABS: ORAL_TABLET | ORAL | Status: DC

## 2013-09-09 NOTE — Telephone Encounter (Signed)
Pt aware of recs. RX sent. Nothing further needed 

## 2013-09-09 NOTE — Telephone Encounter (Signed)
Per SN---  Ok for zpak #1 take as directed with 2 refills.

## 2013-09-09 NOTE — Telephone Encounter (Signed)
Spoke with pt. Requesting ZPAK. C/o slight dry cough, runny nose, OND, scratchy throat, wheezing, chest tx x couple days. No fever. Denies any increase SOB., please advise SN thanks  Allergies  Allergen Reactions  . Other Anaphylaxis    BLACKBERRIES  . Peanuts [Peanut Oil] Anaphylaxis  . Shellfish Allergy Anaphylaxis    Current Outpatient Prescriptions on File Prior to Visit  Medication Sig Dispense Refill  . ALPRAZolam (XANAX) 0.5 MG tablet TAKE 1 TABLET THREE TIMES DAILY AS NEEDED  90 tablet  5  . aspirin 81 MG tablet Take one tablet by mouth three times a week      . azithromycin (ZITHROMAX) 250 MG tablet Take as directed  6 tablet  0  . citalopram (CELEXA) 10 MG tablet Take as directed      . fenofibrate 160 MG tablet TAKE 1 TABLET BY MOUTH EVERY DAY  30 tablet  11  . losartan (COZAAR) 100 MG tablet TAKE 1 TABLET BY MOUTH EVERY DAY  30 tablet  11  . omeprazole (PRILOSEC OTC) 20 MG tablet Take 20 mg by mouth daily.        Marland Kitchen oxyCODONE-acetaminophen (PERCOCET) 7.5-325 MG per tablet Take as directed by pain management        No current facility-administered medications on file prior to visit.

## 2013-10-24 ENCOUNTER — Telehealth: Payer: Self-pay | Admitting: Gastroenterology

## 2013-10-24 NOTE — Telephone Encounter (Signed)
Pt had colon done several months ago and wants to know if prostate was checked at that time. Discussed with pt that the prostate is not examined during a colon. Suggested he check with his PCP.

## 2013-11-20 ENCOUNTER — Other Ambulatory Visit: Payer: Self-pay | Admitting: Pulmonary Disease

## 2013-11-20 DIAGNOSIS — I10 Essential (primary) hypertension: Secondary | ICD-10-CM

## 2013-11-26 ENCOUNTER — Telehealth: Payer: Self-pay | Admitting: Pulmonary Disease

## 2013-11-26 NOTE — Telephone Encounter (Signed)
Called and spoke with pt and he is aware of appt scheduled at [facility] on 5/19 at 3 pm with Dr. Leo Grosser.  Pt is aware of appt.  Nothing further is needed.

## 2013-12-23 ENCOUNTER — Ambulatory Visit: Payer: Medicare Other | Admitting: Pulmonary Disease

## 2014-01-03 ENCOUNTER — Telehealth: Payer: Self-pay | Admitting: Gastroenterology

## 2014-01-03 NOTE — Telephone Encounter (Signed)
Pt states he is having problems with reflux and bloating and would like to be seen by Dr. Deatra Ina. Pt scheduled to see Dr. Deatra Ina 01/21/14@10 :30am. Pt aware of appt.

## 2014-01-21 ENCOUNTER — Encounter: Payer: Self-pay | Admitting: Gastroenterology

## 2014-01-21 ENCOUNTER — Ambulatory Visit (INDEPENDENT_AMBULATORY_CARE_PROVIDER_SITE_OTHER): Payer: Medicare Other | Admitting: Gastroenterology

## 2014-01-21 VITALS — BP 114/80 | HR 88 | Ht 67.0 in | Wt 175.0 lb

## 2014-01-21 DIAGNOSIS — R1013 Epigastric pain: Secondary | ICD-10-CM

## 2014-01-21 DIAGNOSIS — K3184 Gastroparesis: Secondary | ICD-10-CM

## 2014-01-21 MED ORDER — HYOSCYAMINE SULFATE ER 0.375 MG PO TBCR: EXTENDED_RELEASE_TABLET | ORAL | Status: DC

## 2014-01-21 NOTE — Assessment & Plan Note (Signed)
Immediate upper abdominal lower chest pain with swallowing raises the question of esophagitis or a recurrent stricture, even in the absence of dysphagia.  Patient takes omeprazole.  I recommended to the patient that he undergo upper endoscopy but he wishes to try empiric therapy first.  Accordingly, I will place him on hyomax 0.375 mg twice a day for several days.  If not improved he is agreeable to proceed with upper endoscopy.

## 2014-01-21 NOTE — Patient Instructions (Signed)
Call back in one week to report progress Follow up in 6-8 weeks

## 2014-01-21 NOTE — Assessment & Plan Note (Addendum)
Abdominal bloating and nausea are likely related to gastroparesis.  Recommendations #1 unfortunately cannot use either Reglan or erythromycin with Celexa because of drug interactions #2 gastroparesis diet

## 2014-01-21 NOTE — Progress Notes (Signed)
_                                                                                                                History of Present Illness: A 53 year old white male referred for evaluation of chest discomfort.  For several weeks she's been complaining of immediate postprandial pain in his lower chest and upper abdomen.  This may last for up to 15 minutes.  He denies dysphagia, per se.  He has a history of esophageal stricture.  He has rare pyrosis.  He also is complaining of postprandial abdominal bloating and some nausea.  He has taken Reglan in the past for gastroparesis established by gastric emptying scan.  Had recent colonoscopy and adenomas polyp was removed.    Past Medical History  Diagnosis Date  . Acute asthmatic bronchitis   . Chest pain, atypical   . Benign hypertension   . Palpitations   . Hyperlipidemia   . Borderline diabetes mellitus   . GERD (gastroesophageal reflux disease)   . Gastroparesis   . DJD (degenerative joint disease)   . Chronic pain syndrome   . Anxiety    Past Surgical History  Procedure Laterality Date  . Right shoulder surgery  11/05    Arthroscopy, debridement, distal resection  . Cervical discectomy  4/06    decompression, foaminotomy, and fusion Dr. Joya Salm   . Left shoulder surgery  05/2011    Dr. Percell Miller  . Upper gastrointestinal endoscopy      X3   family history includes Lung disease in his father. There is no history of Colon cancer, Rectal cancer, or Stomach cancer. Current Outpatient Prescriptions  Medication Sig Dispense Refill  . ALPRAZolam (XANAX) 0.5 MG tablet TAKE 1 TABLET THREE TIMES DAILY AS NEEDED  90 tablet  5  . citalopram (CELEXA) 10 MG tablet Take as directed      . fenofibrate 160 MG tablet TAKE 1 TABLET BY MOUTH EVERY DAY  30 tablet  11  . losartan (COZAAR) 100 MG tablet TAKE 1 TABLET BY MOUTH EVERY DAY  30 tablet  11  . omeprazole (PRILOSEC OTC) 20 MG tablet Take 20 mg by mouth daily.        Marland Kitchen  oxyCODONE-acetaminophen (PERCOCET) 7.5-325 MG per tablet Take as directed by pain management        No current facility-administered medications for this visit.   Allergies as of 01/21/2014 - Review Complete 01/21/2014  Allergen Reaction Noted  . Other Anaphylaxis 11/15/2012  . Peanuts [peanut oil] Anaphylaxis 11/15/2012  . Shellfish allergy Anaphylaxis 11/15/2012    reports that he quit smoking about 6 years ago. His smoking use included Cigarettes. He smoked 0.00 packs per day for 20 years. He has never used smokeless tobacco. He reports that he drinks about 7.2 ounces of alcohol per week. He reports that he does not use illicit drugs.     Review of Systems: Pertinent positive and negative review of systems were noted in the above HPI section. All other review of  systems were otherwise negative.  Vital signs were reviewed in today's medical record Physical Exam: General: Well developed , well nourished, no acute distress Skin: anicteric Head: Normocephalic and atraumatic Eyes:  sclerae anicteric, EOMI Ears: Normal auditory acuity Mouth: No deformity or lesions Neck: Supple, no masses or thyromegaly Lungs: Clear throughout to auscultation Heart: Regular rate and rhythm; no murmurs, rubs or bruits Abdomen: Soft, non tender and non distended. No masses, hepatosplenomegaly or hernias noted. Normal Bowel sounds.  There is no succussion splash Rectal:deferred Musculoskeletal: Symmetrical with no gross deformities  Skin: No lesions on visible extremities Pulses:  Normal pulses noted Extremities: No clubbing, cyanosis, edema or deformities noted Neurological: Alert oriented x 4, grossly nonfocal Cervical Nodes:  No significant cervical adenopathy Inguinal Nodes: No significant inguinal adenopathy Psychological:  Alert and cooperative. Normal mood and affect  See Assessment and Plan under Problem List

## 2014-01-28 ENCOUNTER — Encounter: Payer: Self-pay | Admitting: Family Medicine

## 2014-01-28 ENCOUNTER — Ambulatory Visit (INDEPENDENT_AMBULATORY_CARE_PROVIDER_SITE_OTHER): Payer: Medicare Other | Admitting: Family Medicine

## 2014-01-28 VITALS — BP 130/80 | HR 96 | Temp 97.9°F | Wt 173.8 lb

## 2014-01-28 DIAGNOSIS — K3184 Gastroparesis: Secondary | ICD-10-CM

## 2014-01-28 DIAGNOSIS — G894 Chronic pain syndrome: Secondary | ICD-10-CM

## 2014-01-28 DIAGNOSIS — Z9189 Other specified personal risk factors, not elsewhere classified: Secondary | ICD-10-CM

## 2014-01-28 DIAGNOSIS — Z125 Encounter for screening for malignant neoplasm of prostate: Secondary | ICD-10-CM

## 2014-01-28 DIAGNOSIS — E785 Hyperlipidemia, unspecified: Secondary | ICD-10-CM

## 2014-01-28 DIAGNOSIS — F411 Generalized anxiety disorder: Secondary | ICD-10-CM

## 2014-01-28 DIAGNOSIS — I10 Essential (primary) hypertension: Secondary | ICD-10-CM

## 2014-01-28 NOTE — Progress Notes (Signed)
Pre visit review using our clinic review tool, if applicable. No additional management support is needed unless otherwise documented below in the visit note. 

## 2014-01-28 NOTE — Progress Notes (Signed)
BP 130/80  Pulse 96  Temp(Src) 97.9 F (36.6 C) (Oral)  Wt 173 lb 12 oz (78.812 kg)   CC: new pt to establish Subjective:    Patient ID: Edward Chen, male    DOB: 11/02/1960, 53 y.o.   MRN: [id number]  HPI: Edward Chen is a 53 y.o. male presenting on 01/28/2014 for Establish Care   Prior saw Dr. Lenna [facility]. New pt presents today to establish care - placed in 15 min slot.  Pt upset that I had to step out during our office visit to manage an emergency.  Gastroparesis and possible esophageal stricture - sees Dr. Deatra Ina for this.  Currently undergoing medical treatment with hyoscyamine.  Also taking omeprazole 20mg  daily.  Chronic pain syndrome - has had spine surgeries in past and is on chronic narcotics.  Followed by [facility].  Asthmatic bronchitis - states tends to get recurrent bronchitis that leads to asthma flares unless treated quickly  HTN - compliant with losartan 100mg  daily.  HLD - fenofibrate 160mg  daily. Anxiety with attacks and mild depression - on citalopram 10mg  daily along with xanax 0.5mg  prn.   Chest pain - states had been evaluated by cardiology and found to be anxiety related.  Last cardiac evaluation was several years ago.  Ongoing persistent chest pain sxs.  Preventative: Last CPE unsure COLONOSCOPY Date: 11/2012 1 TA, mod diverticulosis, rpt 5 yrs Deatra Ina) Flu 2005 Pneumovax 2011   Relevant past medical, surgical, family and social history reviewed and updated as indicated.  Allergies and medications reviewed and updated. Current Outpatient Prescriptions on File Prior to Visit  Medication Sig  . ALPRAZolam (XANAX) 0.5 MG tablet TAKE 1 TABLET THREE TIMES DAILY AS NEEDED  . citalopram (CELEXA) 10 MG tablet Take 10 mg by mouth daily.   . fenofibrate 160 MG tablet TAKE 1 TABLET BY MOUTH EVERY DAY  . Hyoscyamine Sulfate 0.375 MG TBCR Take one tab twice a day for 5 days, abdominal pain, then as needed  . losartan (COZAAR) 100 MG tablet TAKE 1  TABLET BY MOUTH EVERY DAY  . omeprazole (PRILOSEC OTC) 20 MG tablet Take 20 mg by mouth daily.    Marland Kitchen oxyCODONE-acetaminophen (PERCOCET) 7.5-325 MG per tablet Take as directed by pain management    No current facility-administered medications on file prior to visit.    Review of Systems Per HPI unless specifically indicated above    Objective:    BP 130/80  Pulse 96  Temp(Src) 97.9 F (36.6 C) (Oral)  Wt 173 lb 12 oz (78.812 kg)  Physical Exam  Nursing note and vitals reviewed. Constitutional: He appears well-developed and well-nourished. No distress.  HENT:  Mouth/Throat: Oropharynx is clear and moist. No oropharyngeal exudate.  Eyes: Conjunctivae and EOM are normal. Pupils are equal, round, and reactive to light. No scleral icterus.  Neck: Normal range of motion. Neck supple. No thyromegaly present.  Cardiovascular: Normal rate, regular rhythm, normal heart sounds and intact distal pulses.   No murmur heard. Pulmonary/Chest: Effort normal and breath sounds normal. No respiratory distress. He has no wheezes. He has no rales.  Musculoskeletal: He exhibits no edema.  Lymphadenopathy:    He has no cervical adenopathy.  Skin: Skin is warm and dry. No rash noted.  Psychiatric: He has a normal mood and affect.      Assessment & Plan:   Problem List Items Addressed This Visit   HYPERLIPIDEMIA     Chronic, continue fibrate.  Will check FLP next fasting  blood work.    ANXIETY     With depression and anxiety attacks Stable on celexa and xanax prn.    Chronic pain syndrome     Followed by [facility].    HYPERTENSION, BENIGN - Primary     Chronic, stable. Continue losartan 100mg  daily.    Gastroparesis   CHEST PAIN, ATYPICAL, HX OF     Per patient has had unrevealing cardiac evaluation - told anxiety attack related.        Follow up plan: Return in about 3 months (around 04/30/2014), or as needed, for wellness exam..

## 2014-01-28 NOTE — Patient Instructions (Addendum)
Look at lubricating eye drops for blepharitis and continue warm compresses. Return at your convenience fasting for blood work and schedule wellness exam in next 3-4 months. Good to meet you today, call us with questions.

## 2014-01-28 NOTE — Assessment & Plan Note (Signed)
Chronic, continue fibrate.  Will check FLP next fasting blood work.

## 2014-01-28 NOTE — Assessment & Plan Note (Signed)
Followed by [facility].

## 2014-01-28 NOTE — Assessment & Plan Note (Signed)
With depression and anxiety attacks Stable on celexa and xanax prn.

## 2014-01-28 NOTE — Assessment & Plan Note (Signed)
Chronic, stable.  Continue losartan 100 mg daily 

## 2014-01-28 NOTE — Assessment & Plan Note (Signed)
Per patient has had unrevealing cardiac evaluation - told anxiety attack related.

## 2014-01-30 ENCOUNTER — Other Ambulatory Visit (INDEPENDENT_AMBULATORY_CARE_PROVIDER_SITE_OTHER): Payer: Medicare Other

## 2014-01-30 DIAGNOSIS — Z125 Encounter for screening for malignant neoplasm of prostate: Secondary | ICD-10-CM

## 2014-01-30 DIAGNOSIS — K3184 Gastroparesis: Secondary | ICD-10-CM

## 2014-01-30 DIAGNOSIS — E785 Hyperlipidemia, unspecified: Secondary | ICD-10-CM

## 2014-01-30 LAB — LIPID PANEL
CHOLESTEROL: 167 mg/dL (ref 0–200)
HDL: 29.8 mg/dL — AB (ref >39.00)
LDL Cholesterol: 64 mg/dL (ref 0–99)
Total CHOL/HDL Ratio: 6
Triglycerides: 367 mg/dL — ABNORMAL HIGH (ref 0.0–149.0)
VLDL: 73.4 mg/dL — ABNORMAL HIGH (ref 0.0–40.0)

## 2014-01-30 LAB — COMPREHENSIVE METABOLIC PANEL
ALT: 39 U/L (ref 0–53)
AST: 25 U/L (ref 0–37)
Albumin: 3.8 g/dL (ref 3.5–5.2)
Alkaline Phosphatase: 51 U/L (ref 39–117)
BUN: 20 mg/dL (ref 6–23)
CALCIUM: 9.3 mg/dL (ref 8.4–10.5)
CHLORIDE: 102 meq/L (ref 96–112)
CO2: 31 meq/L (ref 19–32)
CREATININE: 1.2 mg/dL (ref 0.4–1.5)
GFR: 67.45 mL/min (ref >60.00)
Glucose, Bld: 101 mg/dL — ABNORMAL HIGH (ref 70–99)
Potassium: 4.2 mEq/L (ref 3.5–5.1)
Sodium: 139 mEq/L (ref 135–145)
Total Bilirubin: 0.7 mg/dL (ref 0.2–1.2)
Total Protein: 6.7 g/dL (ref 6.0–8.3)

## 2014-01-30 LAB — CBC WITH DIFFERENTIAL/PLATELET
BASOS PCT: 0.7 % (ref 0.0–3.0)
Basophils Absolute: 0 10*3/uL (ref 0.0–0.1)
EOS PCT: 2.6 % (ref 0.0–5.0)
Eosinophils Absolute: 0.2 10*3/uL (ref 0.0–0.7)
HCT: 43.5 % (ref 39.0–52.0)
Hemoglobin: 15.2 g/dL (ref 13.0–17.0)
LYMPHS PCT: 35.9 % (ref 12.0–46.0)
Lymphs Abs: 2.2 10*3/uL (ref 0.7–4.0)
MCHC: 34.8 g/dL (ref 30.0–36.0)
MCV: 93.3 fl (ref 78.0–100.0)
MONOS PCT: 7.9 % (ref 3.0–12.0)
Monocytes Absolute: 0.5 10*3/uL (ref 0.1–1.0)
Neutro Abs: 3.3 10*3/uL (ref 1.4–7.7)
Neutrophils Relative %: 52.9 % (ref 43.0–77.0)
Platelets: 163 10*3/uL (ref 150.0–400.0)
RBC: 4.67 Mil/uL (ref 4.22–5.81)
RDW: 12.9 % (ref 11.5–15.5)
WBC: 6.2 10*3/uL (ref 4.0–10.5)

## 2014-01-30 LAB — PSA: PSA: 0.53 ng/mL (ref 0.10–4.00)

## 2014-02-04 ENCOUNTER — Telehealth: Payer: Self-pay | Admitting: Gastroenterology

## 2014-02-04 NOTE — Telephone Encounter (Signed)
Schedule EGD with possible dilatation

## 2014-02-04 NOTE — Telephone Encounter (Signed)
Pt states the medication he was given has not helped. States he is still hurting in his chest after he eats, pt was told to call back in a week with an update. Pt states Dr. Deatra Ina told him he may need an EGD with dil. Please advise.

## 2014-02-05 NOTE — Telephone Encounter (Signed)
Left message for pt to call back  °

## 2014-02-06 NOTE — Telephone Encounter (Signed)
Pt scheduled for previsit and EGD with possible dil, pt aware of appt.

## 2014-02-10 HISTORY — PX: ESOPHAGOGASTRODUODENOSCOPY: SHX1529

## 2014-02-14 ENCOUNTER — Ambulatory Visit: Payer: Medicare Other | Admitting: *Deleted

## 2014-02-14 VITALS — Ht 67.0 in | Wt 175.0 lb

## 2014-02-14 DIAGNOSIS — R1013 Epigastric pain: Secondary | ICD-10-CM

## 2014-02-14 NOTE — Progress Notes (Signed)
Pt changed his appt to 03-03-14 at 11:00.  New instructions re: times reviewed with pt  No egg or soy allergy  No home oxygen use or problems with anesthesia  No medications for weight loss taken

## 2014-02-26 ENCOUNTER — Encounter: Payer: Self-pay | Admitting: Gastroenterology

## 2014-03-03 ENCOUNTER — Encounter: Payer: Self-pay | Admitting: Gastroenterology

## 2014-03-03 ENCOUNTER — Ambulatory Visit (AMBULATORY_SURGERY_CENTER): Payer: Medicare Other | Admitting: Gastroenterology

## 2014-03-03 VITALS — BP 140/78 | HR 82 | Temp 97.2°F | Resp 14 | Ht 67.0 in | Wt 175.0 lb

## 2014-03-03 DIAGNOSIS — R1310 Dysphagia, unspecified: Secondary | ICD-10-CM

## 2014-03-03 DIAGNOSIS — R1013 Epigastric pain: Secondary | ICD-10-CM

## 2014-03-03 MED ORDER — SODIUM CHLORIDE 0.9 % IV SOLN: 500.0000 mL | INTRAVENOUS | Status: DC

## 2014-03-03 NOTE — Patient Instructions (Signed)
Discharge instructions given with verbal understanding. Handout on a dilatation diet. Resume previous medications. YOU HAD AN ENDOSCOPIC PROCEDURE TODAY AT [facility]: Refer to the procedure report that was given to you for any specific questions about what was found during the examination.  If the procedure report does not answer your questions, please call your gastroenterologist to clarify.  If you requested that your care partner not be given the details of your procedure findings, then the procedure report has been included in a sealed envelope for you to review at your convenience later.  YOU SHOULD EXPECT: Some feelings of bloating in the abdomen. Passage of more gas than usual.  Walking can help get rid of the air that was put into your GI tract during the procedure and reduce the bloating. If you had a lower endoscopy (such as a colonoscopy or flexible sigmoidoscopy) you may notice spotting of blood in your stool or on the toilet paper. If you underwent a bowel prep for your procedure, then you may not have a normal bowel movement for a few days.  DIET: Your first meal following the procedure should be a light meal and then it is ok to progress to your normal diet.  A half-sandwich or bowl of soup is an example of a good first meal.  Heavy or fried foods are harder to digest and may make you feel nauseous or bloated.  Likewise meals heavy in dairy and vegetables can cause extra gas to form and this can also increase the bloating.  Drink plenty of fluids but you should avoid alcoholic beverages for 24 hours.  ACTIVITY: Your care partner should take you home directly after the procedure.  You should plan to take it easy, moving slowly for the rest of the day.  You can resume normal activity the day after the procedure however you should NOT DRIVE or use heavy machinery for 24 hours (because of the sedation medicines used during the test).    SYMPTOMS TO REPORT IMMEDIATELY: A  gastroenterologist can be reached at any hour.  During normal business hours, 8:30 AM to 5:00 PM Monday through Friday, call [phone].  After hours and on weekends, please call the GI answering service at [phone] who will take a message and have the physician on call contact you.   Following upper endoscopy (EGD)  Vomiting of blood or coffee ground material  New chest pain or pain under the shoulder blades  Painful or persistently difficult swallowing  New shortness of breath  Fever of 100F or higher  Black, tarry-looking stools  FOLLOW UP: If any biopsies were taken you will be contacted by phone or by letter within the next 1-3 weeks.  Call your gastroenterologist if you have not heard about the biopsies in 3 weeks.  Our staff will call the home number listed on your records the next business day following your procedure to check on you and address any questions or concerns that you may have at that time regarding the information given to you following your procedure. This is a courtesy call and so if there is no answer at the home number and we have not heard from you through the emergency physician on call, we will assume that you have returned to your regular daily activities without incident.  SIGNATURES/CONFIDENTIALITY: You and/or your care partner have signed paperwork which will be entered into your electronic medical record.  These signatures attest to the fact that that the information above  on your After Visit Summary has been reviewed and is understood.  Full responsibility of the confidentiality of this discharge information lies with you and/or your care-partner.

## 2014-03-03 NOTE — Op Note (Addendum)
[facility]  [address] [place], [address]   ENDOSCOPY PROCEDURE REPORT  PATIENT: Edward Chen, Edward Chen  MR#: [id number] BIRTHDATE: Jan 15, 1961 , 77  yrs. old GENDER: Male ENDOSCOPIST: Inda Castle, MD REFERRED BY:  Ria Bush, M.D. PROCEDURE DATE:  03/03/2014 PROCEDURE:  EGD, diagnostic and Maloney dilation of esophagus ASA CLASS:     Class II INDICATIONS:  Chest pain.   Epigastric pain.  Dysphagia. MEDICATIONS: These medications were titrated to patient response per physician's verbal order, propofol (Diprivan) 200mg  IV, and Simethicone 0.6cc PO TOPICAL ANESTHETIC:  DESCRIPTION OF PROCEDURE: After the risks benefits and alternatives of the procedure were thoroughly explained, informed consent was obtained.  The [id number] endoscope was introduced through the mouth and advanced to the third portion of the duodenum. Without limitations.  The instrument was slowly withdrawn as the mucosa was fully examined.      Retained gastric contents was noted.  Thoroughly to sessile 2 mm polyps in the gastric antrum with normal overlying mucosa.   The remainder of the upper endoscopy exam was otherwise normal. Retroflexed views revealed no abnormalities.     The scope was then withdrawn from the patient and the procedure completed.  A #54 Isabell Jarvis dilator was passed with minimal resistance.  There was no heme.  COMPLICATIONS: There were no complications. ENDOSCOPIC IMPRESSION: 1.  gastric polyps 2.  retained gastric contents 3.  status post Maloney dilation for odynophagia/dysphagia  Patient has stopped Celexa on his own.  If symptoms do not improve postdilatation will restart Reglan  RECOMMENDATIONS:  REPEAT EXAM:  eSigned:  Inda Castle, MD 03/03/2014 11:32 AM Revised: 03/03/2014 11:32 AM  CC:

## 2014-03-03 NOTE — Progress Notes (Signed)
Procedure ends, to recovery, report given and VSS. 

## 2014-03-03 NOTE — Progress Notes (Signed)
Called to room to assist during endoscopic procedure.  Patient ID and intended procedure confirmed with present staff. Received instructions for my participation in the procedure from the performing physician.  

## 2014-03-04 ENCOUNTER — Telehealth: Payer: Self-pay | Admitting: *Deleted

## 2014-03-04 NOTE — Telephone Encounter (Signed)
  Follow up Call-  Call back number 03/03/2014 11/29/2012  Post procedure Call Back phone  # [phone]  Permission to leave phone message Yes Yes     Patient questions:  Do you have a fever, pain , or abdominal swelling? no Pain Score  0 *  Have you tolerated food without any problems? yes  Have you been able to return to your normal activities? yes  Do you have any questions about your discharge instructions: Diet   no Medications  no Follow up visit  no  Do you have questions or concerns about your Care? no  Actions: * If pain score is 4 or above: No action needed, pain <4.

## 2014-03-05 ENCOUNTER — Encounter: Payer: Medicare Other | Admitting: Gastroenterology

## 2014-03-10 ENCOUNTER — Telehealth: Payer: Self-pay | Admitting: Gastroenterology

## 2014-03-11 NOTE — Telephone Encounter (Signed)
Edward Chen pt states he is still having problems with epigastric pain and abdominal pain. Per EGD report pt was to call in 1 week if still having symptoms and pt would be started on Reglan. Dr. Ardis Hughs as doc of the day please advise regarding Reglan.

## 2014-03-11 NOTE — Telephone Encounter (Signed)
Left message for pt to call back  °

## 2014-03-12 ENCOUNTER — Other Ambulatory Visit: Payer: Self-pay | Admitting: Pulmonary Disease

## 2014-03-12 MED ORDER — METOCLOPRAMIDE HCL 10 MG PO TABS: 10.0000 mg | ORAL_TABLET | Freq: Three times a day (TID) | ORAL | Status: DC

## 2014-03-12 NOTE — Telephone Encounter (Signed)
Per Alonza Bogus PA pt may start reglan 10mg  before meals and at bedtime. Pt scheduled for OV with Amy Esterwood PA 04/11/14@10am . Pt aware of appt.

## 2014-03-17 ENCOUNTER — Encounter: Payer: Self-pay | Admitting: Family Medicine

## 2014-04-11 ENCOUNTER — Ambulatory Visit: Payer: Medicare Other | Admitting: Gastroenterology

## 2014-05-13 ENCOUNTER — Other Ambulatory Visit: Payer: Self-pay | Admitting: Pulmonary Disease

## 2014-05-14 ENCOUNTER — Other Ambulatory Visit: Payer: Self-pay | Admitting: Family Medicine

## 2014-05-14 NOTE — Telephone Encounter (Signed)
Ok to refill 

## 2014-05-15 NOTE — Telephone Encounter (Signed)
plz phone in. 

## 2014-05-15 NOTE — Telephone Encounter (Signed)
Rx called in as directed.   

## 2014-07-11 ENCOUNTER — Other Ambulatory Visit: Payer: Self-pay

## 2014-07-11 MED ORDER — FENOFIBRATE 160 MG PO TABS: ORAL_TABLET | ORAL | Status: DC

## 2014-07-11 MED ORDER — LOSARTAN POTASSIUM 100 MG PO TABS: ORAL_TABLET | ORAL | Status: DC

## 2014-07-11 NOTE — Telephone Encounter (Signed)
Pt request refill fenofibrate and losartan to [facility] [address]. Pt out of med; advised done and Amber is scheduling pt fasting lab appt prior to wellness exam.

## 2014-07-31 ENCOUNTER — Telehealth: Payer: Self-pay | Admitting: Gastroenterology

## 2014-07-31 NOTE — Telephone Encounter (Signed)
Patient is taking Reglan before his meals. Reports he has pain in the center of his chest after meals despite how small the meal is. This has been going on for month or more. He has burning and belching. Appointment made for evaluation.

## 2014-08-05 ENCOUNTER — Encounter: Payer: Self-pay | Admitting: Physician Assistant

## 2014-08-05 ENCOUNTER — Ambulatory Visit (INDEPENDENT_AMBULATORY_CARE_PROVIDER_SITE_OTHER): Payer: Medicare Other | Admitting: Physician Assistant

## 2014-08-05 VITALS — BP 136/94 | HR 96 | Ht 67.0 in | Wt 186.2 lb

## 2014-08-05 DIAGNOSIS — R140 Abdominal distension (gaseous): Secondary | ICD-10-CM

## 2014-08-05 DIAGNOSIS — R1013 Epigastric pain: Secondary | ICD-10-CM

## 2014-08-05 DIAGNOSIS — R079 Chest pain, unspecified: Secondary | ICD-10-CM

## 2014-08-05 DIAGNOSIS — K219 Gastro-esophageal reflux disease without esophagitis: Secondary | ICD-10-CM

## 2014-08-05 MED ORDER — ESOMEPRAZOLE MAGNESIUM 40 MG PO CPDR: 40.0000 mg | DELAYED_RELEASE_CAPSULE | Freq: Two times a day (BID) | ORAL | Status: DC

## 2014-08-05 NOTE — Patient Instructions (Addendum)
Stop Metoclopramide. Stop the Prilosec. Start Nexium 40 mg twice daily. [facility] [address]. We scheduled the Abdominal Ultrasound at 8:30 am. Arrive at 8:15 am. Have nothing by mouth after midnight. We scheduled the Gastric Emptying Scan at 9:30  Am .  Do not take the Nexium 40 mg that morning.

## 2014-08-05 NOTE — Progress Notes (Signed)
Patient ID: NOTNAMED Edward Chen, male   DOB: 29-Mar-1961, 53 y.o.   MRN: [id number]   Subjective:    Patient ID: Edward Chen, male    DOB: September 04, 1961, 53 y.o.   MRN: [id number]  HPI Edward Chen is a 53 year old white male known to Dr. Deatra Ina. He has diagnosis of chronic GERD and gastroparesis. Also with history of a chronic pain syndrome requiring chronic narcotics for back pain. He also has significant issues with anxiety and panic. He has hypertension, hyperlipidemia and chronic intermittent chest pain which is felt in the past to be noncardiac. Patient had upper endoscopy done in June 2015 was felt to have retained gastric contents there was no stricture but was Venia Minks dilated due to complaints of dysphagia. At that time his symptoms were felt likely secondary to gastroparesis he was put on a gastroparesis diet. He had been on Celexa and therefore could not take metoclopramide. The patient stopped the Celexa and then was placed on Reglan 10 mg 4 times daily. He comes in today stating that the Reglan has not made any difference and he continues to have problems with abdominal pain postprandially over the past several months. He describes a full bloated sensation in his upper abdomen and frequent regurgitation. Says he feels up quickly but if he eats too much she'll have upper abdominal discomfort. He has gained about 10 pounds, has been eating out frequently and feels that this is worsened his symptoms. He also describes ongoing trouble with intermittent chest pain which he feels is precipitated by anxiety. He says if he has to hurry or thinks about something too much she will bring on the chest pain. Pain does not appear to be exertional or related to meals. Last gastric emptying scan was done in 2005 and did show any 22% retention at 2 hours.  Review of Systems Pertinent positive and negative review of systems were noted in the above HPI section.  All other review of systems was otherwise negative.  Outpatient  Encounter Prescriptions as of 08/05/2014  Medication Sig  . ALPRAZolam (XANAX) 0.5 MG tablet TAKE 1 TABLET 3 TIMES DAILY AS NEEDED  . baclofen (LIORESAL) 10 MG tablet Take 10 mg by mouth as needed for muscle spasms.  Marland Kitchen buPROPion (WELLBUTRIN) 100 MG tablet Take 100 mg by mouth 2 (two) times daily.  . fenofibrate 160 MG tablet TAKE 1 TABLET BY MOUTH EVERY DAY  . losartan (COZAAR) 100 MG tablet TAKE 1 TABLET BY MOUTH EVERY DAY  . metoCLOPramide (REGLAN) 10 MG tablet Take 1 tablet (10 mg total) by mouth 4 (four) times daily -  before meals and at bedtime.  Marland Kitchen omeprazole (PRILOSEC OTC) 20 MG tablet Take 20 mg by mouth daily.    Marland Kitchen oxyCODONE-acetaminophen (PERCOCET) 7.5-325 MG per tablet Take as directed by pain management   . esomeprazole (NEXIUM) 40 MG capsule Take 1 capsule (40 mg total) by mouth 2 (two) times daily before a meal.  . [DISCONTINUED] Hyoscyamine Sulfate 0.375 MG TBCR Take one tab twice a day for 5 days, abdominal pain, then as needed   Allergies  Allergen Reactions  . Other Anaphylaxis    BLACKBERRIES  . Peanuts [Peanut Oil] Anaphylaxis  . Shellfish Allergy Anaphylaxis   Patient Active Problem List   Diagnosis Date Noted  . Abdominal pain, epigastric 01/21/2014  . ALLERGY 07/07/2010  . Chronic pain syndrome 01/11/2009  . Gastroparesis 01/11/2009  . DEGENERATIVE JOINT DISEASE 01/11/2009  . HYPERTENSION, BENIGN 02/18/2008  . HYPERLIPIDEMIA 01/03/2008  .  ANXIETY 01/03/2008  . ASTHMATIC BRONCHITIS, ACUTE 01/03/2008  . GERD 01/03/2008  . PALPITATIONS, HX OF 01/03/2008  . CHEST PAIN, ATYPICAL, HX OF 01/03/2008   History   Social History  . Marital Status: Married    Spouse Name: Colbe Viviano    Number of Children: N/A  . Years of Education: N/A   Occupational History  . retired on disability    Social History Main Topics  . Smoking status: Former Smoker -- 20 years    Types: Cigarettes    Quit date: 09/13/2007  . Smokeless tobacco: Never Used     Comment: OCC  USES VAPOR CIGARETTE  . Alcohol Use: 7.2 oz/week    12 Cans of beer per week     Comment: DRINKS ON WEEKENDS  . Drug Use: No  . Sexual Activity: Not on file   Other Topics Concern  . Not on file   Social History Narrative    Edward Chen's family history includes Lung disease in his father. There is no history of Cancer, CAD, Stroke, Diabetes, Colon cancer, Esophageal cancer, Stomach cancer, or Rectal cancer.      Objective:    Filed Vitals:   08/05/14 1352  BP: 136/94  Pulse: 96    Physical Exam  well-developed anxious appearing white male in no acute distress height 5 foot 7 weight 186. HEENT ;nontraumatic normocephalic EOMI PERRLA sclera anicteric, Supple ;no JVD, Cardiovascular; regular rate and rhythm with S1-S2 no murmur rub or gallop no tenderness to palpation of the chest wall, Abdomen; soft and basically nontender there is no palpable mass or hepatosplenomegaly bowel sounds are present, Pulmonary clear bilaterally, Rectal ;exam not done, Extremities; no clubbing cyanosis or edema skin warm and dry, Psych ;mood and affect appropriate       Assessment & Plan:   #54 53 year old male with chronic GERD, and probable gastroparesis currently unresponsive to metoclopramide 10 mg 4 times daily. We'll rule out gastroparesis versus functional dyspepsia versus poorly controlled GERD. #2 Long history of intermittent chest pain, felt secondary to anxiety/panic. Cardiac workup was several years ago #3 chronic anxiety #4 chronic narcotic use  Plan; patient is encouraged to decrease Percocet use as much as possible and it was explained to him that this is definitely affecting his stomach function Change PPI to Nexium 40 mg by mouth twice daily Stop metoclopramide Schedule for gastric emptying scan to try to document current gastroparesis Schedule for upper abdominal ultrasound Antireflux diet Follow-up with Dr. Deatra Ina in 4-5 weeks.     Ehan Freas Genia Harold PA-C 08/05/2014

## 2014-08-11 ENCOUNTER — Telehealth: Payer: Self-pay | Admitting: Gastroenterology

## 2014-08-11 NOTE — Telephone Encounter (Signed)
Patient is not worse. He is frustrated. He has pain after eating. No vomiting or BM changes. He will try full liquids and no fat for 24 hrs. to see if that helps. I checked with scheduling and there are no earlier spots in radiology. Patient is okay with this. Any other suggestions?

## 2014-08-11 NOTE — Progress Notes (Signed)
Reviewed and agree with management. Robert D. Kaplan, M.D., FACG  

## 2014-08-14 NOTE — Telephone Encounter (Signed)
No, need to wait for GE scan , and [place] results

## 2014-08-15 NOTE — Telephone Encounter (Signed)
Still no earlier appointments

## 2014-08-21 ENCOUNTER — Ambulatory Visit (HOSPITAL_COMMUNITY)
Admission: RE | Admit: 2014-08-21 | Discharge: 2014-08-21 | Disposition: A | Discharge status: Home / Self Care | Payer: Medicare Other | Source: Ambulatory Visit | Attending: Physician Assistant | Admitting: Physician Assistant

## 2014-08-21 DIAGNOSIS — R140 Abdominal distension (gaseous): Secondary | ICD-10-CM

## 2014-08-21 DIAGNOSIS — R079 Chest pain, unspecified: Secondary | ICD-10-CM

## 2014-08-21 DIAGNOSIS — K219 Gastro-esophageal reflux disease without esophagitis: Secondary | ICD-10-CM

## 2014-08-21 DIAGNOSIS — R1013 Epigastric pain: Secondary | ICD-10-CM | POA: Diagnosis present

## 2014-08-21 DIAGNOSIS — K760 Fatty (change of) liver, not elsewhere classified: Secondary | ICD-10-CM | POA: Diagnosis not present

## 2014-08-21 MED ORDER — TECHNETIUM TC 99M SULFUR COLLOID
2.2000 | Freq: Once | INTRAVENOUS | Status: AC | PRN
  Administered 2014-08-21: 2.2 via INTRAVENOUS

## 2014-08-22 ENCOUNTER — Telehealth: Payer: Self-pay | Admitting: Gastroenterology

## 2014-08-22 NOTE — Telephone Encounter (Signed)
Amy please tell me what to tell this man on the result note. He is the one that felt he would not last until his test date. Thanks

## 2014-08-31 ENCOUNTER — Other Ambulatory Visit: Payer: Self-pay | Admitting: Family Medicine

## 2014-08-31 DIAGNOSIS — I10 Essential (primary) hypertension: Secondary | ICD-10-CM

## 2014-08-31 DIAGNOSIS — Z125 Encounter for screening for malignant neoplasm of prostate: Secondary | ICD-10-CM

## 2014-08-31 DIAGNOSIS — E785 Hyperlipidemia, unspecified: Secondary | ICD-10-CM

## 2014-09-02 ENCOUNTER — Encounter: Payer: Self-pay | Admitting: Radiology

## 2014-09-02 ENCOUNTER — Other Ambulatory Visit (INDEPENDENT_AMBULATORY_CARE_PROVIDER_SITE_OTHER): Payer: Medicare Other

## 2014-09-02 DIAGNOSIS — E785 Hyperlipidemia, unspecified: Secondary | ICD-10-CM

## 2014-09-02 DIAGNOSIS — Z125 Encounter for screening for malignant neoplasm of prostate: Secondary | ICD-10-CM

## 2014-09-02 LAB — LIPID PANEL
CHOLESTEROL: 194 mg/dL (ref 0–200)
HDL: 21.8 mg/dL — AB (ref >39.00)
NonHDL: 172.2
Total CHOL/HDL Ratio: 9
VLDL: 134.2 mg/dL — ABNORMAL HIGH (ref 0.0–40.0)

## 2014-09-02 LAB — PSA: PSA: 4.71 ng/mL — ABNORMAL HIGH (ref 0.10–4.00)

## 2014-09-02 LAB — COMPREHENSIVE METABOLIC PANEL
ALT: 60 U/L — AB (ref 0–53)
AST: 35 U/L (ref 0–37)
Albumin: 4.3 g/dL (ref 3.5–5.2)
Alkaline Phosphatase: 62 U/L (ref 39–117)
BUN: 10 mg/dL (ref 6–23)
CALCIUM: 9.7 mg/dL (ref 8.4–10.5)
CO2: 26 meq/L (ref 19–32)
CREATININE: 1 mg/dL (ref 0.4–1.5)
Chloride: 104 mEq/L (ref 96–112)
GFR: 83.23 mL/min (ref >60.00)
GLUCOSE: 120 mg/dL — AB (ref 70–99)
Potassium: 4.4 mEq/L (ref 3.5–5.1)
Sodium: 140 mEq/L (ref 135–145)
TOTAL PROTEIN: 7 g/dL (ref 6.0–8.3)
Total Bilirubin: 0.6 mg/dL (ref 0.2–1.2)

## 2014-09-02 LAB — LDL CHOLESTEROL, DIRECT: Direct LDL: 86 mg/dL

## 2014-09-09 ENCOUNTER — Encounter: Payer: Medicare Other | Admitting: Family Medicine

## 2014-09-12 DIAGNOSIS — R892 Abnormal level of other drugs, medicaments and biological substances in specimens from other organs, systems and tissues: Secondary | ICD-10-CM

## 2014-09-12 HISTORY — DX: Abnormal level of other drugs, medicaments and biological substances in specimens from other organs, systems and tissues: R89.2

## 2014-09-15 ENCOUNTER — Ambulatory Visit (INDEPENDENT_AMBULATORY_CARE_PROVIDER_SITE_OTHER): Payer: Medicare Other | Admitting: Family Medicine

## 2014-09-15 ENCOUNTER — Encounter: Payer: Self-pay | Admitting: Family Medicine

## 2014-09-15 VITALS — BP 122/80 | HR 96 | Temp 98.1°F | Ht 67.0 in | Wt 179.5 lb

## 2014-09-15 DIAGNOSIS — R972 Elevated prostate specific antigen [PSA]: Secondary | ICD-10-CM | POA: Insufficient documentation

## 2014-09-15 DIAGNOSIS — R739 Hyperglycemia, unspecified: Secondary | ICD-10-CM

## 2014-09-15 DIAGNOSIS — Z79899 Other long term (current) drug therapy: Secondary | ICD-10-CM | POA: Diagnosis not present

## 2014-09-15 DIAGNOSIS — E785 Hyperlipidemia, unspecified: Secondary | ICD-10-CM

## 2014-09-15 DIAGNOSIS — F411 Generalized anxiety disorder: Secondary | ICD-10-CM

## 2014-09-15 DIAGNOSIS — G894 Chronic pain syndrome: Secondary | ICD-10-CM

## 2014-09-15 DIAGNOSIS — Z7189 Other specified counseling: Secondary | ICD-10-CM | POA: Insufficient documentation

## 2014-09-15 DIAGNOSIS — Z0000 Encounter for general adult medical examination without abnormal findings: Secondary | ICD-10-CM | POA: Insufficient documentation

## 2014-09-15 DIAGNOSIS — I10 Essential (primary) hypertension: Secondary | ICD-10-CM

## 2014-09-15 NOTE — Assessment & Plan Note (Signed)
Normal range over last several years, then this year PSA increased from 0.53 to 4.7 - discussed this. Exam today reassuring, no significant enlargement. Will recheck level later this week and if persistently elevated will refer to urology for further evaluation. If decreased, will recommend rpt level in 6 mo. Pt agrees with plan.

## 2014-09-15 NOTE — Assessment & Plan Note (Signed)
Chronic. On fenofibrate 160mg  daily. Reviewed FLP from this week - marked deterioration of triglycerides - as well as low HDL. Will have him return this week to recheck FLP. If persistently elevated will recommend addition of statin or fish oil.  Pt declines increased fish in diet.

## 2014-09-15 NOTE — Assessment & Plan Note (Addendum)
On wellbutrin and xanax - now off celexa. Persistent anxiety troubles, but pt has not been regular with wellbutrin use - will strive for more regular use and then reassess mood control.

## 2014-09-15 NOTE — Assessment & Plan Note (Signed)
Preventative protocols reviewed and updated unless pt declined. Discussed healthy diet and lifestyle.  

## 2014-09-15 NOTE — Patient Instructions (Addendum)
Return at your convenience fasting for labs to recheck triglycerides and prostate (sometime this week) Take wellbutrin regularly twice daily for next month then update me with effect. We will continue to monitor liver and sugar.  We will call you with results of labs when they return.

## 2014-09-15 NOTE — Assessment & Plan Note (Signed)
Continue f/u with [facility].

## 2014-09-15 NOTE — Progress Notes (Signed)
Pre visit review using our clinic review tool, if applicable. No additional management support is needed unless otherwise documented below in the visit note. 

## 2014-09-15 NOTE — Assessment & Plan Note (Signed)
Chronic, stable. Continue med. 

## 2014-09-15 NOTE — Progress Notes (Signed)
BP 122/80 mmHg  Pulse 96  Temp(Src) 98.1 F (36.7 C) (Oral)  Ht 5\' 7"  (1.702 m)  Wt 179 lb 8 oz (81.421 kg)  BMI 28.11 kg/m2   CC: medicare wellness visit Subjective:    Patient ID: Edward Chen, male    DOB: 10/24/1960, 54 y.o.   MRN: [id number]  HPI: Edward Chen is a 54 y.o. male presenting on 09/15/2014 for Annual Exam   Trouble with delayed stomach emptying. Had emptying study, found to have gastroparesis. Reglan didn't help. Planning on seeing Dr Deatra Ina on Wednesday for f/u.   Hearing screen passed today Vision screen passed 1 fall in last year - a few weeks ago while walking at home stepped on dog's toy. PHQ9 = 14. On wellbutrin 100mg  bid for last 6 months. But not regular with use. celexa caused polyuria. Doesn't remember effect of zoloft.   Preventative: COLONOSCOPY Date: 11/2012 1 TA, mod diverticulosis, rpt 5 yrs Deatra Ina) Prostate cancer screening - has had tested normal in the past.  Flu shot 2005, declines today Pneumovax 2011 Tetanus per patient 2013 Advanced directives - discussed, declines further discussion at this times  On disability for chronic lower back pain followed by pain clinic  Relevant past medical, surgical, family and social history reviewed and updated as indicated. Interim medical history since our last visit reviewed. Allergies and medications reviewed and updated.  Current Outpatient Prescriptions on File Prior to Visit  Medication Sig  . ALPRAZolam (XANAX) 0.5 MG tablet TAKE 1 TABLET 3 TIMES DAILY AS NEEDED  . baclofen (LIORESAL) 10 MG tablet Take 10 mg by mouth as needed for muscle spasms.  Marland Kitchen buPROPion (WELLBUTRIN) 100 MG tablet Take 100 mg by mouth 2 (two) times daily.  Marland Kitchen esomeprazole (NEXIUM) 40 MG capsule Take 1 capsule (40 mg total) by mouth 2 (two) times daily before a meal.  . fenofibrate 160 MG tablet TAKE 1 TABLET BY MOUTH EVERY DAY  . losartan (COZAAR) 100 MG tablet TAKE 1 TABLET BY MOUTH EVERY DAY  . oxyCODONE-acetaminophen  (PERCOCET) 7.5-325 MG per tablet Take as directed by pain management    No current facility-administered medications on file prior to visit.    Review of Systems  Constitutional: Negative for fever, chills, activity change, appetite change, fatigue and unexpected weight change.  HENT: Negative for hearing loss.   Eyes: Positive for visual disturbance (endorses due to chronic blepharitis).  Respiratory: Negative for cough, chest tightness, shortness of breath and wheezing.   Cardiovascular: Positive for chest pain (thought GI and anxiety related). Negative for palpitations and leg swelling.  Gastrointestinal: Positive for abdominal pain. Negative for nausea, vomiting, diarrhea, constipation, blood in stool and abdominal distention.  Genitourinary: Negative for hematuria and difficulty urinating.  Musculoskeletal: Negative for myalgias, arthralgias and neck pain.  Skin: Negative for rash.  Neurological: Negative for dizziness, seizures, syncope and headaches.  Hematological: Negative for adenopathy. Does not bruise/bleed easily.  Psychiatric/Behavioral: Positive for dysphoric mood. The patient is nervous/anxious.    Per HPI unless specifically indicated above     Objective:    BP 122/80 mmHg  Pulse 96  Temp(Src) 98.1 F (36.7 C) (Oral)  Ht 5\' 7"  (1.702 m)  Wt 179 lb 8 oz (81.421 kg)  BMI 28.11 kg/m2  Wt Readings from Last 3 Encounters:  09/15/14 179 lb 8 oz (81.421 kg)  08/05/14 186 lb 4 oz (84.482 kg)  03/03/14 175 lb (79.379 kg)    Physical Exam  Constitutional: He is oriented to  person, place, and time. He appears well-developed and well-nourished. No distress.  HENT:  Head: Normocephalic and atraumatic.  Right Ear: Hearing, tympanic membrane, external ear and ear canal normal.  Left Ear: Hearing, tympanic membrane, external ear and ear canal normal.  Nose: Nose normal.  Mouth/Throat: Uvula is midline, oropharynx is clear and moist and mucous membranes are normal. No  oropharyngeal exudate, posterior oropharyngeal edema or posterior oropharyngeal erythema.  Eyes: Conjunctivae and EOM are normal. Pupils are equal, round, and reactive to light. No scleral icterus.  Neck: Normal range of motion. Neck supple.  Cardiovascular: Normal rate, regular rhythm, normal heart sounds and intact distal pulses.   No murmur heard. Pulses:      Radial pulses are 2+ on the right side, and 2+ on the left side.  Pulmonary/Chest: Effort normal and breath sounds normal. No respiratory distress. He has no wheezes. He has no rales.  Abdominal: Soft. Bowel sounds are normal. He exhibits no distension and no mass. There is no tenderness. There is no rebound and no guarding.  Genitourinary: Rectum normal and prostate normal. Rectal exam shows no external hemorrhoid, no internal hemorrhoid, no fissure, no mass, no tenderness and anal tone normal. Prostate is not enlarged (15-20gm) and not tender.  Musculoskeletal: Normal range of motion. He exhibits no edema.  Lymphadenopathy:    He has no cervical adenopathy.  Neurological: He is alert and oriented to person, place, and time.  CN grossly intact, station and gait intact  Skin: Skin is warm and dry. No rash noted.  Psychiatric: He has a normal mood and affect. His behavior is normal. Judgment and thought content normal.  Nursing note and vitals reviewed.  Results for orders placed or performed in visit on 09/02/14  Lipid panel  Result Value Ref Range   Cholesterol 194 0 - 200 mg/dL   Triglycerides (H) 0.0 - 149.0 mg/dL    671.0 Triglyceride is over 400; calculations on Lipids are invalid.   HDL 21.80 (L) >39.00 mg/dL   VLDL 134.2 (H) 0.0 - 40.0 mg/dL   Total CHOL/HDL Ratio 9    NonHDL 172.20   Comprehensive metabolic panel  Result Value Ref Range   Sodium 140 135 - 145 mEq/L   Potassium 4.4 3.5 - 5.1 mEq/L   Chloride 104 96 - 112 mEq/L   CO2 26 19 - 32 mEq/L   Glucose, Bld 120 (H) 70 - 99 mg/dL   BUN 10 6 - 23 mg/dL    Creatinine, Ser 1.0 0.4 - 1.5 mg/dL   Total Bilirubin 0.6 0.2 - 1.2 mg/dL   Alkaline Phosphatase 62 39 - 117 U/L   AST 35 0 - 37 U/L   ALT 60 (H) 0 - 53 U/L   Total Protein 7.0 6.0 - 8.3 g/dL   Albumin 4.3 3.5 - 5.2 g/dL   Calcium 9.7 8.4 - 10.5 mg/dL   GFR 83.23 >60.00 mL/min  PSA  Result Value Ref Range   PSA 4.71 (H) 0.10 - 4.00 ng/mL  LDL cholesterol, direct  Result Value Ref Range   Direct LDL 86.0 mg/dL      Assessment & Plan:   Problem List Items Addressed This Visit    Medicare annual wellness visit, initial - Primary    I have personally reviewed the Medicare Annual Wellness questionnaire and have noted 1. The patient's medical and social history 2. Their use of alcohol, tobacco or illicit drugs 3. Their current medications and supplements 4. The patient's functional ability including ADL's,  fall risks, home safety risks and hearing or visual impairment. 5. Diet and physical activity 6. Evidence for depression or mood disorders The patients weight, height, BMI have been recorded in the chart.  Hearing and vision has been addressed. I have made referrals, counseling and provided education to the patient based review of the above and I have provided the pt with a written personalized care plan for preventive services. Provider list updated - see scanned questionairre. Reviewed preventative protocols and updated unless pt declined.    HYPERTENSION, BENIGN    Chronic, stable. Continue med.    Health maintenance examination    Preventative protocols reviewed and updated unless pt declined. Discussed healthy diet and lifestyle.     Generalized anxiety disorder    On wellbutrin and xanax - now off celexa. Persistent anxiety troubles, but pt has not been regular with wellbutrin use - will strive for more regular use and then reassess mood control.    Elevated PSA    Normal range over last several years, then this year PSA increased from 0.53 to 4.7 - discussed  this. Exam today reassuring, no significant enlargement. Will recheck level later this week and if persistently elevated will refer to urology for further evaluation. If decreased, will recommend rpt level in 6 mo. Pt agrees with plan.    Relevant Orders      PSA Total (Reflex To Free)   Dyslipidemia    Chronic. On fenofibrate 160mg  daily. Reviewed FLP from this week - marked deterioration of triglycerides - as well as low HDL. Will have him return this week to recheck FLP. If persistently elevated will recommend addition of statin or fish oil.  Pt declines increased fish in diet.    Relevant Orders      Lipid panel   Chronic pain syndrome    Continue f/u with [facility] pain management.    Advanced care planning/counseling discussion    Advanced directives - discussed, declines further discussion at this times     Other Visit Diagnoses    Hyperglycemia        Relevant Orders       Hemoglobin A1c        Follow up plan: Return as needed.

## 2014-09-15 NOTE — Assessment & Plan Note (Signed)

## 2014-09-15 NOTE — Assessment & Plan Note (Signed)
Advanced directives - discussed, declines further discussion at this times

## 2014-09-16 ENCOUNTER — Telehealth: Payer: Self-pay | Admitting: Family Medicine

## 2014-09-16 NOTE — Telephone Encounter (Signed)
Patient said he is having burning when he urinates along with frequency. Lab appt scheduled.

## 2014-09-16 NOTE — Telephone Encounter (Signed)
What symptoms is he having?  Let's have him come in for urinalysis to see if any evidence of infection. Ok to check labs this week regardless

## 2014-09-16 NOTE — Telephone Encounter (Signed)
Pt says he was in to see you yesterday and says he got so upset over his test results he forgot to mention that he thinks he has a UTI.  He wants to know if you want him to take antibiotic prior to his lab work? Will he need to come in first? Please advise. Pt requests a c/b. Thank you.

## 2014-09-16 NOTE — Telephone Encounter (Signed)
emmi mailed  °

## 2014-09-17 ENCOUNTER — Telehealth: Payer: Self-pay | Admitting: Gastroenterology

## 2014-09-17 ENCOUNTER — Other Ambulatory Visit (INDEPENDENT_AMBULATORY_CARE_PROVIDER_SITE_OTHER): Payer: Medicare Other

## 2014-09-17 ENCOUNTER — Encounter: Payer: Self-pay | Admitting: Gastroenterology

## 2014-09-17 ENCOUNTER — Ambulatory Visit (INDEPENDENT_AMBULATORY_CARE_PROVIDER_SITE_OTHER): Payer: Medicare Other | Admitting: Gastroenterology

## 2014-09-17 VITALS — BP 128/80 | HR 80 | Ht 67.0 in | Wt 176.6 lb

## 2014-09-17 DIAGNOSIS — R350 Frequency of micturition: Secondary | ICD-10-CM

## 2014-09-17 DIAGNOSIS — G8929 Other chronic pain: Secondary | ICD-10-CM | POA: Diagnosis not present

## 2014-09-17 DIAGNOSIS — E785 Hyperlipidemia, unspecified: Secondary | ICD-10-CM

## 2014-09-17 DIAGNOSIS — R739 Hyperglycemia, unspecified: Secondary | ICD-10-CM | POA: Diagnosis not present

## 2014-09-17 DIAGNOSIS — R1013 Epigastric pain: Secondary | ICD-10-CM

## 2014-09-17 DIAGNOSIS — K3184 Gastroparesis: Secondary | ICD-10-CM | POA: Diagnosis not present

## 2014-09-17 DIAGNOSIS — R972 Elevated prostate specific antigen [PSA]: Secondary | ICD-10-CM

## 2014-09-17 LAB — LIPID PANEL
Cholesterol: 178 mg/dL (ref 0–200)
HDL: 26.5 mg/dL — AB (ref >39.00)
NONHDL: 151.5
Total CHOL/HDL Ratio: 7
Triglycerides: 492 mg/dL — ABNORMAL HIGH (ref 0.0–149.0)
VLDL: 98.4 mg/dL — ABNORMAL HIGH (ref 0.0–40.0)

## 2014-09-17 LAB — HEMOGLOBIN A1C: HEMOGLOBIN A1C: 5.5 % (ref 4.6–6.5)

## 2014-09-17 LAB — POCT URINALYSIS DIPSTICK
BILIRUBIN UA: NEGATIVE
Blood, UA: NEGATIVE
Glucose, UA: NEGATIVE
Ketones, UA: NEGATIVE
Leukocytes, UA: NEGATIVE
NITRITE UA: NEGATIVE
PH UA: 6.5
PROTEIN UA: NEGATIVE
Spec Grav, UA: 1.025
UROBILINOGEN UA: 0.2

## 2014-09-17 LAB — LDL CHOLESTEROL, DIRECT: Direct LDL: 84.6 mg/dL

## 2014-09-17 MED ORDER — HYOSCYAMINE SULFATE ER 0.375 MG PO TBCR: EXTENDED_RELEASE_TABLET | ORAL | Status: DC

## 2014-09-17 NOTE — Assessment & Plan Note (Signed)
Not certain how much this is contributing to his symptoms.  While he has bloating, it is a less common causes for upper abdominal pain.  Recommendations #1 if symptoms do not improve with anti-cholinergics I will start him on domperidone

## 2014-09-17 NOTE — Addendum Note (Signed)
Addended by: Ria Bush on: 09/17/2014 12:30 PM   Modules accepted: Orders

## 2014-09-17 NOTE — Progress Notes (Signed)
      History of Present Illness:  Edward Chen has returned for evaluation of abdominal pain.  He complains of postprandial upper epigastric and lower chest discomfort.  He denies nausea.  He has his usual abdominal bloating.  Pain is especially severe if he eats large quantities.  Recent gastric emptying scan demonstrated delayed emptying.  Abdominal ultrasound was pertinent only for hepatic steatosis.  Lost about 3 pounds since his last visit.  He denies pyrosis.  He remains on Nexium.  Endoscopy approximate 7 months ago was unrevealing.  He was dilated and a Maloney dilator because of complaints of dysphagia.    Review of Systems: Pertinent positive and negative review of systems were noted in the above HPI section. All other review of systems were otherwise negative.    Current Medications, Allergies, Past Medical History, Past Surgical History, Family History and Social History were reviewed in [facility] record  Vital signs were reviewed in today's medical record. Physical Exam: General: Well developed , well nourished, no acute distress Skin: anicteric Head: Normocephalic and atraumatic Eyes:  sclerae anicteric, EOMI Ears: Normal auditory acuity Mouth: No deformity or lesions Lungs: Clear throughout to auscultation Heart: Regular rate and rhythm; no murmurs, rubs or bruits Abdomen: Soft, non tender and non distended. No masses, hepatosplenomegaly or hernias noted. Normal Bowel sounds.  There is no succussion splash Rectal:deferred Musculoskeletal: Symmetrical with no gross deformities  Pulses:  Normal pulses noted Extremities: No clubbing, cyanosis, edema or deformities noted Neurological: Alert oriented x 4, grossly nonfocal Psychological:  Alert and cooperative. Normal mood and affect  See Assessment and Plan under Problem List

## 2014-09-17 NOTE — Addendum Note (Signed)
Addended by: Royann Shivers A on: 09/17/2014 09:13 AM   Modules accepted: Orders

## 2014-09-17 NOTE — Telephone Encounter (Signed)
Patient wants to be sure ok to take Hyoscyamine with his pain medication. Pain medication was on his medication list at visit so MD is aware. Ok to take.

## 2014-09-17 NOTE — Patient Instructions (Signed)
Stop Taking Nexium Call in 1 week to report progress Your Follow up appointment is scheduled on 10/31/2014 at 9:15am

## 2014-09-17 NOTE — Assessment & Plan Note (Signed)
Postprandial abdominal pain exacerbated by eating large meals.  Etiology is unclear.  While he has gastroparesis this doesn't seem to be the source of his primary complaint.  PPI therapy certainly can cause pain.  He may have nonspecific spasm.  Recommendations #1 hold Nexium #2 trial of hyomax 0.375 mg twice a day #3 to consider Carafate if reflux symptoms worsened #4 to consider trial of domperidone if the above measures are unsuccessful

## 2014-09-18 ENCOUNTER — Other Ambulatory Visit: Payer: Self-pay | Admitting: Family Medicine

## 2014-09-18 ENCOUNTER — Telehealth: Payer: Self-pay

## 2014-09-18 DIAGNOSIS — E785 Hyperlipidemia, unspecified: Secondary | ICD-10-CM

## 2014-09-18 LAB — PSA TOTAL (REFLEX TO FREE): PSA: 0.4 ng/mL (ref 0.0–4.0)

## 2014-09-18 MED ORDER — FENOFIBRATE 160 MG PO TABS: ORAL_TABLET | ORAL | Status: DC

## 2014-09-18 MED ORDER — FISH OIL 1000 MG PO CAPS: 1.0000 | ORAL_CAPSULE | Freq: Every day | ORAL | Status: DC

## 2014-09-18 NOTE — Telephone Encounter (Signed)
See result note.  

## 2014-09-18 NOTE — Telephone Encounter (Signed)
Pt left v/m requesting cb at [phone] when 09/17/2014 lab results available.

## 2014-09-19 LAB — URINE CULTURE
COLONY COUNT: NO GROWTH
Organism ID, Bacteria: NO GROWTH

## 2014-09-24 ENCOUNTER — Telehealth: Payer: Self-pay | Admitting: Family Medicine

## 2014-09-24 NOTE — Telephone Encounter (Signed)
Pt called wanted a copy of his last labs sent to him

## 2014-09-25 ENCOUNTER — Telehealth: Payer: Self-pay | Admitting: Gastroenterology

## 2014-09-25 NOTE — Telephone Encounter (Signed)
Epigastric pain is better by "50 %" but he beginning to experience indigestion and reflux. He has taken TUMS with little relief. Reports normal bowel movement. Do you want him to try Carafate or resume Nexium at QD or BID. He also has Prilosec at home. Please advise. See his OV from 09/17/14.

## 2014-09-25 NOTE — Telephone Encounter (Signed)
Yes, let's begin Carafate.  He should call back in 3-4 days if he is not better

## 2014-09-25 NOTE — Telephone Encounter (Signed)
Mailed as requested.

## 2014-09-26 ENCOUNTER — Telehealth: Payer: Self-pay | Admitting: Family Medicine

## 2014-09-26 DIAGNOSIS — M545 Low back pain, unspecified: Secondary | ICD-10-CM

## 2014-09-26 NOTE — Telephone Encounter (Signed)
Patient Name: Edward Chen  DOB: 1961-07-11    Nurse Assessment  Nurse: Mechele Dawley, RN, Amy Date/Time Eilene Ghazi Time): 09/26/2014 4:06:41 PM  Confirm and document reason for call. If symptomatic, describe symptoms. ---CALLER STATES THAT HE TRIPPED ON A DOG TOY AND FELL ON CHRISTMAS. HAVING PAIN IN THE LOWER RIGHT SIDE OF THE BACK. HE STATES THAT HIS URINE HAS BEEN SLOWER SINCE THEN. NO ABNORMAL COLOR NOTED. NO FEVER. IF HE LAYS ON THIS SIDE HE HAS PAIN, IF NOT HE DOES NOT HAVE ANY PAIN.  Has the patient traveled out of the country within the last 30 days? ---Not Applicable  Does the patient require triage? ---Yes  Related visit to physician within the last 2 weeks? ---Yes  Does the PT have any chronic conditions? (i.e. diabetes, asthma, etc.) ---Yes  List chronic conditions. ---ANXIETY, PANIC ATTACKS, SCIATIC NERVE ISSUES, DDD,     Guidelines    Guideline Title Affirmed Question Affirmed Notes  Back Pain [1] MODERATE back pain (e.g., interferes with normal activities) AND [2] present > 3 days    Final Disposition User   See PCP When Office is Open (within 3 days) [place], Therapist, sports, Amy    Comments  CALLER STATES THAT HE HAS ALREADY DISCUSSED [place] MD TWICE. HE WANTS AN ORDER FOR AN XRAY. HE STATES THAT HE DOES NOT NEED AN APPT AT THE OFFICE - HE NEEDS AN ORDER FOR AN XRAY AT THE [facility]. INSTRUCTED HIM THAT HE WILL NEED TO CALL THE OFFICE ON MONDAY MORNING AND SEE IF THAT WOULD BE POSSIBLE. HE VERBALIZED UNDERSTANDING OF THIS INFORMATION AND CARE ADVICE GIVEN.

## 2014-09-26 NOTE — Telephone Encounter (Signed)
Carafate 1 gram tablets BID?

## 2014-09-27 ENCOUNTER — Encounter: Payer: Self-pay | Admitting: Family Medicine

## 2014-09-27 NOTE — Telephone Encounter (Signed)
He did not discuss this issue twice with me - just mentioned it in passing at physical.  I have not evaluated him for this issue.  As he had a fall ok to come in for lower back xray but if xrays ok and persistent pain will need eval by me or by [facility]. Xray ordered.

## 2014-09-29 NOTE — Telephone Encounter (Signed)
Patient notified and verbalized understanding. He will come in for xray and go from there.

## 2014-09-30 ENCOUNTER — Ambulatory Visit (INDEPENDENT_AMBULATORY_CARE_PROVIDER_SITE_OTHER)
Admission: RE | Admit: 2014-09-30 | Discharge: 2014-09-30 | Disposition: A | Discharge status: Home / Self Care | Payer: Medicare Other | Source: Ambulatory Visit | Attending: Family Medicine | Admitting: Family Medicine

## 2014-09-30 DIAGNOSIS — S3992XA Unspecified injury of lower back, initial encounter: Secondary | ICD-10-CM | POA: Diagnosis not present

## 2014-09-30 DIAGNOSIS — M545 Low back pain, unspecified: Secondary | ICD-10-CM

## 2014-10-01 ENCOUNTER — Encounter: Payer: Self-pay | Admitting: Family Medicine

## 2014-10-02 ENCOUNTER — Encounter: Payer: Self-pay | Admitting: Family Medicine

## 2014-10-02 ENCOUNTER — Ambulatory Visit (INDEPENDENT_AMBULATORY_CARE_PROVIDER_SITE_OTHER): Payer: Medicare Other | Admitting: Family Medicine

## 2014-10-02 VITALS — BP 126/78 | HR 96 | Temp 98.1°F | Wt 180.2 lb

## 2014-10-02 DIAGNOSIS — R1013 Epigastric pain: Secondary | ICD-10-CM

## 2014-10-02 DIAGNOSIS — N138 Other obstructive and reflux uropathy: Secondary | ICD-10-CM | POA: Insufficient documentation

## 2014-10-02 DIAGNOSIS — M545 Low back pain, unspecified: Secondary | ICD-10-CM | POA: Insufficient documentation

## 2014-10-02 DIAGNOSIS — R350 Frequency of micturition: Secondary | ICD-10-CM | POA: Diagnosis not present

## 2014-10-02 DIAGNOSIS — N401 Enlarged prostate with lower urinary tract symptoms: Secondary | ICD-10-CM

## 2014-10-02 DIAGNOSIS — R972 Elevated prostate specific antigen [PSA]: Secondary | ICD-10-CM

## 2014-10-02 DIAGNOSIS — F411 Generalized anxiety disorder: Secondary | ICD-10-CM | POA: Diagnosis not present

## 2014-10-02 MED ORDER — SULFAMETHOXAZOLE-TRIMETHOPRIM 800-160 MG PO TABS: 1.0000 | ORAL_TABLET | Freq: Two times a day (BID) | ORAL | Status: DC

## 2014-10-02 NOTE — Assessment & Plan Note (Signed)
Pt to call GI to notify hyoscyamine helping to see if they will refill. Discussed in future we can refill this med as well. H/o gastroparesis +? nonspecific spasm. ?IBS.

## 2014-10-02 NOTE — Progress Notes (Signed)
BP 126/78 mmHg  Pulse 96  Temp(Src) 98.1 F (36.7 C) (Oral)  Wt 180 lb 4 oz (81.761 kg)   CC: check back and urine frequency  Subjective:    Patient ID: Edward Chen, male    DOB: May 07, 1961, 54 y.o.   MRN: [id number]  HPI: Edward Chen is a 54 y.o. male presenting on 10/02/2014 for Follow-up   Frequent urination throughout the day. Increased cranberry juice without results. UA and UCx normal last visit. Celexa in past caused frequent urination. Denies rash, discharge, fevers/chills, dysuria. No hematuria or urgency. Some incomplete emptying and increased hesitancy. Last visit DRE was normal. Ongoing over last 4 weeks.  Fall December - tripped over dog toy. Persistent right lower back pain. xrays stable - see below. Denies radiculopathy or numbness or weakness of R leg. Right sided lower back pain is slowly improving.   Hyoscyamine has significantly helped epigastric pain.   Abnormal UDS - neg xanax (uses intermittently) but positive EtOH. Endorses drinking on weekends - 12 cans of beer/week. Denies mixing EtOH with xanax. Uses xanax for anxiety and chest pain with anxiety attacks.   LUMBAR SPINE - COMPLETE 4+ VIEW COMPARISON: MRI on 09/18/2004 FINDINGS: No evidence of fracture or subluxation. There is moderate disc space narrowing at L5-S1 with associated facet hypertrophy. No bony lesions. IMPRESSION: No acute fracture. Moderate spondylosis at L5-S1. Electronically Signed  By: Aletta Edouard M.D.  On: 09/30/2014 16:58  Relevant past medical, surgical, family and social history reviewed and updated as indicated. Interim medical history since our last visit reviewed. Allergies and medications reviewed and updated. Current Outpatient Prescriptions on File Prior to Visit  Medication Sig  . ALPRAZolam (XANAX) 0.5 MG tablet TAKE 1 TABLET 3 TIMES DAILY AS NEEDED  . baclofen (LIORESAL) 10 MG tablet Take 10 mg by mouth as needed for muscle spasms.  Marland Kitchen buPROPion (WELLBUTRIN)  100 MG tablet Take 100 mg by mouth 2 (two) times daily.  . fenofibrate 160 MG tablet TAKE 1 TABLET BY MOUTH EVERY DAY  . Hyoscyamine Sulfate 0.375 MG TBCR Take one tablet twice a day for 5 days, abdominal pain then as needed  . losartan (COZAAR) 100 MG tablet TAKE 1 TABLET BY MOUTH EVERY DAY  . Omega-3 Fatty Acids (FISH OIL) 1000 MG CAPS Take 1 capsule (1,000 mg total) by mouth daily.  Marland Kitchen oxyCODONE-acetaminophen (PERCOCET) 7.5-325 MG per tablet Take as directed by pain management    No current facility-administered medications on file prior to visit.    Review of Systems Per HPI unless specifically indicated above     Objective:    BP 126/78 mmHg  Pulse 96  Temp(Src) 98.1 F (36.7 C) (Oral)  Wt 180 lb 4 oz (81.761 kg)  Wt Readings from Last 3 Encounters:  10/02/14 180 lb 4 oz (81.761 kg)  09/17/14 176 lb 9.6 oz (80.105 kg)  09/15/14 179 lb 8 oz (81.421 kg)    Physical Exam  Constitutional: He appears well-developed and well-nourished. No distress.  Psychiatric: He has a normal mood and affect.  Nursing note and vitals reviewed.      Assessment & Plan:   Problem List Items Addressed This Visit    Urinary frequency    Recent UA/UCx normal. sxs suspicious for low grade prostate inflammation/infection. Treat with 2 wk course of bactrim, update with effect Pt agrees with plan. DRE last visit reassuring, no significant BPH on exam.      Right LBP - Primary    Slowly  improving, using narcotic as prescribed. Anticipate lumbar strain after fall. Reviewed recent xray - anticipate L5/S1 decreased disk space is chronic and not acute HNP (similar to MRI 2006).  As slowly improving, no further treatment or evaluation indicated.      Generalized anxiety disorder    Discussed xanax use - advised not to mix with EtOH.      Elevated PSA    Back to normal on recheck. rec yearly PSA.      Abdominal pain, epigastric    Pt to call GI to notify hyoscyamine helping to see if they  will refill. Discussed in future we can refill this med as well. H/o gastroparesis +? nonspecific spasm. ?IBS.           Follow up plan: Return if symptoms worsen or fail to improve.

## 2014-10-02 NOTE — Assessment & Plan Note (Signed)
Back to normal on recheck. rec yearly PSA.

## 2014-10-02 NOTE — Assessment & Plan Note (Signed)
Discussed xanax use - advised not to mix with EtOH.

## 2014-10-02 NOTE — Assessment & Plan Note (Signed)
Recent UA/UCx normal. sxs suspicious for low grade prostate inflammation/infection. Treat with 2 wk course of bactrim, update with effect Pt agrees with plan. DRE last visit reassuring, no significant BPH on exam.

## 2014-10-02 NOTE — Progress Notes (Signed)
Pre visit review using our clinic review tool, if applicable. No additional management support is needed unless otherwise documented below in the visit note. 

## 2014-10-02 NOTE — Assessment & Plan Note (Signed)
Slowly improving, using narcotic as prescribed. Anticipate lumbar strain after fall. Reviewed recent xray - anticipate L5/S1 decreased disk space is chronic and not acute HNP (similar to MRI 2006).  As slowly improving, no further treatment or evaluation indicated.

## 2014-10-02 NOTE — Patient Instructions (Addendum)
This could still be low grade prostate inflammation or infection - treat with 2 weeks of bactrim twice daily. Let me know if persistent symptoms after treatment. Let me know if worsening back pain or any shooting pain or numbness down the leg.

## 2014-10-06 ENCOUNTER — Other Ambulatory Visit: Payer: Self-pay

## 2014-10-06 ENCOUNTER — Telehealth: Payer: Self-pay | Admitting: Gastroenterology

## 2014-10-06 DIAGNOSIS — R1013 Epigastric pain: Secondary | ICD-10-CM

## 2014-10-06 MED ORDER — HYOSCYAMINE SULFATE ER 0.375 MG PO TBCR: EXTENDED_RELEASE_TABLET | ORAL | Status: DC

## 2014-10-06 MED ORDER — OMEPRAZOLE 20 MG PO CPDR: 20.0000 mg | DELAYED_RELEASE_CAPSULE | Freq: Every day | ORAL | Status: DC

## 2014-10-06 NOTE — Telephone Encounter (Signed)
I believed this message was closed early. We are starting him on Carafate. Please advise me on the dosing

## 2014-10-06 NOTE — Telephone Encounter (Signed)
New prescription for Prilosec and new dispensing amt for Hyomax escribed to the pharmacy.

## 2014-10-06 NOTE — Telephone Encounter (Signed)
yes

## 2014-10-06 NOTE — Telephone Encounter (Signed)
Cancel this message. The patient has changed his mind.

## 2014-10-06 NOTE — Telephone Encounter (Signed)
Patient has created his on regimen for treatment of his abdominal symptoms. He takes Prilosec OTC every 2 to 3 days . He takes the Hyomax daily and PRN. He asks if he can continue this for now? Can he have a prescription for Prilosec and 60 of the Hyomax at a time?

## 2014-10-09 ENCOUNTER — Ambulatory Visit: Payer: Medicare Other | Admitting: Gastroenterology

## 2014-10-10 DIAGNOSIS — G894 Chronic pain syndrome: Secondary | ICD-10-CM | POA: Diagnosis not present

## 2014-10-10 DIAGNOSIS — K5900 Constipation, unspecified: Secondary | ICD-10-CM | POA: Diagnosis not present

## 2014-10-10 DIAGNOSIS — F329 Major depressive disorder, single episode, unspecified: Secondary | ICD-10-CM | POA: Diagnosis not present

## 2014-10-10 DIAGNOSIS — R0789 Other chest pain: Secondary | ICD-10-CM | POA: Diagnosis not present

## 2014-10-16 ENCOUNTER — Telehealth: Payer: Self-pay

## 2014-10-16 MED ORDER — TAMSULOSIN HCL 0.4 MG PO CAPS: 0.4000 mg | ORAL_CAPSULE | Freq: Every day | ORAL | Status: DC

## 2014-10-16 NOTE — Telephone Encounter (Signed)
When seen 10/02/2014 pt told me back pain was continually improving - has pain recurred? If pain not worsening, does not need further imaging. I recommend trial of flomax to open up urine passage as this may be causing increased frequency. If this doesn't help would offer referral to urologist for further evaluation.

## 2014-10-16 NOTE — Telephone Encounter (Signed)
Pt was seen 10/02/14; pt finished abx and pt feels like needs to urinate all the time; pt is voiding OK but feels like needs to void all the time. Pt said back pain is same as when seen on 10/02/14; pain is rt side of back above the waist. No pain or numbness radiating to legs. Pt said he fell on a piece of wood at Christmas and pt request testing (CT or MRI or Kidney area). Pt request cb.

## 2014-10-16 NOTE — Telephone Encounter (Signed)
Spoke with patient. He said back pain is just intermittent. No worse, no better. Advised no imaging was needed. Advised to try the flomax and call if that doesn't help for urology referral. He verbalized understanding.

## 2014-10-22 ENCOUNTER — Telehealth: Payer: Self-pay | Admitting: Gastroenterology

## 2014-10-22 ENCOUNTER — Other Ambulatory Visit: Payer: Self-pay

## 2014-10-22 MED ORDER — ALPRAZOLAM 0.5 MG PO TABS: 0.5000 mg | ORAL_TABLET | Freq: Three times a day (TID) | ORAL | Status: DC | PRN

## 2014-10-22 NOTE — Telephone Encounter (Signed)
Pt left v/m requesting refill alprazolam to [facility].Please advise. Pt last f/u appt 10/02/14.

## 2014-10-22 NOTE — Telephone Encounter (Signed)
Chart not needed °

## 2014-10-22 NOTE — Telephone Encounter (Signed)
plz phone in. 

## 2014-10-23 NOTE — Telephone Encounter (Signed)
Rx called in to pharmacy. 

## 2014-10-31 ENCOUNTER — Ambulatory Visit: Payer: Medicare Other | Admitting: Gastroenterology

## 2014-11-03 ENCOUNTER — Telehealth: Payer: Self-pay | Admitting: Family Medicine

## 2014-11-03 DIAGNOSIS — R350 Frequency of micturition: Secondary | ICD-10-CM

## 2014-11-03 NOTE — Telephone Encounter (Signed)
Patient was to call with an update on how tamsulosin was working prior to referral. Spoke with patient and he said it seemed to work when he was up and moving around, but when he was just laying around (which is most of the time), it doesn't seem to help much. He requests referral now.

## 2014-11-03 NOTE — Telephone Encounter (Signed)
Pt called checking on his urology appointment No referral in system

## 2014-11-03 NOTE — Telephone Encounter (Signed)
Referral placed.

## 2014-11-11 DIAGNOSIS — R3915 Urgency of urination: Secondary | ICD-10-CM | POA: Diagnosis not present

## 2014-12-09 DIAGNOSIS — R0789 Other chest pain: Secondary | ICD-10-CM | POA: Diagnosis not present

## 2014-12-09 DIAGNOSIS — F329 Major depressive disorder, single episode, unspecified: Secondary | ICD-10-CM | POA: Diagnosis not present

## 2014-12-09 DIAGNOSIS — K5900 Constipation, unspecified: Secondary | ICD-10-CM | POA: Diagnosis not present

## 2014-12-09 DIAGNOSIS — G894 Chronic pain syndrome: Secondary | ICD-10-CM | POA: Diagnosis not present

## 2014-12-25 DIAGNOSIS — R828 Abnormal findings on cytological and histological examination of urine: Secondary | ICD-10-CM | POA: Diagnosis not present

## 2014-12-25 DIAGNOSIS — R3915 Urgency of urination: Secondary | ICD-10-CM | POA: Diagnosis not present

## 2014-12-26 ENCOUNTER — Other Ambulatory Visit: Payer: Self-pay | Admitting: Family Medicine

## 2014-12-26 ENCOUNTER — Encounter: Payer: Self-pay | Admitting: Family Medicine

## 2015-01-13 DIAGNOSIS — M25561 Pain in right knee: Secondary | ICD-10-CM | POA: Diagnosis not present

## 2015-02-05 DIAGNOSIS — Z79891 Long term (current) use of opiate analgesic: Secondary | ICD-10-CM | POA: Diagnosis not present

## 2015-02-06 DIAGNOSIS — G894 Chronic pain syndrome: Secondary | ICD-10-CM | POA: Diagnosis not present

## 2015-02-06 DIAGNOSIS — Z79891 Long term (current) use of opiate analgesic: Secondary | ICD-10-CM | POA: Diagnosis not present

## 2015-02-06 DIAGNOSIS — F329 Major depressive disorder, single episode, unspecified: Secondary | ICD-10-CM | POA: Diagnosis not present

## 2015-02-06 DIAGNOSIS — K5900 Constipation, unspecified: Secondary | ICD-10-CM | POA: Diagnosis not present

## 2015-02-06 DIAGNOSIS — R0789 Other chest pain: Secondary | ICD-10-CM | POA: Diagnosis not present

## 2015-03-05 ENCOUNTER — Other Ambulatory Visit: Payer: Self-pay | Admitting: Gastroenterology

## 2015-03-05 ENCOUNTER — Telehealth: Payer: Self-pay | Admitting: Gastroenterology

## 2015-03-05 NOTE — Telephone Encounter (Signed)
Already sent in electronically

## 2015-03-06 NOTE — Telephone Encounter (Signed)
Refill was sent in on 6/23  Called pharmacy to verify  Called patient to inform

## 2015-03-20 ENCOUNTER — Other Ambulatory Visit: Payer: Self-pay | Admitting: Orthopedic Surgery

## 2015-03-20 DIAGNOSIS — M25561 Pain in right knee: Secondary | ICD-10-CM

## 2015-04-02 ENCOUNTER — Ambulatory Visit
Admission: RE | Admit: 2015-04-02 | Discharge: 2015-04-02 | Disposition: A | Discharge status: Home / Self Care | Payer: Medicare Other | Source: Ambulatory Visit | Attending: Orthopedic Surgery | Admitting: Orthopedic Surgery

## 2015-04-02 DIAGNOSIS — M25561 Pain in right knee: Secondary | ICD-10-CM | POA: Diagnosis not present

## 2015-04-13 DIAGNOSIS — K5900 Constipation, unspecified: Secondary | ICD-10-CM | POA: Diagnosis not present

## 2015-04-13 DIAGNOSIS — F329 Major depressive disorder, single episode, unspecified: Secondary | ICD-10-CM | POA: Diagnosis not present

## 2015-04-13 DIAGNOSIS — G894 Chronic pain syndrome: Secondary | ICD-10-CM | POA: Diagnosis not present

## 2015-04-13 DIAGNOSIS — R0789 Other chest pain: Secondary | ICD-10-CM | POA: Diagnosis not present

## 2015-04-14 DIAGNOSIS — M25561 Pain in right knee: Secondary | ICD-10-CM | POA: Diagnosis not present

## 2015-04-20 ENCOUNTER — Other Ambulatory Visit: Payer: Self-pay | Admitting: Physician Assistant

## 2015-04-20 NOTE — H&P (Signed)
HPI: Edward Chen comes in with a new problem with his right knee.  He just woke up a couple of weeks ago with a pain anterolateral aspect of his right knee.  Sharp, stabbing.  Relatively constant.  Some mechanical symptoms.  Activity does not seem to make it worse.  It is when he gets off his feet that it bothers him more.  Some swelling.  No specific trauma.  He has had numerous chronic issues with his spine and is on chronic Percocet.  He does not feel like this is coming from his back however.  No numbness, no tingling distally.   History and general exam are outlined and included in the chart.  EXAMINATION: Specifically, he has some decreased cervical, thoracic, and lumbar motion.  He is neurovascularly intact.  I do not get much in the way of straight leg raising either side.  Negative log roll both sides.  The right knee is point tender lateral joint line slightly anterior.  Pain with McMurray's.  Trace effusion.  Full motion.  Stable ligaments.  Nothing on the medial side.    X-RAYS: Four view standing x-ray shows about 50% decreased joint space medial aspect right equaling left.  Nothing else acute.  IMPRESSION: I am concerned about an attritional tear anterolateral meniscus.  I am hoping this is not radicular from his back.  DISPOSITION: We are going to try a diagnostic therapeutic injection.  If this is helpful with Marcaine, but does not last then the next step would be an MRI to look at his knee.  Went over this with him and he understands.  He will let me know how he does after injection.  Obviously if we get no improvement even with Marcaine, I am going to have to look elsewhere than his knee for the source of his symptoms.    PROCEDURE: The patient's clinical condition is marked by substantial pain and/or significant functional disability. Other conservative therapy has not provided relief, is contraindicated, or not   appropriate. There is a reasonable likelihood that injection will  significantly improve the patient's pain and/or functional disability.   After appropriate consent and under sterile technique intra-articular injection, right knee, with Depo-Medrol and Marcaine.  Tolerated this well.  Ninetta Lights, M.D.  Addendum:   I saw Edward Chen in follow-up.  The MRI is complete. He has a lateral meniscus tear extending into a large lateral meniscal cyst. Since I saw him last he is getting more symptomatic with more and more localized swelling laterally, especially at the end of the day. This is very consistent with his findings. Past Medical, Family and   DISPOSITION: More than 25 minutes was spent face to face covering everything with him. The need for operative treatment is straightforward. He understands and agrees. Examination under anesthesia, arthroscopy and taking care of the meniscus. Given the size of his cyst I will do an open excision of the extra-articular cyst and then repair the margin of the lateral meniscus. I told him why that needs to be done in addition to the scope. I discussed what to expect intra-operative and post-operative. Paperwork completed.   All questions were encouraged and answered. I will see him back at the operative intervention.    Ninetta Lights, M.D.

## 2015-04-28 ENCOUNTER — Other Ambulatory Visit: Payer: Self-pay | Admitting: Physician Assistant

## 2015-04-28 ENCOUNTER — Encounter (HOSPITAL_BASED_OUTPATIENT_CLINIC_OR_DEPARTMENT_OTHER): Payer: Self-pay | Admitting: *Deleted

## 2015-04-28 NOTE — H&P (Signed)
Edward Chen comes in with a new problem with his right knee.  He just woke up a couple of weeks ago with a pain anterolateral aspect of his right knee.  Sharp, stabbing.  Relatively constant.  Some mechanical symptoms.  Activity does not seem to make it worse.  It is when he gets off his feet that it bothers him more.  Some swelling.  No specific trauma.  He has had numerous chronic issues with his spine and is on chronic Percocet.  He does not feel like this is coming from his back however.  No numbness, no tingling distally.   History and general exam are outlined and included in the chart.  EXAMINATION: Specifically, he has some decreased cervical, thoracic, and lumbar motion.  He is neurovascularly intact.  I do not get much in the way of straight leg raising either side.  Negative log roll both sides.  The right knee is point tender lateral joint line slightly anterior.  Pain with McMurray's.  Trace effusion.  Full motion.  Stable ligaments.  Nothing on the medial side.    X-RAYS: Four view standing x-ray shows about 50% decreased joint space medial aspect right equaling left.  Nothing else acute.  IMPRESSION: I am concerned about an attritional tear anterolateral meniscus.  I am hoping this is not radicular from his back.  DISPOSITION: We are going to try a diagnostic therapeutic injection.  If this is helpful with Marcaine, but does not last then the next step would be an MRI to look at his knee.  Went over this with him and he understands.  He will let me know how he does after injection.  Obviously if we get no improvement even with Marcaine, I am going to have to look elsewhere than his knee for the source of his symptoms.    PROCEDURE: The patient's clinical condition is marked by substantial pain and/or significant functional disability. Other conservative therapy has not provided relief, is contraindicated, or not appropriate. There is a reasonable likelihood that injection will significantly  improve the patient's pain and/or functional disability.   After appropriate consent and under sterile technique intra-articular injection, right knee, with Depo-Medrol and Marcaine.  Tolerated this well.  Ninetta Lights, M.D.  I saw Edward Chen in follow-up.  The MRI is complete. He has a lateral meniscus tear extending into a large lateral meniscal cyst. Since I saw him last he is getting more symptomatic with more and more localized swelling laterally, especially at the end of the day. This is very consistent with his findings. Past Medical, Family and   DISPOSITION: More than 25 minutes was spent face to face covering everything with him. The need for operative treatment is straightforward. He understands and agrees. Examination under anesthesia, arthroscopy and taking care of the meniscus. Given the size of his cyst I will do an open excision of the extra-articular cyst and then repair the margin of the lateral meniscus. I told him why that needs to be done in addition to the scope. I discussed what to expect intra-operative and post-operative. Paperwork completed.   All questions were encouraged and answered. I will see him back at the operative intervention.    Ninetta Lights, M.D. Electronically verified by Ninetta Lights, M.D.

## 2015-04-29 ENCOUNTER — Other Ambulatory Visit: Payer: Self-pay | Admitting: Physician Assistant

## 2015-04-29 ENCOUNTER — Other Ambulatory Visit: Payer: Self-pay

## 2015-04-29 ENCOUNTER — Encounter (HOSPITAL_BASED_OUTPATIENT_CLINIC_OR_DEPARTMENT_OTHER)
Admission: RE | Admit: 2015-04-29 | Discharge: 2015-04-29 | Disposition: A | Discharge status: Home / Self Care | Payer: Medicare Other | Source: Ambulatory Visit | Attending: Orthopedic Surgery | Admitting: Orthopedic Surgery

## 2015-04-29 DIAGNOSIS — K3184 Gastroparesis: Secondary | ICD-10-CM | POA: Diagnosis not present

## 2015-04-29 DIAGNOSIS — F329 Major depressive disorder, single episode, unspecified: Secondary | ICD-10-CM | POA: Diagnosis not present

## 2015-04-29 DIAGNOSIS — M1990 Unspecified osteoarthritis, unspecified site: Secondary | ICD-10-CM | POA: Diagnosis not present

## 2015-04-29 DIAGNOSIS — Z79891 Long term (current) use of opiate analgesic: Secondary | ICD-10-CM | POA: Diagnosis not present

## 2015-04-29 DIAGNOSIS — I10 Essential (primary) hypertension: Secondary | ICD-10-CM | POA: Diagnosis not present

## 2015-04-29 DIAGNOSIS — M23061 Cystic meniscus, other lateral meniscus, right knee: Secondary | ICD-10-CM | POA: Diagnosis not present

## 2015-04-29 DIAGNOSIS — Z91013 Allergy to seafood: Secondary | ICD-10-CM | POA: Diagnosis not present

## 2015-04-29 DIAGNOSIS — K219 Gastro-esophageal reflux disease without esophagitis: Secondary | ICD-10-CM | POA: Diagnosis not present

## 2015-04-29 DIAGNOSIS — J45909 Unspecified asthma, uncomplicated: Secondary | ICD-10-CM | POA: Diagnosis not present

## 2015-04-29 DIAGNOSIS — Z91010 Allergy to peanuts: Secondary | ICD-10-CM | POA: Diagnosis not present

## 2015-04-29 DIAGNOSIS — G894 Chronic pain syndrome: Secondary | ICD-10-CM | POA: Diagnosis not present

## 2015-04-29 DIAGNOSIS — Z79899 Other long term (current) drug therapy: Secondary | ICD-10-CM | POA: Diagnosis not present

## 2015-04-29 DIAGNOSIS — E785 Hyperlipidemia, unspecified: Secondary | ICD-10-CM | POA: Diagnosis not present

## 2015-04-29 DIAGNOSIS — N401 Enlarged prostate with lower urinary tract symptoms: Secondary | ICD-10-CM | POA: Diagnosis not present

## 2015-04-29 DIAGNOSIS — Z888 Allergy status to other drugs, medicaments and biological substances status: Secondary | ICD-10-CM | POA: Diagnosis not present

## 2015-04-29 DIAGNOSIS — F17210 Nicotine dependence, cigarettes, uncomplicated: Secondary | ICD-10-CM | POA: Diagnosis not present

## 2015-04-29 DIAGNOSIS — Z883 Allergy status to other anti-infective agents status: Secondary | ICD-10-CM | POA: Diagnosis not present

## 2015-04-29 DIAGNOSIS — F419 Anxiety disorder, unspecified: Secondary | ICD-10-CM | POA: Diagnosis not present

## 2015-04-29 LAB — BASIC METABOLIC PANEL
ANION GAP: 11 (ref 5–15)
BUN: 11 mg/dL (ref 6–20)
CO2: 23 mmol/L (ref 22–32)
Calcium: 9.6 mg/dL (ref 8.9–10.3)
Chloride: 105 mmol/L (ref 101–111)
Creatinine, Ser: 0.94 mg/dL (ref 0.61–1.24)
Glucose, Bld: 122 mg/dL — ABNORMAL HIGH (ref 65–99)
POTASSIUM: 4.4 mmol/L (ref 3.5–5.1)
SODIUM: 139 mmol/L (ref 135–145)

## 2015-04-29 NOTE — H&P (Signed)
Ayvin comes in with a new problem with his right knee.  He just woke up a couple of weeks ago with a pain anterolateral aspect of his right knee.  Sharp, stabbing.  Relatively constant.  Some mechanical symptoms.  Activity does not seem to make it worse.  It is when he gets off his feet that it bothers him more.  Some swelling.  No specific trauma.  He has had numerous chronic issues with his spine and is on chronic Percocet.  He does not feel like this is coming from his back however.  No numbness, no tingling distally.   History and general exam are outlined and included in the chart.  EXAMINATION: Specifically, he has some decreased cervical, thoracic, and lumbar motion.  He is neurovascularly intact.  I do not get much in the way of straight leg raising either side.  Negative log roll both sides.  The right knee is point tender lateral joint line slightly anterior.  Pain with McMurray's.  Trace effusion.  Full motion.  Stable ligaments.  Nothing on the medial side.    X-RAYS: Four view standing x-ray shows about 50% decreased joint space medial aspect right equaling left.  Nothing else acute.  IMPRESSION: I am concerned about an attritional tear anterolateral meniscus.  I am hoping this is not radicular from his back.  DISPOSITION: We are going to try a diagnostic therapeutic injection.  If this is helpful with Marcaine, but does not last then the next step would be an MRI to look at his knee.  Went over this with him and he understands.  He will let me know how he does after injection.  Obviously if we get no improvement even with Marcaine, I am going to have to look elsewhere than his knee for the source of his symptoms.    PROCEDURE: The patient's clinical condition is marked by substantial pain and/or significant functional disability. Other conservative therapy has not provided relief, is contraindicated, or not   appropriate. There is a reasonable likelihood that injection will  significantly improve the patient's pain and/or functional disability.   After appropriate consent and under sterile technique intra-articular injection, right knee, with Depo-Medrol and Marcaine.  Tolerated this well.  Ninetta Lights, M.D.  Addendum:  I saw Thane in follow-up.  The MRI is complete. He has a lateral meniscus tear extending into a large lateral meniscal cyst. Since I saw him last he is getting more symptomatic with more and more localized swelling laterally, especially at the end of the day. This is very consistent with his findings. Past Medical, Family and   DISPOSITION: More than 25 minutes was spent face to face covering everything with him. The need for operative treatment is straightforward. He understands and agrees. Examination under anesthesia, arthroscopy and taking care of the meniscus. Given the size of his cyst I will do an open excision of the extra-articular cyst and then repair the margin of the lateral meniscus. I told him why that needs to be done in addition to the scope. I discussed what to expect intra-operative and post-operative. Paperwork completed.   All questions were encouraged and answered. I will see him back at the operative intervention.    Ninetta Lights, M.D. Electronically verified by Ninetta Lights, M.D.

## 2015-04-30 ENCOUNTER — Ambulatory Visit (HOSPITAL_BASED_OUTPATIENT_CLINIC_OR_DEPARTMENT_OTHER): Payer: Medicare Other | Admitting: Certified Registered"

## 2015-04-30 ENCOUNTER — Encounter (HOSPITAL_BASED_OUTPATIENT_CLINIC_OR_DEPARTMENT_OTHER): Payer: Self-pay

## 2015-04-30 ENCOUNTER — Encounter (HOSPITAL_BASED_OUTPATIENT_CLINIC_OR_DEPARTMENT_OTHER)
Admission: RE | Disposition: A | Discharge status: Home / Self Care | Payer: Self-pay | Source: Ambulatory Visit | Attending: Orthopedic Surgery

## 2015-04-30 ENCOUNTER — Ambulatory Visit (HOSPITAL_BASED_OUTPATIENT_CLINIC_OR_DEPARTMENT_OTHER)
Admission: RE | Admit: 2015-04-30 | Discharge: 2015-04-30 | Disposition: A | Discharge status: Home / Self Care | Payer: Medicare Other | Source: Ambulatory Visit | Attending: Orthopedic Surgery | Admitting: Orthopedic Surgery

## 2015-04-30 DIAGNOSIS — I10 Essential (primary) hypertension: Secondary | ICD-10-CM | POA: Diagnosis not present

## 2015-04-30 DIAGNOSIS — Z91013 Allergy to seafood: Secondary | ICD-10-CM | POA: Insufficient documentation

## 2015-04-30 DIAGNOSIS — Z883 Allergy status to other anti-infective agents status: Secondary | ICD-10-CM | POA: Diagnosis not present

## 2015-04-30 DIAGNOSIS — F17210 Nicotine dependence, cigarettes, uncomplicated: Secondary | ICD-10-CM | POA: Insufficient documentation

## 2015-04-30 DIAGNOSIS — N401 Enlarged prostate with lower urinary tract symptoms: Secondary | ICD-10-CM | POA: Diagnosis not present

## 2015-04-30 DIAGNOSIS — Z79899 Other long term (current) drug therapy: Secondary | ICD-10-CM | POA: Diagnosis not present

## 2015-04-30 DIAGNOSIS — S83282A Other tear of lateral meniscus, current injury, left knee, initial encounter: Secondary | ICD-10-CM | POA: Diagnosis not present

## 2015-04-30 DIAGNOSIS — F419 Anxiety disorder, unspecified: Secondary | ICD-10-CM | POA: Diagnosis not present

## 2015-04-30 DIAGNOSIS — Z91010 Allergy to peanuts: Secondary | ICD-10-CM | POA: Diagnosis not present

## 2015-04-30 DIAGNOSIS — E785 Hyperlipidemia, unspecified: Secondary | ICD-10-CM | POA: Diagnosis not present

## 2015-04-30 DIAGNOSIS — J45909 Unspecified asthma, uncomplicated: Secondary | ICD-10-CM | POA: Insufficient documentation

## 2015-04-30 DIAGNOSIS — G894 Chronic pain syndrome: Secondary | ICD-10-CM | POA: Insufficient documentation

## 2015-04-30 DIAGNOSIS — S83281A Other tear of lateral meniscus, current injury, right knee, initial encounter: Secondary | ICD-10-CM | POA: Diagnosis not present

## 2015-04-30 DIAGNOSIS — F329 Major depressive disorder, single episode, unspecified: Secondary | ICD-10-CM | POA: Insufficient documentation

## 2015-04-30 DIAGNOSIS — M23061 Cystic meniscus, other lateral meniscus, right knee: Secondary | ICD-10-CM | POA: Diagnosis not present

## 2015-04-30 DIAGNOSIS — M23000 Cystic meniscus, unspecified lateral meniscus, right knee: Secondary | ICD-10-CM | POA: Diagnosis not present

## 2015-04-30 DIAGNOSIS — Z79891 Long term (current) use of opiate analgesic: Secondary | ICD-10-CM | POA: Diagnosis not present

## 2015-04-30 DIAGNOSIS — Z888 Allergy status to other drugs, medicaments and biological substances status: Secondary | ICD-10-CM | POA: Insufficient documentation

## 2015-04-30 DIAGNOSIS — K3184 Gastroparesis: Secondary | ICD-10-CM | POA: Insufficient documentation

## 2015-04-30 DIAGNOSIS — K219 Gastro-esophageal reflux disease without esophagitis: Secondary | ICD-10-CM | POA: Insufficient documentation

## 2015-04-30 DIAGNOSIS — M1990 Unspecified osteoarthritis, unspecified site: Secondary | ICD-10-CM | POA: Insufficient documentation

## 2015-04-30 DIAGNOSIS — M2241 Chondromalacia patellae, right knee: Secondary | ICD-10-CM | POA: Diagnosis not present

## 2015-04-30 HISTORY — PX: KNEE ARTHROSCOPY WITH LATERAL MENISECTOMY: SHX6193

## 2015-04-30 HISTORY — PX: KNEE ARTHROSCOPY WITH MENISCAL REPAIR: SHX5653

## 2015-04-30 HISTORY — PX: EAR CYST EXCISION: SHX22

## 2015-04-30 LAB — POCT HEMOGLOBIN-HEMACUE: HEMOGLOBIN: 16.3 g/dL (ref 13.0–17.0)

## 2015-04-30 SURGERY — ARTHROSCOPY, KNEE, WITH LATERAL MENISCECTOMY
Anesthesia: General | Site: Knee | Laterality: Right

## 2015-04-30 MED ORDER — METHYLPREDNISOLONE ACETATE 80 MG/ML IJ SUSP
INTRAMUSCULAR | Status: DC | PRN
  Administered 2015-04-30: 80 mg

## 2015-04-30 MED ORDER — MIDAZOLAM HCL 2 MG/2ML IJ SOLN
1.0000 mg | INTRAMUSCULAR | Status: DC | PRN
  Administered 2015-04-30: 2 mg via INTRAVENOUS

## 2015-04-30 MED ORDER — PROPOFOL 10 MG/ML IV BOLUS
INTRAVENOUS | Status: DC | PRN
Start: 2015-04-30 — End: 2015-04-30
  Administered 2015-04-30: 200 mg via INTRAVENOUS

## 2015-04-30 MED ORDER — CEFAZOLIN SODIUM-DEXTROSE 2-3 GM-% IV SOLR
INTRAVENOUS | Status: AC
  Filled 2015-04-30: qty 50

## 2015-04-30 MED ORDER — FENTANYL CITRATE (PF) 100 MCG/2ML IJ SOLN
50.0000 ug | INTRAMUSCULAR | Status: AC | PRN
  Administered 2015-04-30 (×3): 25 ug via INTRAVENOUS
  Administered 2015-04-30: 50 ug via INTRAVENOUS
  Administered 2015-04-30: 25 ug via INTRAVENOUS
  Administered 2015-04-30: 50 ug via INTRAVENOUS
  Administered 2015-04-30 (×4): 25 ug via INTRAVENOUS

## 2015-04-30 MED ORDER — CEFAZOLIN SODIUM-DEXTROSE 2-3 GM-% IV SOLR
2.0000 g | INTRAVENOUS | Status: AC
  Administered 2015-04-30: 2 g via INTRAVENOUS

## 2015-04-30 MED ORDER — DEXAMETHASONE SODIUM PHOSPHATE 4 MG/ML IJ SOLN
INTRAMUSCULAR | Status: DC | PRN
  Administered 2015-04-30: 10 mg via INTRAVENOUS

## 2015-04-30 MED ORDER — KETOROLAC TROMETHAMINE 30 MG/ML IJ SOLN
INTRAMUSCULAR | Status: DC | PRN
  Administered 2015-04-30: 30 mg via INTRAVENOUS

## 2015-04-30 MED ORDER — CHLORHEXIDINE GLUCONATE 4 % EX LIQD: 60.0000 mL | Freq: Once | CUTANEOUS | Status: DC

## 2015-04-30 MED ORDER — SCOPOLAMINE 1 MG/3DAYS TD PT72: 1.0000 | MEDICATED_PATCH | Freq: Once | TRANSDERMAL | Status: DC | PRN

## 2015-04-30 MED ORDER — OXYCODONE HCL 5 MG/5ML PO SOLN: 5.0000 mg | Freq: Once | ORAL | Status: AC | PRN

## 2015-04-30 MED ORDER — FENTANYL CITRATE (PF) 100 MCG/2ML IJ SOLN: 25.0000 ug | INTRAMUSCULAR | Status: DC | PRN

## 2015-04-30 MED ORDER — BUPIVACAINE HCL (PF) 0.5 % IJ SOLN
INTRAMUSCULAR | Status: DC | PRN
  Administered 2015-04-30: 20 mL

## 2015-04-30 MED ORDER — FENTANYL CITRATE (PF) 100 MCG/2ML IJ SOLN
INTRAMUSCULAR | Status: AC
  Filled 2015-04-30: qty 6

## 2015-04-30 MED ORDER — SODIUM CHLORIDE 0.9 % IR SOLN
Status: DC | PRN
  Administered 2015-04-30: 3000 mL

## 2015-04-30 MED ORDER — HYDROMORPHONE HCL 1 MG/ML IJ SOLN
0.2500 mg | INTRAMUSCULAR | Status: DC | PRN
  Administered 2015-04-30 (×4): 0.5 mg via INTRAVENOUS

## 2015-04-30 MED ORDER — ONDANSETRON HCL 4 MG/2ML IJ SOLN
INTRAMUSCULAR | Status: DC | PRN
  Administered 2015-04-30: 4 mg via INTRAVENOUS

## 2015-04-30 MED ORDER — OXYCODONE HCL 5 MG PO TABS
10.0000 mg | ORAL_TABLET | Freq: Once | ORAL | Status: AC | PRN
  Administered 2015-04-30: 10 mg via ORAL
  Filled 2015-04-30: qty 2

## 2015-04-30 MED ORDER — LACTATED RINGERS IV SOLN: INTRAVENOUS | Status: DC

## 2015-04-30 MED ORDER — LIDOCAINE HCL (CARDIAC) 20 MG/ML IV SOLN
INTRAVENOUS | Status: DC | PRN
  Administered 2015-04-30: 60 mg via INTRAVENOUS

## 2015-04-30 MED ORDER — MIDAZOLAM HCL 2 MG/2ML IJ SOLN
INTRAMUSCULAR | Status: AC
  Filled 2015-04-30: qty 2

## 2015-04-30 MED ORDER — HYDROMORPHONE HCL 1 MG/ML IJ SOLN
INTRAMUSCULAR | Status: AC
  Filled 2015-04-30: qty 1

## 2015-04-30 MED ORDER — LACTATED RINGERS IV SOLN
INTRAVENOUS | Status: DC
  Administered 2015-04-30 (×2): via INTRAVENOUS

## 2015-04-30 MED ORDER — PROMETHAZINE HCL 25 MG/ML IJ SOLN: 6.2500 mg | INTRAMUSCULAR | Status: DC | PRN

## 2015-04-30 MED ORDER — GLYCOPYRROLATE 0.2 MG/ML IJ SOLN: 0.2000 mg | Freq: Once | INTRAMUSCULAR | Status: DC | PRN

## 2015-04-30 SURGICAL SUPPLY — 56 items
ANCH SUT SHRT 12.5 CANN EYLT (Anchor) ×1 IMPLANT
ANCHOR SUT BIOCOMP LK 2.9X12.5 (Anchor) ×2 IMPLANT
BANDAGE ELASTIC 6 VELCRO ST LF (GAUZE/BANDAGES/DRESSINGS) ×3 IMPLANT
BANDAGE ESMARK 6X9 LF (GAUZE/BANDAGES/DRESSINGS) ×1 IMPLANT
BLADE CUDA 5.5 (BLADE) IMPLANT
BLADE CUDA GRT WHITE 3.5 (BLADE) IMPLANT
BLADE CUTTER GATOR 3.5 (BLADE) ×3 IMPLANT
BLADE CUTTER MENIS 5.5 (BLADE) IMPLANT
BLADE GREAT WHITE 4.2 (BLADE) ×2 IMPLANT
BLADE GREAT WHITE 4.2MM (BLADE) ×1
BLADE SURG 15 STRL LF DISP TIS (BLADE) IMPLANT
BLADE SURG 15 STRL SS (BLADE) ×3
BNDG CMPR 9X6 STRL LF SNTH (GAUZE/BANDAGES/DRESSINGS) ×1
BNDG ESMARK 6X9 LF (GAUZE/BANDAGES/DRESSINGS) ×3
BUR OVAL 4.0 (BURR) IMPLANT
CUTTER MENISCUS  4.2MM (BLADE)
CUTTER MENISCUS 4.2MM (BLADE) IMPLANT
DRAPE ARTHROSCOPY W/POUCH 90 (DRAPES) ×3 IMPLANT
DURAPREP 26ML APPLICATOR (WOUND CARE) ×3 IMPLANT
ELECT MENISCUS 165MM 90D (ELECTRODE) IMPLANT
ELECT REM PT RETURN 9FT ADLT (ELECTROSURGICAL) ×3
ELECTRODE REM PT RTRN 9FT ADLT (ELECTROSURGICAL) ×1 IMPLANT
GAUZE SPONGE 4X4 12PLY STRL (GAUZE/BANDAGES/DRESSINGS) ×6 IMPLANT
GAUZE XEROFORM 1X8 LF (GAUZE/BANDAGES/DRESSINGS) ×3 IMPLANT
GLOVE BIOGEL PI IND STRL 7.0 (GLOVE) ×1 IMPLANT
GLOVE BIOGEL PI INDICATOR 7.0 (GLOVE) ×6
GLOVE ECLIPSE 6.5 STRL STRAW (GLOVE) ×2 IMPLANT
GLOVE ECLIPSE 7.0 STRL STRAW (GLOVE) ×3 IMPLANT
GLOVE ORTHO TXT STRL SZ7.5 (GLOVE) ×3 IMPLANT
GLOVE SURG ORTHO 8.0 STRL STRW (GLOVE) ×3 IMPLANT
GOWN STRL REUS W/ TWL LRG LVL3 (GOWN DISPOSABLE) ×2 IMPLANT
GOWN STRL REUS W/ TWL XL LVL3 (GOWN DISPOSABLE) ×1 IMPLANT
GOWN STRL REUS W/TWL LRG LVL3 (GOWN DISPOSABLE) ×6
GOWN STRL REUS W/TWL XL LVL3 (GOWN DISPOSABLE) ×3
HOLDER KNEE FOAM BLUE (MISCELLANEOUS) ×3 IMPLANT
IV NS IRRIG 3000ML ARTHROMATIC (IV SOLUTION) ×6 IMPLANT
KIT PUSHLOCK 2.9 HIP (KITS) ×2 IMPLANT
KNEE WRAP E Z 3 GEL PACK (MISCELLANEOUS) ×2 IMPLANT
MANIFOLD NEPTUNE II (INSTRUMENTS) ×3 IMPLANT
PACK ARTHROSCOPY DSU (CUSTOM PROCEDURE TRAY) ×3 IMPLANT
PACK BASIN DAY SURGERY FS (CUSTOM PROCEDURE TRAY) ×3 IMPLANT
PENCIL BUTTON HOLSTER BLD 10FT (ELECTRODE) ×3 IMPLANT
SET ARTHROSCOPY TUBING (MISCELLANEOUS) ×3
SET ARTHROSCOPY TUBING LN (MISCELLANEOUS) ×1 IMPLANT
SPONGE LAP 4X18 X RAY DECT (DISPOSABLE) ×3 IMPLANT
SUCTION FRAZIER TIP 10 FR DISP (SUCTIONS) ×2 IMPLANT
SUT ETHILON 3 0 PS 1 (SUTURE) ×3 IMPLANT
SUT FIBERWIRE 2-0 18 17.9 3/8 (SUTURE) ×3
SUT VIC AB 0 SH 27 (SUTURE) ×4 IMPLANT
SUT VIC AB 2-0 SH 27 (SUTURE) ×3
SUT VIC AB 2-0 SH 27XBRD (SUTURE) IMPLANT
SUT VIC AB 3-0 FS2 27 (SUTURE) IMPLANT
SUTURE FIBERWR 2-0 18 17.9 3/8 (SUTURE) IMPLANT
TOWEL OR 17X24 6PK STRL BLUE (TOWEL DISPOSABLE) ×3 IMPLANT
WATER STERILE IRR 1000ML POUR (IV SOLUTION) ×3 IMPLANT
YANKAUER SUCT BULB TIP NO VENT (SUCTIONS) ×3 IMPLANT

## 2015-04-30 NOTE — Anesthesia Postprocedure Evaluation (Signed)
  Anesthesia Post-op Note  Patient: Edward Chen  Procedure(s) Performed: Procedure(s) (LRB): RIGHT KNEE ARTHROSCOPY (Right) CYST REMOVAL Right knee (Right) KNEE ARTHROSCOPY WITH OPEN MENISCAL REPAIR (Right)  Patient Location: PACU  Anesthesia Type: General  Level of Consciousness: awake and alert   Airway and Oxygen Therapy: Patient Spontanous Breathing  Post-op Pain: mild  Post-op Assessment: Post-op Vital signs reviewed, Patient's Cardiovascular Status Stable, Respiratory Function Stable, Patent Airway and No signs of Nausea or vomiting  Last Vitals:  Filed Vitals:   04/30/15 1041  BP: 133/83  Pulse: 111  Temp: 37.1 C  Resp: 20    Post-op Vital Signs: stable   Complications: No apparent anesthesia complications

## 2015-04-30 NOTE — Discharge Instructions (Signed)
Discharge Instructions after Knee Arthroscopy   Weight bearing as tolerated.  Do not bend knee past 90 degrees for the next 6 weeks.  You will have a light dressing on your knee.  Leave the dressing in place until the third day after your surgery and then remove it and place a band-aid over the stitches.  After the bandage has been removed you may shower, but do not soak the incision. You may begin gentle motion of your leg immediately after surgery.  Do not bend knee past 90 degrees for the next 6 weeks. Pump your foot up and down 20 times per hour, every hour you are awake.  Apply ice to the knee 3 times per day for 30 minutes for the first 1 week until your knee is feeling comfortable again. Do not use heat.  You may begin straight leg raising exercises (if you have a brace with it on). While lying down, pull your foot all the way up, tighten your quadriceps muscle and lift your heel off of the ground. Hold this position for 2 seconds, and then let the leg back down. Repeat the exercise 10 times, at least 3 times a day.  Pain medicine has been prescribed for you.  Use your medicine as needed over the first 48 hours, and then you can begin to taper your use. You may take Extra Strength Tylenol or Tylenol only in place of the pain pills.    Please call [phone] for any problems. Including the following:  - excessive redness of the incisions - drainage for more than 4 days - fever of more than 101.5 F  *Please note that pain medications will not be refilled after hours or on weekends.   Post Anesthesia Home Care Instructions  Activity: Get plenty of rest for the remainder of the day. A responsible adult should stay with you for 24 hours following the procedure.  For the next 24 hours, DO NOT: -Drive a car -Paediatric nurse -Drink alcoholic beverages -Take any medication unless instructed by your physician -Make any legal decisions or sign important papers.  Meals: Start with  liquid foods such as gelatin or soup. Progress to regular foods as tolerated. Avoid greasy, spicy, heavy foods. If nausea and/or vomiting occur, drink only clear liquids until the nausea and/or vomiting subsides. Call your physician if vomiting continues.  Special Instructions/Symptoms: Your throat may feel dry or sore from the anesthesia or the breathing tube placed in your throat during surgery. If this causes discomfort, gargle with warm salt water. The discomfort should disappear within 24 hours.  If you had a scopolamine patch placed behind your ear for the management of post- operative nausea and/or vomiting:  1. The medication in the patch is effective for 72 hours, after which it should be removed.  Wrap patch in a tissue and discard in the trash. Wash hands thoroughly with soap and water. 2. You may remove the patch earlier than 72 hours if you experience unpleasant side effects which may include dry mouth, dizziness or visual disturbances. 3. Avoid touching the patch. Wash your hands with soap and water after contact with the patch.

## 2015-04-30 NOTE — Interval H&P Note (Signed)
History and Physical Interval Note:  04/30/2015 8:03 AM  [name]  has presented today for surgery, with the diagnosis of Chondromalcia patellae, right knee, other tear of lateral meniscus, current injury, right knee, other tear of lateral meniscus, current injury, right knee Cystic meniscus, unspecified lateral  The various methods of treatment have been discussed with the patient and family. After consideration of risks, benefits and other options for treatment, the patient has consented to  Procedure(s): RIGHT KNEE ARTHROSCOPY, CHONDROPLASTY WITH LATERAL MENISECTOMY, LATERAL MENISCUS REAPIR, OPEN EXCISION LATERAL CYST (Right) as a surgical intervention .  The patient's history has been reviewed, patient examined, no change in status, stable for surgery.  I have reviewed the patient's chart and labs.  Questions were answered to the patient's satisfaction.     Edward Chen

## 2015-04-30 NOTE — H&P (View-Only) (Signed)
HPI: Edward Chen comes in with a new problem with his right knee.  He just woke up a couple of weeks ago with a pain anterolateral aspect of his right knee.  Sharp, stabbing.  Relatively constant.  Some mechanical symptoms.  Activity does not seem to make it worse.  It is when he gets off his feet that it bothers him more.  Some swelling.  No specific trauma.  He has had numerous chronic issues with his spine and is on chronic Percocet.  He does not feel like this is coming from his back however.  No numbness, no tingling distally.   History and general exam are outlined and included in the chart.  EXAMINATION: Specifically, he has some decreased cervical, thoracic, and lumbar motion.  He is neurovascularly intact.  I do not get much in the way of straight leg raising either side.  Negative log roll both sides.  The right knee is point tender lateral joint line slightly anterior.  Pain with McMurray's.  Trace effusion.  Full motion.  Stable ligaments.  Nothing on the medial side.    X-RAYS: Four view standing x-ray shows about 50% decreased joint space medial aspect right equaling left.  Nothing else acute.  IMPRESSION: I am concerned about an attritional tear anterolateral meniscus.  I am hoping this is not radicular from his back.  DISPOSITION: We are going to try a diagnostic therapeutic injection.  If this is helpful with Marcaine, but does not last then the next step would be an MRI to look at his knee.  Went over this with him and he understands.  He will let me know how he does after injection.  Obviously if we get no improvement even with Marcaine, I am going to have to look elsewhere than his knee for the source of his symptoms.    PROCEDURE: The patient's clinical condition is marked by substantial pain and/or significant functional disability. Other conservative therapy has not provided relief, is contraindicated, or not   appropriate. There is a reasonable likelihood that injection will  significantly improve the patient's pain and/or functional disability.   After appropriate consent and under sterile technique intra-articular injection, right knee, with Depo-Medrol and Marcaine.  Tolerated this well.  Ninetta Lights, M.D.  Addendum:   I saw Edward Chen in follow-up.  The MRI is complete. He has a lateral meniscus tear extending into a large lateral meniscal cyst. Since I saw him last he is getting more symptomatic with more and more localized swelling laterally, especially at the end of the day. This is very consistent with his findings. Past Medical, Family and   DISPOSITION: More than 25 minutes was spent face to face covering everything with him. The need for operative treatment is straightforward. He understands and agrees. Examination under anesthesia, arthroscopy and taking care of the meniscus. Given the size of his cyst I will do an open excision of the extra-articular cyst and then repair the margin of the lateral meniscus. I told him why that needs to be done in addition to the scope. I discussed what to expect intra-operative and post-operative. Paperwork completed.   All questions were encouraged and answered. I will see him back at the operative intervention.    Ninetta Lights, M.D.

## 2015-04-30 NOTE — Transfer of Care (Signed)
Immediate Anesthesia Transfer of Care Note  Patient: Edward Chen  Procedure(s) Performed: Procedure(s): RIGHT KNEE ARTHROSCOPY, CHONDROPLASTY WITH LATERAL MENISECTOMY, LATERAL MENISCUS REAPIR, OPEN EXCISION LATERAL CYST (Right)  Patient Location: PACU  Anesthesia Type:General  Level of Consciousness: awake, alert , oriented and patient cooperative  Airway & Oxygen Therapy: Patient Spontanous Breathing and Patient connected to face mask oxygen  Post-op Assessment: Report given to RN and Post -op Vital signs reviewed and stable  Post vital signs: Reviewed and stable  Last Vitals:  Filed Vitals:   04/30/15 1041  BP: 133/83  Pulse: 111  Temp: 37.1 C  Resp: 20    Complications: No apparent anesthesia complications

## 2015-04-30 NOTE — Anesthesia Procedure Notes (Signed)
Procedure Name: LMA Insertion Date/Time: 04/30/2015 9:20 AM Performed by: Ezinne Yogi D Pre-anesthesia Checklist: Patient identified, Emergency Drugs available, Suction available and Patient being monitored Patient Re-evaluated:Patient Re-evaluated prior to inductionOxygen Delivery Method: Circle System Utilized Preoxygenation: Pre-oxygenation with 100% oxygen Intubation Type: IV induction Ventilation: Mask ventilation without difficulty LMA: LMA inserted LMA Size: 4.0 Number of attempts: 1 Airway Equipment and Method: Bite block Placement Confirmation: positive ETCO2 Tube secured with: Tape Dental Injury: Teeth and Oropharynx as per pre-operative assessment

## 2015-04-30 NOTE — Anesthesia Preprocedure Evaluation (Signed)
Anesthesia Evaluation  Patient identified by MRN, date of birth, ID band Patient awake    Reviewed: Allergy & Precautions, H&P , NPO status , Patient's Chart, lab work & pertinent test results  History of Anesthesia Complications Negative for: history of anesthetic complications  Airway Mallampati: II  TM Distance: >3 FB Neck ROM: full    Dental no notable dental hx.    Pulmonary asthma , Current Smoker,  breath sounds clear to auscultation  Pulmonary exam normal       Cardiovascular hypertension, Pt. on medications negative cardio ROS Normal cardiovascular examRhythm:regular Rate:Normal     Neuro/Psych PSYCHIATRIC DISORDERS Anxiety negative neurological ROS     GI/Hepatic GERD-  ,(+)     substance abuse  alcohol use,   Endo/Other  negative endocrine ROS  Renal/GU negative Renal ROS     Musculoskeletal  (+) Arthritis -,   Abdominal   Peds  Hematology negative hematology ROS (+)   Anesthesia Other Findings   Reproductive/Obstetrics negative OB ROS                             Anesthesia Physical Anesthesia Plan  ASA: III  Anesthesia Plan: General   Post-op Pain Management:    Induction: Intravenous  Airway Management Planned: LMA  Additional Equipment:   Intra-op Plan:   Post-operative Plan:   Informed Consent: I have reviewed the patients History and Physical, chart, labs and discussed the procedure including the risks, benefits and alternatives for the proposed anesthesia with the patient or authorized representative who has indicated his/her understanding and acceptance.     Plan Discussed with: Anesthesiologist, CRNA and Surgeon  Anesthesia Plan Comments:         Anesthesia Quick Evaluation

## 2015-05-01 ENCOUNTER — Encounter (HOSPITAL_BASED_OUTPATIENT_CLINIC_OR_DEPARTMENT_OTHER): Payer: Self-pay | Admitting: Orthopedic Surgery

## 2015-05-01 NOTE — Op Note (Signed)
NAME:  Edward Chen, Edward Chen NO.:  [id number]  MEDICAL RECORD NO.:  [id number]  LOCATION:                               FACILITY:  [facility]  PHYSICIAN:  Ninetta Lights, M.D. DATE OF BIRTH:  1961/03/07  DATE OF PROCEDURE:  04/30/2015 DATE OF DISCHARGE:  04/30/2015                              OPERATIVE REPORT   PREOPERATIVE DIAGNOSIS:  Right knee cleavage complex tearing, middle third lateral meniscus with a large intra and extra-articular meniscal cyst.  POSTOPERATIVE DIAGNOSIS:  Right knee cleavage complex tearing, middle third lateral meniscus with a large intra and extra-articular meniscal cyst, with a cyst almost 6 cm anterior to posterior and 2 cm top to bottom.  Complete detachment of the lateral meniscus throughout the middle third as a result of the pathology.  PROCEDURES:  Right knee exam under anesthesia, arthroscopy.  Partial lateral meniscectomy.  Open completely excision of meniscal cyst. Repair of the lateral meniscus with 2.0 FiberWire PushLock anchor, reinforced with a capsular repair of Vicryl.  SURGEON:  Ninetta Lights, M.D.  ASSISTANT:  Elmyra Ricks, PA, present throughout the entire case and necessary for timely completion of procedure.  ANESTHESIA:  General.  ESTIMATED BLOOD LOSS:  Minimal.  SPECIMENS:  None.  CULTURES:  None.  COMPLICATIONS:  None.  DRESSINGS:  Soft compressive knee immobilizer.  TOURNIQUET TIME:  1 hour.  DESCRIPTION OF PROCEDURE:  The patient was brought to the operating room, placed on the operating table in supine position.  After adequate anesthesia had been obtained, tourniquet applied.  Leg holder applied. Prepped and draped in usual sterile fashion.  Exsanguinated with elevation of Esmarch.  Tourniquet inflated to 300 mmHg.  Two portals, one each medial and lateral parapatellar.  Arthroscope was introduced. Knee distended and inspected.  Articular cartilage looked great throughout.  Medial meniscus,  medial compartment cruciate ligaments patellofemoral joint looked good.  Radial cleavage tearing middle third lateral meniscus.  This was saucerized out.  This did not have an obvious extension outside the knee itself.  Instruments and fluid removed.  I then made a longitudinal incision right over the cyst in the middle third of meniscus.  Skin and subcutaneous tissue divided. Extremely large cyst that extended outside the capsule was excised in its entirety including a portion that was inside the capsule.  This then exposed the whole lateral border of the meniscus.  I felt this was still reasonable tissue quality to allow repair rather than further meniscectomy.  This was repaired with FiberWire horizontal mattress suture with the margin anchored down the tibia with a PushLock.  I then used Vicryl to reinforce and so the capsule ligamentous structure to the lateral border of meniscus reinforced, closing the iliotibial band over top of this.  Looked again arthroscopically.  [employer] manager.  Very solid meniscus anchoring throughout.  Wound irrigated.  Closed subcutaneous and subcuticular with Vicryl.  Portals were closed with nylon.  Sterile compressive dressing applied.  Anesthesia reversed. Brought to the recovery room.  Tolerated the surgery well with no complications.     Ninetta Lights, M.D.     DFM/MEDQ  D:  04/30/2015  T:  05/01/2015  Job:  [id number] 

## 2015-05-12 DIAGNOSIS — S83281D Other tear of lateral meniscus, current injury, right knee, subsequent encounter: Secondary | ICD-10-CM | POA: Diagnosis not present

## 2015-05-17 ENCOUNTER — Other Ambulatory Visit: Payer: Self-pay | Admitting: Family Medicine

## 2015-05-19 ENCOUNTER — Other Ambulatory Visit: Payer: Self-pay

## 2015-05-19 MED ORDER — LOSARTAN POTASSIUM 100 MG PO TABS: ORAL_TABLET | ORAL | Status: DC

## 2015-05-19 NOTE — Telephone Encounter (Signed)
Pt left v/m requesting refill losartan to [facility];rx last refilled # 30 x 1 on 07/11/2014; last annual 09/15/2014.no future appt scheduled. Refilled # 30 with note for pt to call for appt per protocol.

## 2015-05-22 ENCOUNTER — Other Ambulatory Visit: Payer: Self-pay | Admitting: *Deleted

## 2015-05-29 DIAGNOSIS — S83281D Other tear of lateral meniscus, current injury, right knee, subsequent encounter: Secondary | ICD-10-CM | POA: Diagnosis not present

## 2015-06-12 DIAGNOSIS — K5900 Constipation, unspecified: Secondary | ICD-10-CM | POA: Diagnosis not present

## 2015-06-12 DIAGNOSIS — R0789 Other chest pain: Secondary | ICD-10-CM | POA: Diagnosis not present

## 2015-06-12 DIAGNOSIS — Z79891 Long term (current) use of opiate analgesic: Secondary | ICD-10-CM | POA: Diagnosis not present

## 2015-06-12 DIAGNOSIS — G894 Chronic pain syndrome: Secondary | ICD-10-CM | POA: Diagnosis not present

## 2015-06-12 DIAGNOSIS — F329 Major depressive disorder, single episode, unspecified: Secondary | ICD-10-CM | POA: Diagnosis not present

## 2015-06-16 ENCOUNTER — Other Ambulatory Visit: Payer: Self-pay | Admitting: Family Medicine

## 2015-06-17 ENCOUNTER — Other Ambulatory Visit: Payer: Self-pay | Admitting: Family Medicine

## 2015-06-18 NOTE — Telephone Encounter (Signed)
Rx called in as directed.   

## 2015-06-18 NOTE — Telephone Encounter (Signed)
Ok to refill 

## 2015-06-18 NOTE — Telephone Encounter (Signed)
plz phone in. 

## 2015-08-11 DIAGNOSIS — S83281D Other tear of lateral meniscus, current injury, right knee, subsequent encounter: Secondary | ICD-10-CM | POA: Diagnosis not present

## 2015-08-12 DIAGNOSIS — K5900 Constipation, unspecified: Secondary | ICD-10-CM | POA: Diagnosis not present

## 2015-08-12 DIAGNOSIS — R0789 Other chest pain: Secondary | ICD-10-CM | POA: Diagnosis not present

## 2015-08-12 DIAGNOSIS — G894 Chronic pain syndrome: Secondary | ICD-10-CM | POA: Diagnosis not present

## 2015-08-12 DIAGNOSIS — F329 Major depressive disorder, single episode, unspecified: Secondary | ICD-10-CM | POA: Diagnosis not present

## 2015-09-23 ENCOUNTER — Other Ambulatory Visit: Payer: Self-pay | Admitting: Family Medicine

## 2015-09-29 ENCOUNTER — Telehealth: Payer: Self-pay | Admitting: Pulmonary Disease

## 2015-09-29 NOTE — Telephone Encounter (Signed)
Pt has not been seen since 06/2013.  Pt wants to come back and have SN as his PCP. He reports he was seeing Dr. Leo Grosser and does not like him nor the office out there. Please advise SN thanks

## 2015-09-29 NOTE — Telephone Encounter (Signed)
Per SN: please explain to patient that I am part-time.  It is my understanding that I can see him but the copay is higher and that he will likely need a new PCP in the near future.

## 2015-09-29 NOTE — Telephone Encounter (Signed)
Spoke with pt, states he would like to stay within our practice.  I advised that we are only pulmonary, but there is a primary care office on the main floor in our building.  Gave pt number to that office to establish care.  Nothing further needed at this time.

## 2015-10-05 ENCOUNTER — Telehealth: Payer: Self-pay | Admitting: Internal Medicine

## 2015-10-05 NOTE — Telephone Encounter (Signed)
Pt called request to be new pt of Dr. Jenny Reichmann, pt stated he used to see Dr. Lenna Gilford for over 20 years and recommended from Dr. Jeannine Kitten offer. Please advise, pt stated he no longer see Dr. Danise Mina.

## 2015-10-06 NOTE — Telephone Encounter (Signed)
Dr. Danise Mina please advise.

## 2015-10-06 NOTE — Telephone Encounter (Signed)
Ok to do. Thanks.  

## 2015-10-06 NOTE — Telephone Encounter (Signed)
[place] with me, but must be ok with current PCP

## 2015-10-12 DIAGNOSIS — R0789 Other chest pain: Secondary | ICD-10-CM | POA: Diagnosis not present

## 2015-10-12 DIAGNOSIS — Z79899 Other long term (current) drug therapy: Secondary | ICD-10-CM | POA: Diagnosis not present

## 2015-10-12 DIAGNOSIS — G894 Chronic pain syndrome: Secondary | ICD-10-CM | POA: Diagnosis not present

## 2015-10-12 DIAGNOSIS — K5900 Constipation, unspecified: Secondary | ICD-10-CM | POA: Diagnosis not present

## 2015-10-12 DIAGNOSIS — Z79891 Long term (current) use of opiate analgesic: Secondary | ICD-10-CM | POA: Diagnosis not present

## 2015-11-09 ENCOUNTER — Telehealth: Payer: Self-pay

## 2015-11-09 NOTE — Telephone Encounter (Signed)
Unable to reach pt by phone.

## 2015-11-09 NOTE — Telephone Encounter (Signed)
PLEASE NOTE: All timestamps contained within this report are represented as [place] Standard Time. CONFIDENTIALTY NOTICE: This fax transmission is intended only for the addressee. It contains information that is legally privileged, confidential or otherwise protected from use or disclosure. If you are not the intended recipient, you are strictly prohibited from reviewing, disclosing, copying using or disseminating any of this information or taking any action in reliance on or regarding this information. If you have received this fax in error, please notify us immediately by telephone so that we can arrange for its return to [place]. Phone: [phone], Toll-Free: [phone], Fax: [phone] Page: 1 of 2 Call Id: [id number] [place] Patient Name: Edward Chen Gender: Male DOB: [birth year] Age: 55 Y 9 M 17 D Return Phone Number: [phone] (Primary) [place] [address] Client [place] Night - Client Client Site [facility] Physician Ria Bush Contact Type Call Who Is Calling Patient / Member / Family / Caregiver Call Type Triage / Clinical Caller Name Edward Chen Relationship To Patient Spouse Return Phone Number [phone] (Primary) Chief Complaint CHEST PAIN (>=21 years) - pain, pressure, heaviness or tightness Reason for Call Symptomatic / Request for [place] states husband has a chest cold, has HX of asthma, his chest feels tight and coughing GOTO Facility Not Listed local [facility] PreDisposition Call Doctor Translation No Nurse Assessment Nurse: Leilani Merl, RN, Heather Date/Time (Eastern Time): 11/08/2015 11:19:41 AM Confirm and document reason for call. If symptomatic, describe symptoms. You must click the next button to save text entered. ---Caller states husband  has a chest cold, has HX of asthma, his chest feels tight and coughing, this started yesterday Has the patient traveled out of the country within the last 30 days? ---Not Applicable Does the patient have any new or worsening symptoms? ---Yes Will a triage be completed? ---Yes Related visit to physician within the last 2 weeks? ---No Does the PT have any chronic conditions? (i.e. diabetes, asthma, etc.) ---Yes List chronic conditions. ---asthma, back problems Is this a behavioral health or substance abuse call? ---No Guidelines Guideline Title Affirmed Question Affirmed Notes Nurse Date/Time (Eastern Time) Cough - Acute Non- Productive SEVERE coughing spells (e.g., whooping sound after coughing, vomiting after coughing) Standifer, RN, Heather 11/08/2015 11:20:31 AM PLEASE NOTE: All timestamps contained within this report are represented as [place] Standard Time. CONFIDENTIALTY NOTICE: This fax transmission is intended only for the addressee. It contains information that is legally privileged, confidential or otherwise protected from use or disclosure. If you are not the intended recipient, you are strictly prohibited from reviewing, disclosing, copying using or disseminating any of this information or taking any action in reliance on or regarding this information. If you have received this fax in error, please notify us immediately by telephone so that we can arrange for its return to [place]. Phone: [phone], Toll-Free: [phone], Fax: [phone] Page: 2 of 2 Call Id: [id number] [place]. Time Eilene Ghazi Time) Disposition Final User 11/08/2015 11:16:48 AM Send to Urgent Toni Arthurs 11/08/2015 11:34:57 AM Call Completed Standifer, RN, Nira Conn 11/08/2015 11:33:32 AM See Physician within 24 Hours Yes Standifer, RN, Soyla Murphy Understands: Yes Disagree/Comply: Comply Care Advice Given Per Guideline SEE PHYSICIAN WITHIN 24 HOURS: * IF OFFICE WILL BE OPEN: You need to be seen  within the next 24 hours. Call your doctor when the office opens, and make an appointment. CALL BACK  IF: * You become worse. CARE ADVICE given per Cough - Acute Non-Productive (Adult) guideline. Referrals GO TO FACILITY OTHER - SPECIFY

## 2015-11-09 NOTE — Telephone Encounter (Signed)
We were unable to reach patient by phone today. CAN recommended eval for chest congestion.  Pt does not want to return to our office. Will route to new PCP.

## 2015-11-11 ENCOUNTER — Telehealth: Payer: Self-pay | Admitting: Pulmonary Disease

## 2015-11-11 NOTE — Telephone Encounter (Signed)
Spoke with pt, requesting a zpak for stuffy nose, pnd, prod cough with yellow mucus X4-5 days.  Denies fever, chills, body aches, chest tightness/pain.  I advised pt that he has not been seen since 2014 in our office and would need to call his current PCP office.  Pt states he hasn't seen his old PCP in over a year, and is establishing with Dr. Jenny Reichmann downstairs on Monday.  Pt began to become upset and requested I send the message to SN to see if he could help him "while in between doctors".   [facility] [place]  SN please advise.  Thanks.

## 2015-11-11 NOTE — Telephone Encounter (Signed)
Per SN:  Ok for Zpak. Keep appt with Dr. Jenny Reichmann to establish care.

## 2015-11-11 NOTE — Telephone Encounter (Signed)
lmtcb x1 for pt. 

## 2015-11-12 MED ORDER — AZITHROMYCIN 250 MG PO TABS: ORAL_TABLET | ORAL | Status: DC

## 2015-11-12 NOTE — Telephone Encounter (Signed)
Pt is aware of SN's recommendations. Rx has been sent in. Nothing further was needed. 

## 2015-11-13 NOTE — Telephone Encounter (Signed)
Needs OV.  

## 2015-11-16 ENCOUNTER — Encounter: Payer: Self-pay | Admitting: Internal Medicine

## 2015-11-16 ENCOUNTER — Ambulatory Visit (INDEPENDENT_AMBULATORY_CARE_PROVIDER_SITE_OTHER): Payer: Medicare Other | Admitting: Internal Medicine

## 2015-11-16 ENCOUNTER — Other Ambulatory Visit: Payer: Self-pay | Admitting: Internal Medicine

## 2015-11-16 ENCOUNTER — Other Ambulatory Visit (INDEPENDENT_AMBULATORY_CARE_PROVIDER_SITE_OTHER): Payer: Medicare Other

## 2015-11-16 VITALS — BP 136/80 | HR 88 | Temp 98.0°F | Resp 20 | Wt 171.0 lb

## 2015-11-16 DIAGNOSIS — Z0001 Encounter for general adult medical examination with abnormal findings: Secondary | ICD-10-CM | POA: Insufficient documentation

## 2015-11-16 DIAGNOSIS — I10 Essential (primary) hypertension: Secondary | ICD-10-CM | POA: Diagnosis not present

## 2015-11-16 DIAGNOSIS — J45909 Unspecified asthma, uncomplicated: Secondary | ICD-10-CM

## 2015-11-16 DIAGNOSIS — R7989 Other specified abnormal findings of blood chemistry: Secondary | ICD-10-CM

## 2015-11-16 DIAGNOSIS — G894 Chronic pain syndrome: Secondary | ICD-10-CM | POA: Diagnosis not present

## 2015-11-16 DIAGNOSIS — M5090 Cervical disc disorder, unspecified, unspecified cervical region: Secondary | ICD-10-CM

## 2015-11-16 DIAGNOSIS — J4530 Mild persistent asthma, uncomplicated: Secondary | ICD-10-CM

## 2015-11-16 DIAGNOSIS — F411 Generalized anxiety disorder: Secondary | ICD-10-CM

## 2015-11-16 DIAGNOSIS — Z0000 Encounter for general adult medical examination without abnormal findings: Secondary | ICD-10-CM

## 2015-11-16 DIAGNOSIS — M519 Unspecified thoracic, thoracolumbar and lumbosacral intervertebral disc disorder: Secondary | ICD-10-CM | POA: Insufficient documentation

## 2015-11-16 HISTORY — DX: Unspecified asthma, uncomplicated: J45.909

## 2015-11-16 HISTORY — DX: Unspecified thoracic, thoracolumbar and lumbosacral intervertebral disc disorder: M51.9

## 2015-11-16 HISTORY — DX: Cervical disc disorder, unspecified, unspecified cervical region: M50.90

## 2015-11-16 LAB — CBC WITH DIFFERENTIAL/PLATELET
BASOS ABS: 0 10*3/uL (ref 0.0–0.1)
Basophils Relative: 0.6 % (ref 0.0–3.0)
Eosinophils Absolute: 0.2 10*3/uL (ref 0.0–0.7)
Eosinophils Relative: 2.2 % (ref 0.0–5.0)
HEMATOCRIT: 41.9 % (ref 39.0–52.0)
HEMOGLOBIN: 14.9 g/dL (ref 13.0–17.0)
LYMPHS PCT: 23.9 % (ref 12.0–46.0)
Lymphs Abs: 1.9 10*3/uL (ref 0.7–4.0)
MCHC: 35.4 g/dL (ref 30.0–36.0)
MCV: 88.8 fl (ref 78.0–100.0)
MONOS PCT: 6.2 % (ref 3.0–12.0)
Monocytes Absolute: 0.5 10*3/uL (ref 0.1–1.0)
Neutro Abs: 5.3 10*3/uL (ref 1.4–7.7)
Neutrophils Relative %: 67.1 % (ref 43.0–77.0)
Platelets: 182 10*3/uL (ref 150.0–400.0)
RBC: 4.72 Mil/uL (ref 4.22–5.81)
RDW: 12.3 % (ref 11.5–15.5)
WBC: 7.9 10*3/uL (ref 4.0–10.5)

## 2015-11-16 LAB — URINALYSIS, ROUTINE W REFLEX MICROSCOPIC
Hgb urine dipstick: NEGATIVE
Ketones, ur: NEGATIVE
Leukocytes, UA: NEGATIVE
NITRITE: NEGATIVE
PH: 5.5 (ref 5.0–8.0)
RBC / HPF: NONE SEEN (ref 0–?)
Specific Gravity, Urine: >=1.03 — AB (ref 1.000–1.030)
TOTAL PROTEIN, URINE-UPE24: 30 — AB
UROBILINOGEN UA: 1 (ref 0.0–1.0)
Urine Glucose: NEGATIVE

## 2015-11-16 LAB — HEPATIC FUNCTION PANEL
ALBUMIN: 4.8 g/dL (ref 3.5–5.2)
ALT: 43 U/L (ref 0–53)
AST: 24 U/L (ref 0–37)
Alkaline Phosphatase: 67 U/L (ref 39–117)
Bilirubin, Direct: 0.1 mg/dL (ref 0.0–0.3)
TOTAL PROTEIN: 7.5 g/dL (ref 6.0–8.3)
Total Bilirubin: 0.8 mg/dL (ref 0.2–1.2)

## 2015-11-16 LAB — LIPID PANEL
CHOL/HDL RATIO: 6
CHOLESTEROL: 151 mg/dL (ref 0–200)
HDL: 26.9 mg/dL — ABNORMAL LOW (ref >39.00)

## 2015-11-16 LAB — BASIC METABOLIC PANEL
BUN: 18 mg/dL (ref 6–23)
CALCIUM: 9.8 mg/dL (ref 8.4–10.5)
CO2: 28 meq/L (ref 19–32)
CREATININE: 1.24 mg/dL (ref 0.40–1.50)
Chloride: 104 mEq/L (ref 96–112)
GFR: 63.9 mL/min (ref >60.00)
GLUCOSE: 117 mg/dL — AB (ref 70–99)
Potassium: 4.5 mEq/L (ref 3.5–5.1)
Sodium: 141 mEq/L (ref 135–145)

## 2015-11-16 LAB — PSA: PSA: 0.51 ng/mL (ref 0.10–4.00)

## 2015-11-16 LAB — TSH: TSH: 1.62 u[IU]/mL (ref 0.35–4.50)

## 2015-11-16 LAB — LDL CHOLESTEROL, DIRECT: Direct LDL: 64 mg/dL

## 2015-11-16 MED ORDER — AZELASTINE HCL 0.05 % OP SOLN
1.0000 [drp] | Freq: Two times a day (BID) | OPHTHALMIC | Status: DC
Start: 2015-11-16 — End: 2016-11-16

## 2015-11-16 MED ORDER — LOSARTAN POTASSIUM 100 MG PO TABS: 100.0000 mg | ORAL_TABLET | Freq: Every day | ORAL | Status: DC

## 2015-11-16 MED ORDER — BUDESONIDE-FORMOTEROL FUMARATE 160-4.5 MCG/ACT IN AERO: 2.0000 | INHALATION_SPRAY | Freq: Two times a day (BID) | RESPIRATORY_TRACT | Status: DC

## 2015-11-16 MED ORDER — FENOFIBRATE 160 MG PO TABS: ORAL_TABLET | ORAL | Status: DC

## 2015-11-16 NOTE — Patient Instructions (Addendum)
Your EKG was OK today  Please take all new medication as prescribed - the optivar for the eyes  Please continue all other medications as before, and refills have been done if requested - the losartan, symbicort and fenofibrate  Please have the pharmacy call with any other refills you may need.  Please continue your efforts at being more active, low cholesterol diet, and weight control.  You are otherwise up to date with prevention measures today.  Please keep your appointments with your specialists as you may have planned  Please go to the LAB in the Basement (turn left off the elevator) for the tests to be done today  You will be contacted by phone if any changes need to be made immediately.  Otherwise, you will receive a letter about your results with an explanation, but please check with MyChart first.  Please remember to sign up for MyChart if you have not done so, as this will be important to you in the future with finding out test results, communicating by private email, and scheduling acute appointments online when needed.  Please return in 6 months, or sooner if needed

## 2015-11-16 NOTE — Assessment & Plan Note (Signed)
stable overall by history and exam, recent data reviewed with pt, and pt to continue medical treatment as before,  to f/u any worsening symptoms or concerns SpO2 Readings from Last 3 Encounters:  11/16/15 97%  04/30/15 99%  03/03/14 97%

## 2015-11-16 NOTE — Progress Notes (Signed)
Pre visit review using our clinic review tool, if applicable. No additional management support is needed unless otherwise documented below in the visit note. 

## 2015-11-16 NOTE — Assessment & Plan Note (Signed)
Overall stable, for forms to be filled out as well for disability as before

## 2015-11-16 NOTE — Assessment & Plan Note (Signed)
stable overall by history and exam, recent data reviewed with pt, and pt to continue medical treatment as before,  to f/u any worsening symptoms or concerns BP Readings from Last 3 Encounters:  11/16/15 136/80  04/30/15 123/80  10/02/14 126/78

## 2015-11-16 NOTE — Assessment & Plan Note (Signed)
Mod nervous today,o/w stable overall by history and exam, recent data reviewed with pt, and pt to continue medical treatment as before,  to f/u any worsening symptoms or concerns

## 2015-11-16 NOTE — Progress Notes (Signed)
Subjective:    Patient ID: Edward Horns., male    DOB: Jun 23, 1961, 55 y.o.   MRN: [id number]  HPI  Here for wellness and f/u as new pt to me;  Overall doing ok;  Pt denies Chest pain, worsening SOB, DOE, wheezing, orthopnea, PND, worsening LE edema, palpitations, dizziness or syncope.  Pt denies neurological change such as new headache, facial or extremity weakness.  Pt denies polydipsia, polyuria, or low sugar symptoms. Pt states overall good compliance with treatment and medications, good tolerability, and has been trying to follow appropriate diet.  Pt denies worsening depressive symptoms, suicidal ideation or panic. No fever, night sweats, wt loss, loss of appetite, or other constitutional symptoms.  Pt states good ability with ADL's, has low fall risk, home safety reviewed and adequate, no other significant changes in hearing or vision, and only occasionally active with exercise.  On permament disability, has chronic CP sees [facility], has ongoing issues with anxiety.  Currently with allergies acting up - Does have several wks ongoing nasal allergy symptoms with clearish congestion, itch and sneezing, without fever, pain, ST, cough, swelling or wheezing. Takes OTC zyrtec.  Did do better with zpack tx per Dr Lenna Gilford last wk.  Needs disability form filled out, due every 5 yrs or so.  Has ongoing lower back and neck pain, bilat shoudler pain with rot cuff surgury x 2 each side, and right knee pain s/p arthroscopic, has DJD on left as well. Also with chronic chest pain syndrome since 2005 with neg stress test about that time. Gets some pain with overeating, has seen Dr Deatra Ina in past for GI.  Past Medical History  Diagnosis Date  . Acute asthmatic bronchitis   . Benign hypertension   . Palpitations   . Hyperlipidemia   . Borderline diabetes mellitus   . GERD (gastroesophageal reflux disease)   . Gastroparesis   . DJD (degenerative joint disease)   . Chronic pain syndrome    [facility] - percocets 7.5mg   . Anxiety     with h/o attacks "on xanax for chest pain"  . Allergic rhinitis   . Allergy   . Depression   . Abnormal drug screen 09/2014    neg xanax (but PRN use), pos EtOH  . Habitual alcohol use    Past Surgical History  Procedure Laterality Date  . Shoulder surgery Right 11/05    Arthroscopy, debridement, distal resection  . Cervical discectomy  4/06    decompression, foraminotomy, and fusion Dr. Joya Salm   . Shoulder surgery Left 05/2011    Dr. Percell Miller  . Upper gastrointestinal endoscopy      X3  . Colonoscopy  11/2012    1 TA, mod diverticulosis, rpt 5 yrs Deatra Ina)  . Esophagogastroduodenoscopy  02/2014    gastric polyps, retained gastric residue s/p dilatation Deatra Ina)  . Appendectomy    . Knee arthroscopy with lateral menisectomy Right 04/30/2015    Procedure: RIGHT KNEE ARTHROSCOPY;  Surgeon: Ninetta Lights, MD;  Location: [facility];  Service: Orthopedics;  Laterality: Right;  . Ear cyst excision Right 04/30/2015    Procedure: CYST REMOVAL Right knee;  Surgeon: Ninetta Lights, MD;  Location: [facility];  Service: Orthopedics;  Laterality: Right;  . Knee arthroscopy with meniscal repair Right 04/30/2015    Procedure: KNEE ARTHROSCOPY WITH OPEN MENISCAL REPAIR;  Surgeon: Ninetta Lights, MD;  Location: [facility];  Service: Orthopedics;  Laterality: Right;  reports that he has been smoking Cigarettes.  He has smoked for the past 20 years. He has never used smokeless tobacco. He reports that he drinks about 7.2 oz of alcohol per week. He reports that he does not use illicit drugs. family history includes Lung disease in his father. There is no history of Cancer, CAD, Stroke, Diabetes, Colon cancer, Esophageal cancer, Stomach cancer, or Rectal cancer. Allergies  Allergen Reactions  . Other Anaphylaxis    BLACKBERRIES  . Peanuts [Peanut Oil] Anaphylaxis  . Shellfish Allergy Anaphylaxis  .  Betadine [Povidone Iodine] Other (See Comments)    Due to shellfish allergy   . Nexium [Esomeprazole Magnesium] Other (See Comments)    Chest pain, does ok with prilosec   Current Outpatient Prescriptions on File Prior to Visit  Medication Sig Dispense Refill  . ALPRAZolam (XANAX) 0.5 MG tablet TAKE 1 TABLET BY MOUTH 3 TIMES DAILY AS NEEDED 90 tablet 0  . azithromycin (ZITHROMAX Z-PAK) 250 MG tablet Take 2 tablets (500 mg) on  Day 1,  followed by 1 tablet (250 mg) once daily on Days 2 through 5. 6 each 0  . baclofen (LIORESAL) 10 MG tablet Take 10 mg by mouth as needed for muscle spasms.    Marland Kitchen buPROPion (WELLBUTRIN) 100 MG tablet Take 100 mg by mouth 2 (two) times daily.    . fenofibrate 160 MG tablet TAKE 1 TABLET BY MOUTH EVERY DAY 30 tablet 11  . fenofibrate 160 MG tablet TAKE 1 TABLET BY MOUTH EVERY DAY 30 tablet 3  . hyoscyamine (LEVBID) 0.375 MG 12 hr tablet TAKE ONE TABLET TWICE A DAY FOR ABDOMINAL PAIN AS NEEDED 60 tablet 1  . losartan (COZAAR) 100 MG tablet TAKE 1 TABLET BY MOUTH EVERY DAY 30 tablet 3  . omeprazole (PRILOSEC) 20 MG capsule Take 1 capsule (20 mg total) by mouth daily. 90 capsule 3  . oxyCODONE-acetaminophen (PERCOCET) 7.5-325 MG per tablet Take as directed by pain management      No current facility-administered medications on file prior to visit.   Review of Systems  Constitutional: Negative for increased diaphoresis, other activity, appetite or siginficant weight change other than noted HENT: Negative for worsening hearing loss, ear pain, facial swelling, mouth sores and neck stiffness.   Eyes: Negative for other worsening pain, redness or visual disturbance.  Respiratory: Negative for shortness of breath and wheezing  Cardiovascular: Negative for chest pain and palpitations.  Gastrointestinal: Negative for diarrhea, blood in stool, abdominal distention or other pain Genitourinary: Negative for hematuria, flank pain or change in urine volume.  Musculoskeletal:  Negative for myalgias or other joint complaints.  Skin: Negative for color change and wound or drainage.  Neurological: Negative for syncope and numbness. other than noted Hematological: Negative for adenopathy. or other swelling Psychiatric/Behavioral: Negative for hallucinations, SI, self-injury, decreased concentration or other worsening agitation.      Objective:   Physical Exam BP 136/80 mmHg  Pulse 88  Temp(Src) 98 F (36.7 C) (Oral)  Resp 20  Wt 171 lb (77.565 kg)  SpO2 97% VS noted,  Constitutional: Pt is oriented to person, place, and time. Appears well-developed and well-nourished, in no significant distress Head: Normocephalic and atraumatic.  Right Ear: External ear normal.  Left Ear: External ear normal.  Nose: Nose normal.  Mouth/Throat: Oropharynx is clear and moist.  Eyes: Conjunctivae and EOM are normal. Pupils are equal, round, and reactive to light.  Neck: Normal range of motion. Neck supple. No JVD present. No tracheal deviation  present or significant neck LA or mass Cardiovascular: Normal rate, regular rhythm, normal heart sounds and intact distal pulses.   Pulmonary/Chest: Effort normal and breath sounds without rales or wheezing  Abdominal: Soft. Bowel sounds are normal. NT. No HSM  Musculoskeletal: Normal range of motion. Exhibits no edema.  Lymphadenopathy:  Has no cervical adenopathy.  Neurological: Pt is alert and oriented to person, place, and time. Pt has normal reflexes. No cranial nerve deficit. Motor grossly intact Skin: Skin is warm and dry. No rash noted.  Psychiatric:  Has normal mood and affect. Behavior is normal.     Assessment & Plan:

## 2015-11-16 NOTE — Assessment & Plan Note (Signed)

## 2015-11-17 ENCOUNTER — Telehealth: Payer: Self-pay

## 2015-11-17 LAB — HEPATITIS C ANTIBODY: HCV AB: NEGATIVE

## 2015-11-17 NOTE — Telephone Encounter (Signed)
Received disability form from [place].  Attached was the previous years form from old PCP Teressa Lower, MD).   Sent note and form to Dr. Jenny Reichmann (current PCP) for advise on whether he will sign the form.

## 2015-11-19 NOTE — Telephone Encounter (Signed)
Pt called in about these papers.

## 2015-11-20 NOTE — Telephone Encounter (Signed)
[place] for steph to help fill out

## 2015-11-23 NOTE — Telephone Encounter (Signed)
Paperwork signed, faxed, copy sent to scan. Pt advised of same via personal VM

## 2015-11-23 NOTE — Telephone Encounter (Signed)
Paperwork completed and placed on MD's desk for signature 

## 2015-12-03 ENCOUNTER — Other Ambulatory Visit: Payer: Self-pay | Admitting: Gastroenterology

## 2015-12-10 DIAGNOSIS — K5900 Constipation, unspecified: Secondary | ICD-10-CM | POA: Diagnosis not present

## 2015-12-10 DIAGNOSIS — R0789 Other chest pain: Secondary | ICD-10-CM | POA: Diagnosis not present

## 2015-12-10 DIAGNOSIS — G894 Chronic pain syndrome: Secondary | ICD-10-CM | POA: Diagnosis not present

## 2016-01-21 ENCOUNTER — Telehealth: Payer: Self-pay | Admitting: Gastroenterology

## 2016-01-21 NOTE — Telephone Encounter (Signed)
Prior Edward Chen pt, states his wife sees Dr. Henrene Pastor. Pt would like to become Dr. Blanch Media pt. Will you accept the pt? Pt also needs refill on omeprazole. Please advise.

## 2016-01-21 NOTE — Telephone Encounter (Signed)
I would be happy to see him. Thank you

## 2016-01-21 NOTE — Telephone Encounter (Signed)
He states he is not having any issues at this time but needs refill on omeprazole, ok to refill?

## 2016-01-22 MED ORDER — OMEPRAZOLE 20 MG PO CPDR: 20.0000 mg | DELAYED_RELEASE_CAPSULE | Freq: Every day | ORAL | Status: DC

## 2016-01-22 NOTE — Telephone Encounter (Signed)
Pt aware, script sent to pharmacy. 

## 2016-01-22 NOTE — Telephone Encounter (Signed)
Okay to refill.  Thanks.

## 2016-02-09 DIAGNOSIS — R0789 Other chest pain: Secondary | ICD-10-CM | POA: Diagnosis not present

## 2016-02-09 DIAGNOSIS — Z79891 Long term (current) use of opiate analgesic: Secondary | ICD-10-CM | POA: Diagnosis not present

## 2016-02-09 DIAGNOSIS — G894 Chronic pain syndrome: Secondary | ICD-10-CM | POA: Diagnosis not present

## 2016-02-09 DIAGNOSIS — K5900 Constipation, unspecified: Secondary | ICD-10-CM | POA: Diagnosis not present

## 2016-03-22 DIAGNOSIS — M170 Bilateral primary osteoarthritis of knee: Secondary | ICD-10-CM | POA: Diagnosis not present

## 2016-04-13 DIAGNOSIS — R0789 Other chest pain: Secondary | ICD-10-CM | POA: Diagnosis not present

## 2016-04-13 DIAGNOSIS — G894 Chronic pain syndrome: Secondary | ICD-10-CM | POA: Diagnosis not present

## 2016-04-13 DIAGNOSIS — K5900 Constipation, unspecified: Secondary | ICD-10-CM | POA: Diagnosis not present

## 2016-04-16 DIAGNOSIS — M25561 Pain in right knee: Secondary | ICD-10-CM | POA: Diagnosis not present

## 2016-04-16 DIAGNOSIS — M25562 Pain in left knee: Secondary | ICD-10-CM | POA: Diagnosis not present

## 2016-04-19 DIAGNOSIS — M170 Bilateral primary osteoarthritis of knee: Secondary | ICD-10-CM | POA: Diagnosis not present

## 2016-04-26 DIAGNOSIS — M170 Bilateral primary osteoarthritis of knee: Secondary | ICD-10-CM | POA: Diagnosis not present

## 2016-09-10 ENCOUNTER — Other Ambulatory Visit: Payer: Self-pay | Admitting: Internal Medicine

## 2016-09-10 NOTE — Telephone Encounter (Signed)
faxed

## 2016-09-10 NOTE — Telephone Encounter (Signed)
Done hardcopy to Corinne  

## 2016-09-15 IMAGING — MR MR KNEE*R* W/O CM
4 of 5 series · 19 of 40 positions shown · non-contrast
Comparison: None.

CLINICAL DATA: Right knee pain for 6-8 months.

EXAM:
MRI OF THE RIGHT KNEE WITHOUT CONTRAST
TECHNIQUE: Multiplanar, multisequence MR imaging of the knee was performed. No
intravenous contrast was administered.

[Series 3: pd_tse_fs_tra · axial · 3.5mm · 0.39mm/px · z∈[-24,+34]mm · 3 of 23 slices shown]
[im 4/23]
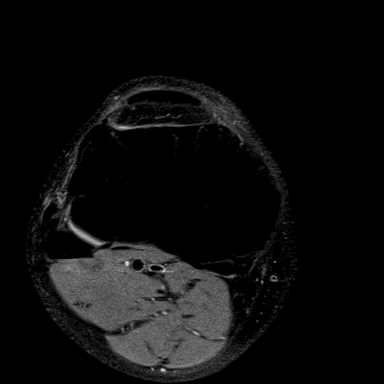
[im 12/23]
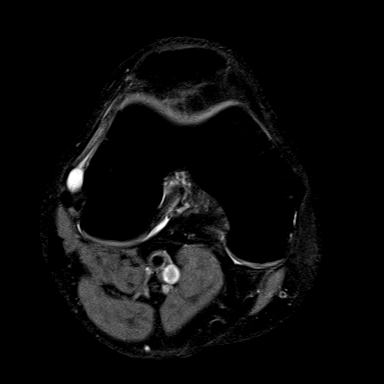
[im 19/23]
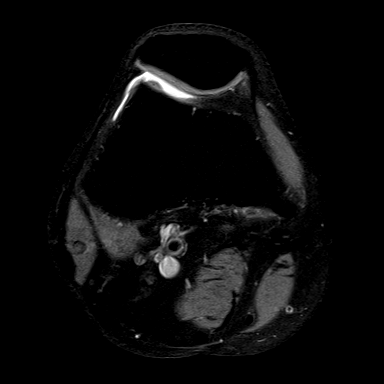

[Series 4: PD fat-sat · sagittal · 4.0mm · 0.31mm/px · 6 of 20 slices shown]
[im 1/20]
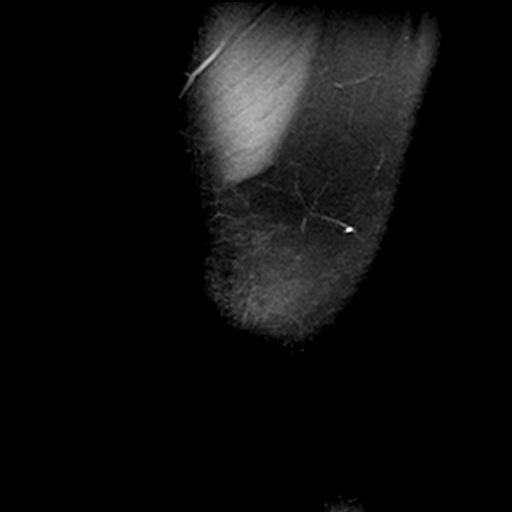
[im 4/20]
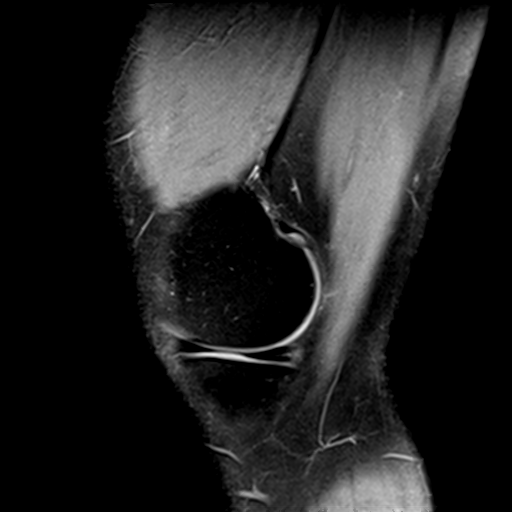
[im 8/20]
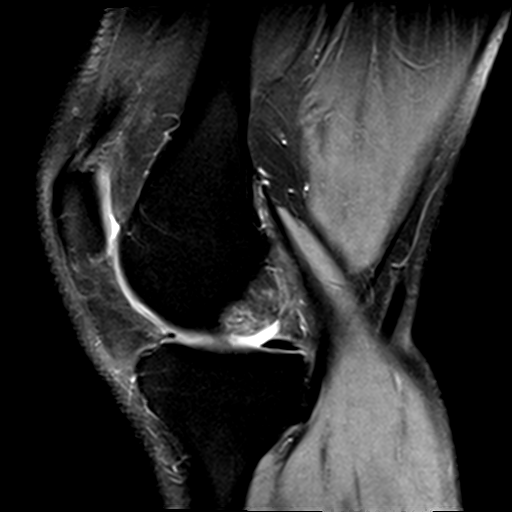
[im 12/20]
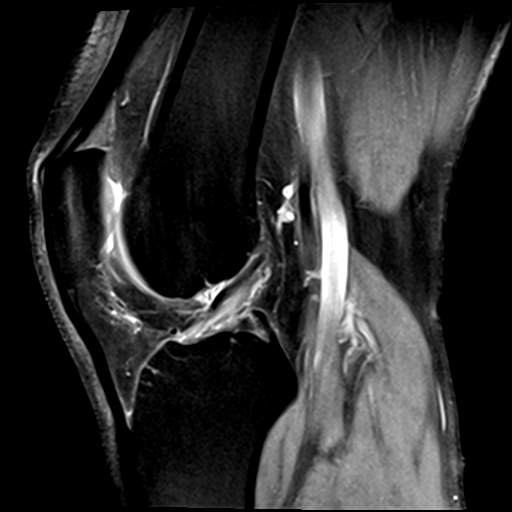
[im 16/20]
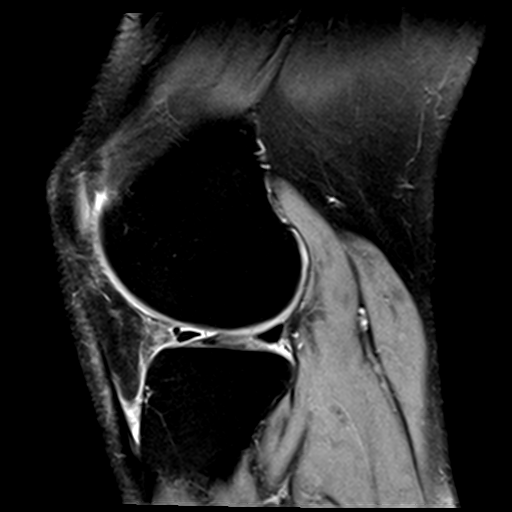
[im 20/20]
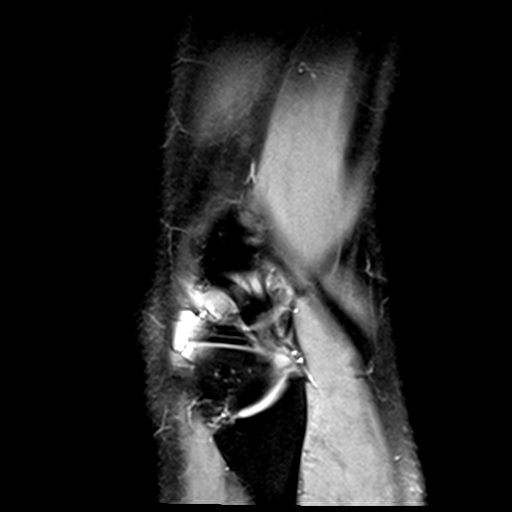

[Series 6: T2 fat-sat · coronal · 3.0mm · 0.29mm/px · 7 of 26 slices shown]
[im 1/26]
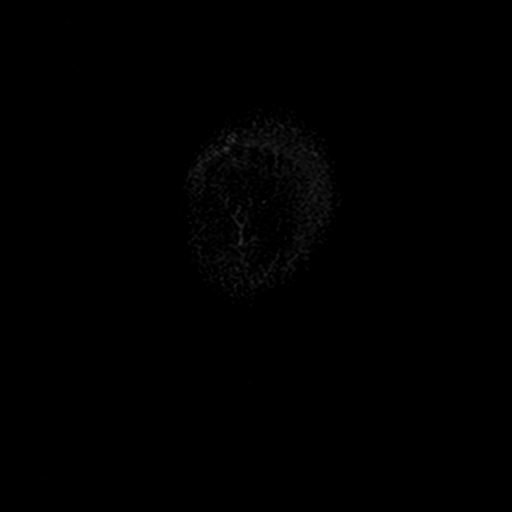
[im 4/26]
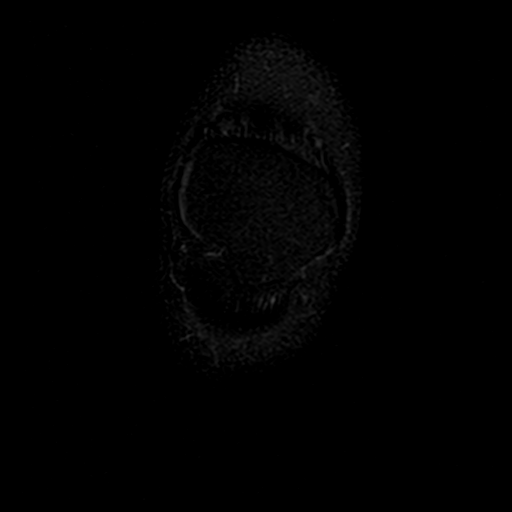
[im 7/26]
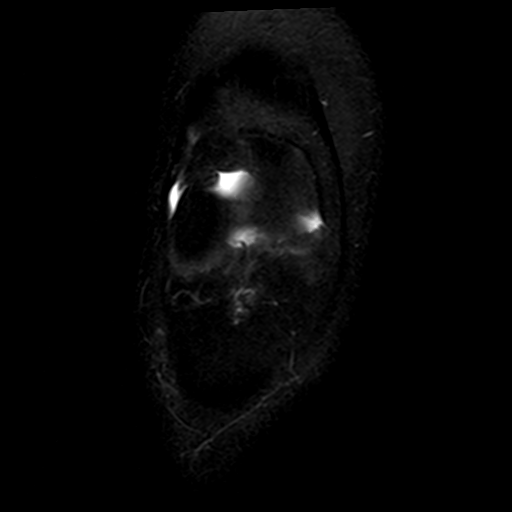
[im 10/26]
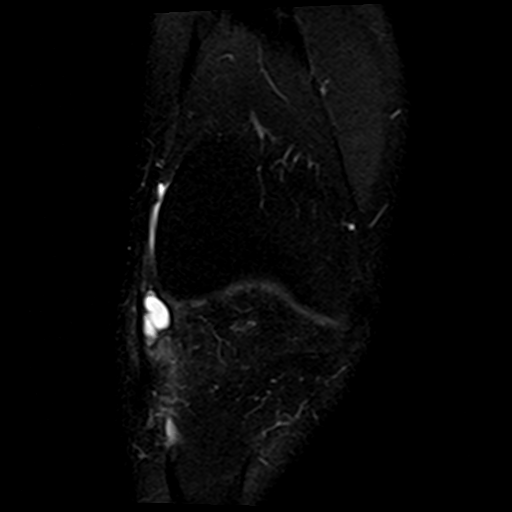
[im 13/26]
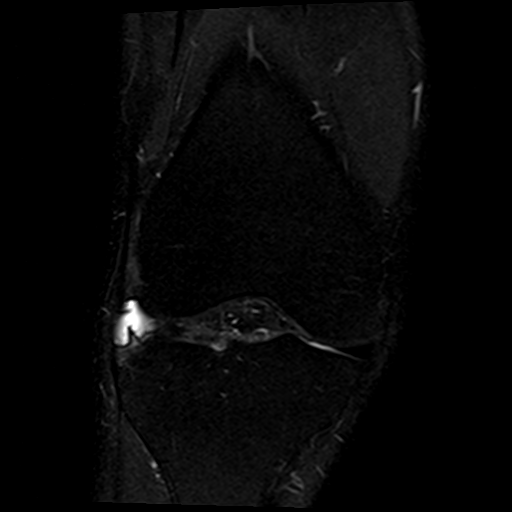
[im 16/26]
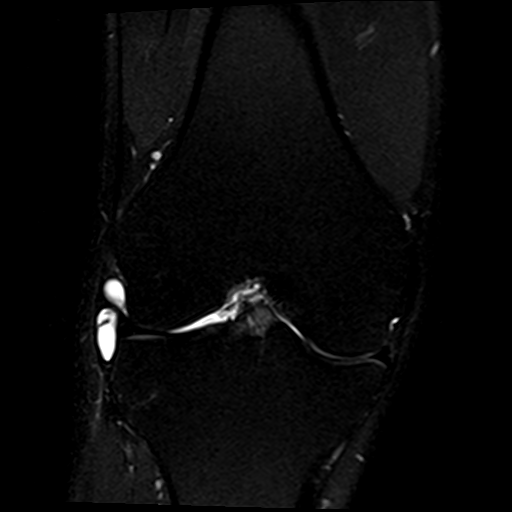
[im 22/26]
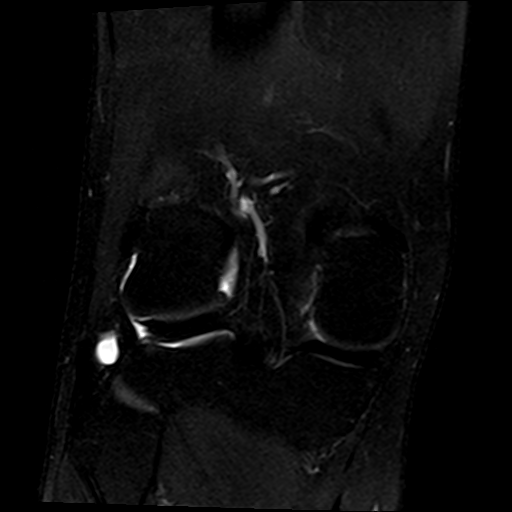

[Series 7: T1 · coronal · 3.0mm · 0.23mm/px · 3 of 26 slices shown]
[im 4/26]
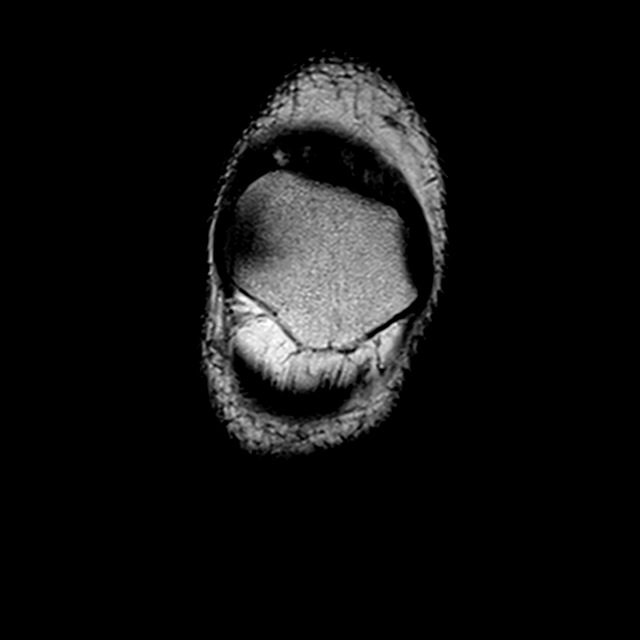
[im 13/26]
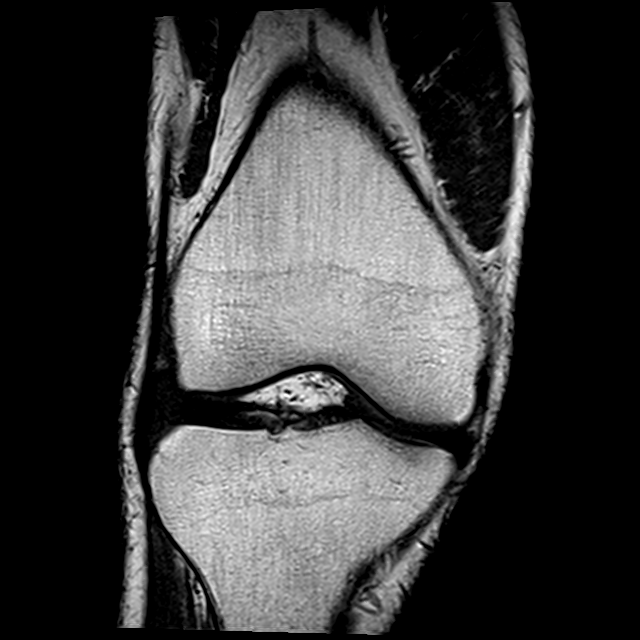
[im 22/26]
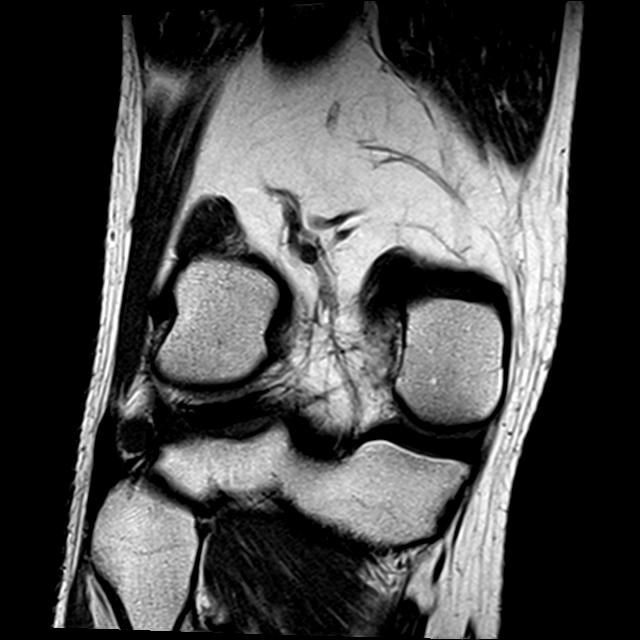

[19 of 40 positions shown; findings below may reference images not displayed]

FINDINGS: MENISCI

Medial meniscus:  Normal.

Lateral meniscus: There is a focal subtle horizontal tear of the
midbody of the lateral meniscus with extension to the periphery.
There is an extensive complex parameniscal cyst measuring 5.5 by
x 0.8 cm extending from the posterior lateral corner to the anterior
lateral corner of the joint.

LIGAMENTS

Cruciates:  Normal.

Collaterals:  Normal.

CARTILAGE

Patellofemoral:  Normal.

Medial:  Normal.

Lateral: Focal 7 mm area of full-thickness cartilage loss of the
posterior aspect of the lateral tibial plateau underneath the
posterior horn of the meniscus.

Joint:  Normal.

Popliteal Fossa:  Normal.

Extensor Mechanism:  Normal.

Bones:  Normal.
IMPRESSION: 1. Subtle focal small horizontal tear of the midbody of the lateral
meniscus extending to the periphery with an extensive parameniscal
cyst as described above.
2. Small focal area of full-thickness cartilage loss of the
posterior aspect of the lateral tibial plateau.

## 2016-09-16 ENCOUNTER — Encounter: Payer: Self-pay | Admitting: Internal Medicine

## 2016-09-16 ENCOUNTER — Ambulatory Visit (INDEPENDENT_AMBULATORY_CARE_PROVIDER_SITE_OTHER): Payer: Medicare Other | Admitting: Internal Medicine

## 2016-09-16 VITALS — BP 130/76 | HR 100 | Temp 97.9°F | Resp 20 | Wt 178.0 lb

## 2016-09-16 DIAGNOSIS — F411 Generalized anxiety disorder: Secondary | ICD-10-CM

## 2016-09-16 DIAGNOSIS — R1310 Dysphagia, unspecified: Secondary | ICD-10-CM | POA: Diagnosis not present

## 2016-09-16 DIAGNOSIS — J019 Acute sinusitis, unspecified: Secondary | ICD-10-CM

## 2016-09-16 DIAGNOSIS — J45909 Unspecified asthma, uncomplicated: Secondary | ICD-10-CM | POA: Diagnosis not present

## 2016-09-16 MED ORDER — ALBUTEROL SULFATE HFA 108 (90 BASE) MCG/ACT IN AERS: 2.0000 | INHALATION_SPRAY | Freq: Four times a day (QID) | RESPIRATORY_TRACT | 11 refills | Status: DC | PRN

## 2016-09-16 MED ORDER — AZITHROMYCIN 250 MG PO TABS: ORAL_TABLET | ORAL | 1 refills | Status: DC

## 2016-09-16 MED ORDER — ALPRAZOLAM 0.5 MG PO TABS: 0.5000 mg | ORAL_TABLET | Freq: Every day | ORAL | 1 refills | Status: DC | PRN

## 2016-09-16 MED ORDER — BENZONATATE 100 MG PO CAPS: ORAL_CAPSULE | ORAL | 0 refills | Status: DC

## 2016-09-16 NOTE — Progress Notes (Signed)
Subjective:    Patient ID: Edward Horns., male    DOB: 10/10/1960, 55 y.o.   MRN: [id number]  HPI   Here with 2-3 days acute onset fever, facial pain, pressure, headache, general weakness and malaise, and greenish d/c, with mild ST and cough, but pt denies chest pain, wheezing, increased sob or doe, orthopnea, PND, increased LE swelling, palpitations, dizziness or syncope.   No longer takes the levbid, asks to take off list.  Has rare dysphagia to solids, overall mild, does not feel needs GI/Dr Henrene Pastor for now, no wt loss or pain.  Denies worsening depressive symptoms, suicidal ideation, or panic; has ongoing anxiety, not increased recently, only takes the xanax daily prn.  Has ongoing right knee pain, due eventually for Surgury.  No other new history Past Medical History:  Diagnosis Date  . Abnormal drug screen 09/2014   neg xanax (but PRN use), pos EtOH  . Acute asthmatic bronchitis   . Allergic rhinitis   . Allergy   . Anxiety    with h/o attacks "on xanax for chest pain"  . Asthma 11/16/2015  . Benign hypertension   . Borderline diabetes mellitus   . Cervical disc disease 11/16/2015  . Chronic pain syndrome    [facility] - percocets 7.5mg   . Depression   . DJD (degenerative joint disease)   . Gastroparesis   . GERD (gastroesophageal reflux disease)   . Habitual alcohol use   . Hyperlipidemia   . Lumbar disc disease 11/16/2015  . Palpitations    Past Surgical History:  Procedure Laterality Date  . APPENDECTOMY    . CERVICAL DISCECTOMY  4/06   decompression, foraminotomy, and fusion Dr. Joya Salm   . COLONOSCOPY  11/2012   1 TA, mod diverticulosis, rpt 5 yrs Deatra Ina)  . EAR CYST EXCISION Right 04/30/2015   Procedure: CYST REMOVAL Right knee;  Surgeon: Ninetta Lights, MD;  Location: [facility];  Service: Orthopedics;  Laterality: Right;  . ESOPHAGOGASTRODUODENOSCOPY  02/2014   gastric polyps, retained gastric residue s/p dilatation Deatra Ina)  . KNEE  ARTHROSCOPY WITH LATERAL MENISECTOMY Right 04/30/2015   Procedure: RIGHT KNEE ARTHROSCOPY;  Surgeon: Ninetta Lights, MD;  Location: [facility];  Service: Orthopedics;  Laterality: Right;  . KNEE ARTHROSCOPY WITH MENISCAL REPAIR Right 04/30/2015   Procedure: KNEE ARTHROSCOPY WITH OPEN MENISCAL REPAIR;  Surgeon: Ninetta Lights, MD;  Location: [facility];  Service: Orthopedics;  Laterality: Right;  . SHOULDER SURGERY Right 11/05   Arthroscopy, debridement, distal resection  . SHOULDER SURGERY Left 05/2011   Dr. Percell Miller  . UPPER GASTROINTESTINAL ENDOSCOPY     X3    reports that he has been smoking Cigarettes.  He has smoked for the past 20.00 years. He has never used smokeless tobacco. He reports that he drinks about 7.2 oz of alcohol per week . He reports that he does not use drugs. family history includes Lung disease in his father. Allergies  Allergen Reactions  . Other Anaphylaxis    BLACKBERRIES  . Peanuts [Peanut Oil] Anaphylaxis  . Shellfish Allergy Anaphylaxis  . Betadine [Povidone Iodine] Other (See Comments)    Due to shellfish allergy   . Nexium [Esomeprazole Magnesium] Other (See Comments)    Chest pain, does ok with prilosec   Current Outpatient Prescriptions on File Prior to Visit  Medication Sig Dispense Refill  . azelastine (OPTIVAR) 0.05 % ophthalmic solution Place 1 drop into both eyes 2 (two) times  daily. 6 mL 12  . baclofen (LIORESAL) 10 MG tablet Take 10 mg by mouth as needed for muscle spasms.    . budesonide-formoterol (SYMBICORT) 160-4.5 MCG/ACT inhaler Inhale 2 puffs into the lungs 2 (two) times daily. 3 Inhaler 3  . buPROPion (WELLBUTRIN) 100 MG tablet Take 100 mg by mouth 2 (two) times daily.    . fenofibrate 160 MG tablet TAKE 1 TABLET BY MOUTH EVERY DAY 90 tablet 3  . losartan (COZAAR) 100 MG tablet Take 1 tablet (100 mg total) by mouth daily. 90 tablet 3  . omeprazole (PRILOSEC) 20 MG capsule Take 1 capsule (20 mg total) by mouth  daily. 90 capsule 3  . oxyCODONE-acetaminophen (PERCOCET) 7.5-325 MG per tablet Take as directed by pain management      No current facility-administered medications on file prior to visit.     Review of Systems  Constitutional: Negative for unusual diaphoresis or night sweats HENT: Negative for ear swelling or discharge Eyes: Negative for worsening visual haziness  Respiratory: Negative for choking and stridor.   Gastrointestinal: Negative for distension or worsening eructation Genitourinary: Negative for retention or change in urine volume.  Musculoskeletal: Negative for other MSK pain or swelling Skin: Negative for color change and worsening wound Neurological: Negative for tremors and numbness other than noted  Psychiatric/Behavioral: Negative for decreased concentration or agitation other than above   All other system neg per pt    Objective:   Physical Exam BP 130/76   Pulse 100   Temp 97.9 F (36.6 C) (Oral)   Resp 20   Wt 178 lb (80.7 kg)   SpO2 97%   BMI 27.88 kg/m  VS noted, .mild ill appearing Constitutional: Pt appears in no apparent distress HENT: Head: NCAT.  Right Ear: External ear normal.  Left Ear: External ear normal.  Eyes: . Pupils are equal, round, and reactive to light. Conjunctivae and EOM are normal Neck: Normal range of motion. Neck supple.  Cardiovascular: Normal rate and regular rhythm.   Pulmonary/Chest: Effort normal and breath sounds without rales or wheezing.  Abd:  Soft, NT, ND, + BS Neurological: Pt is alert. Not confused , motor grossly intact Skin: Skin is warm. No rash, no LE edema Psychiatric: Pt behavior is normal. No agitation. Mild nervous No other new exam findings    Assessment & Plan:

## 2016-09-16 NOTE — Progress Notes (Signed)
Pre visit review using our clinic review tool, if applicable. No additional management support is needed unless otherwise documented below in the visit note. 

## 2016-09-16 NOTE — Patient Instructions (Signed)
Please take all new medication as prescribed - the antibiotic, and cough pills if needed  Please continue all other medications as before, and refills have been done if requested - the xanax, and the inhaler  Please have the pharmacy call with any other refills you may need.  Please keep your appointments with your specialists as you may have planned  Please call if you need a referral to Dr Henrene Pastor regarding the trouble swallowing

## 2016-09-17 DIAGNOSIS — J019 Acute sinusitis, unspecified: Secondary | ICD-10-CM | POA: Insufficient documentation

## 2016-09-17 DIAGNOSIS — R1310 Dysphagia, unspecified: Secondary | ICD-10-CM | POA: Insufficient documentation

## 2016-09-17 NOTE — Assessment & Plan Note (Signed)
stable overall by history and exam, recent data reviewed with pt, and pt to continue medical treatment as before,  to f/u any worsening symptoms or concerns @LASTSAO2(3)@  

## 2016-09-17 NOTE — Assessment & Plan Note (Signed)
stable overall by history and exam, recent data reviewed with pt, and pt to continue medical treatment as before,  to f/u any worsening symptoms or concerns Lab Results  Component Value Date   WBC 7.9 11/16/2015   HGB 14.9 11/16/2015   HCT 41.9 11/16/2015   PLT 182.0 11/16/2015   GLUCOSE 117 (H) 11/16/2015   CHOL 151 11/16/2015   TRIG (H) 11/16/2015    518.0 Triglyceride is over 400; calculations on Lipids are invalid.   HDL 26.90 (L) 11/16/2015   LDLDIRECT 64.0 11/16/2015   LDLCALC 64 01/30/2014   ALT 43 11/16/2015   AST 24 11/16/2015   NA 141 11/16/2015   K 4.5 11/16/2015   CL 104 11/16/2015   CREATININE 1.24 11/16/2015   BUN 18 11/16/2015   CO2 28 11/16/2015   TSH 1.62 11/16/2015   PSA 0.51 11/16/2015   HGBA1C 5.5 09/17/2014

## 2016-09-17 NOTE — Assessment & Plan Note (Signed)
Mild, consider f/u GI but declines for now

## 2016-09-17 NOTE — Assessment & Plan Note (Signed)
Mild to mod, for antibx course,  to f/u any worsening symptoms or concerns 

## 2016-10-10 DIAGNOSIS — K5900 Constipation, unspecified: Secondary | ICD-10-CM | POA: Diagnosis not present

## 2016-10-10 DIAGNOSIS — R0789 Other chest pain: Secondary | ICD-10-CM | POA: Diagnosis not present

## 2016-10-10 DIAGNOSIS — G894 Chronic pain syndrome: Secondary | ICD-10-CM | POA: Diagnosis not present

## 2016-11-16 ENCOUNTER — Encounter: Payer: Self-pay | Admitting: Internal Medicine

## 2016-11-16 ENCOUNTER — Ambulatory Visit (INDEPENDENT_AMBULATORY_CARE_PROVIDER_SITE_OTHER): Payer: Medicare Other | Admitting: Internal Medicine

## 2016-11-16 ENCOUNTER — Other Ambulatory Visit (INDEPENDENT_AMBULATORY_CARE_PROVIDER_SITE_OTHER): Payer: Medicare Other

## 2016-11-16 VITALS — BP 132/82 | HR 78 | Temp 97.6°F | Ht 67.0 in | Wt 179.0 lb

## 2016-11-16 DIAGNOSIS — R079 Chest pain, unspecified: Secondary | ICD-10-CM

## 2016-11-16 DIAGNOSIS — I10 Essential (primary) hypertension: Secondary | ICD-10-CM

## 2016-11-16 DIAGNOSIS — R7989 Other specified abnormal findings of blood chemistry: Secondary | ICD-10-CM

## 2016-11-16 DIAGNOSIS — Z0001 Encounter for general adult medical examination with abnormal findings: Secondary | ICD-10-CM | POA: Diagnosis not present

## 2016-11-16 DIAGNOSIS — J309 Allergic rhinitis, unspecified: Secondary | ICD-10-CM

## 2016-11-16 DIAGNOSIS — F411 Generalized anxiety disorder: Secondary | ICD-10-CM

## 2016-11-16 DIAGNOSIS — R739 Hyperglycemia, unspecified: Secondary | ICD-10-CM | POA: Insufficient documentation

## 2016-11-16 LAB — LIPID PANEL
Cholesterol: 205 mg/dL — ABNORMAL HIGH (ref 0–200)
HDL: 31.4 mg/dL — AB (ref >39.00)
NonHDL: 173.4
Total CHOL/HDL Ratio: 7
Triglycerides: 393 mg/dL — ABNORMAL HIGH (ref 0.0–149.0)
VLDL: 78.6 mg/dL — AB (ref 0.0–40.0)

## 2016-11-16 LAB — CBC WITH DIFFERENTIAL/PLATELET
Basophils Absolute: 0.1 10*3/uL (ref 0.0–0.1)
Basophils Relative: 0.7 % (ref 0.0–3.0)
EOS PCT: 2.3 % (ref 0.0–5.0)
Eosinophils Absolute: 0.2 10*3/uL (ref 0.0–0.7)
HEMATOCRIT: 45.3 % (ref 39.0–52.0)
HEMOGLOBIN: 16 g/dL (ref 13.0–17.0)
Lymphocytes Relative: 30.2 % (ref 12.0–46.0)
Lymphs Abs: 2.6 10*3/uL (ref 0.7–4.0)
MCHC: 35.3 g/dL (ref 30.0–36.0)
MCV: 92.2 fl (ref 78.0–100.0)
MONOS PCT: 6.6 % (ref 3.0–12.0)
Monocytes Absolute: 0.6 10*3/uL (ref 0.1–1.0)
Neutro Abs: 5.2 10*3/uL (ref 1.4–7.7)
Neutrophils Relative %: 60.2 % (ref 43.0–77.0)
Platelets: 192 10*3/uL (ref 150.0–400.0)
RBC: 4.91 Mil/uL (ref 4.22–5.81)
RDW: 13.1 % (ref 11.5–15.5)
WBC: 8.6 10*3/uL (ref 4.0–10.5)

## 2016-11-16 LAB — URINALYSIS, ROUTINE W REFLEX MICROSCOPIC
HGB URINE DIPSTICK: NEGATIVE
KETONES UR: NEGATIVE
LEUKOCYTES UA: NEGATIVE
NITRITE: NEGATIVE
Specific Gravity, Urine: 1.025 (ref 1.000–1.030)
Total Protein, Urine: NEGATIVE
Urine Glucose: NEGATIVE
Urobilinogen, UA: 0.2 (ref 0.0–1.0)
pH: 6 (ref 5.0–8.0)

## 2016-11-16 LAB — HEPATIC FUNCTION PANEL
ALBUMIN: 4.8 g/dL (ref 3.5–5.2)
ALK PHOS: 60 U/L (ref 39–117)
ALT: 27 U/L (ref 0–53)
AST: 18 U/L (ref 0–37)
BILIRUBIN TOTAL: 1 mg/dL (ref 0.2–1.2)
Bilirubin, Direct: 0.1 mg/dL (ref 0.0–0.3)
Total Protein: 7.7 g/dL (ref 6.0–8.3)

## 2016-11-16 LAB — BASIC METABOLIC PANEL
BUN: 22 mg/dL (ref 6–23)
CALCIUM: 10.1 mg/dL (ref 8.4–10.5)
CO2: 28 mEq/L (ref 19–32)
Chloride: 103 mEq/L (ref 96–112)
Creatinine, Ser: 1.15 mg/dL (ref 0.40–1.50)
GFR: 69.46 mL/min (ref >60.00)
GLUCOSE: 108 mg/dL — AB (ref 70–99)
POTASSIUM: 4.3 meq/L (ref 3.5–5.1)
SODIUM: 139 meq/L (ref 135–145)

## 2016-11-16 LAB — LDL CHOLESTEROL, DIRECT: LDL DIRECT: 132 mg/dL

## 2016-11-16 LAB — PSA: PSA: 1.55 ng/mL (ref 0.10–4.00)

## 2016-11-16 LAB — TSH: TSH: 2.75 u[IU]/mL (ref 0.35–4.50)

## 2016-11-16 MED ORDER — CETIRIZINE HCL 10 MG PO TABS: 10.0000 mg | ORAL_TABLET | Freq: Every day | ORAL | 3 refills | Status: DC

## 2016-11-16 MED ORDER — AZELASTINE HCL 0.05 % OP SOLN: 1.0000 [drp] | Freq: Two times a day (BID) | OPHTHALMIC | 12 refills | Status: DC

## 2016-11-16 NOTE — Progress Notes (Signed)
Subjective:    Patient ID: Edward Horns., male    DOB: 26-Feb-1961, 56 y.o.   MRN: [id number]  HPI  Here for wellness and f/u;  Overall doing ok;  Pt denies worsening SOB, DOE, wheezing, orthopnea, PND, worsening LE edema, palpitations, dizziness or syncope.  Pt denies neurological change such as new headache, facial or extremity weakness.  Pt denies polydipsia, polyuria, or low sugar symptoms. Pt states overall good compliance with treatment and medications, good tolerability, and has been trying to follow appropriate diet.  Pt denies worsening depressive symptoms, suicidal ideation or panic. No fever, night sweats, wt loss, loss of appetite, or other constitutional symptoms.  Pt states good ability with ADL's, has low fall risk, home safety reviewed and adequate, no other significant changes in hearing or vision, and only occasionally active with exercise.  Will eventually need right knee TKA due to end stage DJD, but he is putting off for now.  Has pain to walking more than 15 min. Worst pain is always medial right knee with swelling.  Pt continues to have recurring LBP without change in severity, bowel or bladder change, fever, wt loss,  worsening LE pain/numbness/weakness, gait change or falls. Does see pain clinic every other month.    Does have several wks ongoing nasal allergy symptoms with clearish congestion, itch and sneezing, without fever, pain, ST, cough, swelling or wheezing, needs zyrtec restart, will not take the nasal steroids; aks for eye drops due to worsening eye matting and d/c.   Also with chest pain, sharp with anxiety, mild, intermittent, without radiation, diaphoresis, n/v, palp or dizziness.  Did not tolerate lexapro with last visit.   Past Medical History:  Diagnosis Date  . Abnormal drug screen 09/2014   neg xanax (but PRN use), pos EtOH  . Acute asthmatic bronchitis   . Allergic rhinitis   . Allergy   . Anxiety    with h/o attacks "on xanax for chest pain"  . Asthma  11/16/2015  . Benign hypertension   . Borderline diabetes mellitus   . Cervical disc disease 11/16/2015  . Chronic pain syndrome    [facility] - percocets 7.5mg   . Depression   . DJD (degenerative joint disease)   . Gastroparesis   . GERD (gastroesophageal reflux disease)   . Habitual alcohol use   . Hyperlipidemia   . Lumbar disc disease 11/16/2015  . Palpitations    Past Surgical History:  Procedure Laterality Date  . APPENDECTOMY    . CERVICAL DISCECTOMY  4/06   decompression, foraminotomy, and fusion Dr. Joya Salm   . COLONOSCOPY  11/2012   1 TA, mod diverticulosis, rpt 5 yrs Deatra Ina)  . EAR CYST EXCISION Right 04/30/2015   Procedure: CYST REMOVAL Right knee;  Surgeon: Ninetta Lights, MD;  Location: [facility];  Service: Orthopedics;  Laterality: Right;  . ESOPHAGOGASTRODUODENOSCOPY  02/2014   gastric polyps, retained gastric residue s/p dilatation Deatra Ina)  . KNEE ARTHROSCOPY WITH LATERAL MENISECTOMY Right 04/30/2015   Procedure: RIGHT KNEE ARTHROSCOPY;  Surgeon: Ninetta Lights, MD;  Location: [facility];  Service: Orthopedics;  Laterality: Right;  . KNEE ARTHROSCOPY WITH MENISCAL REPAIR Right 04/30/2015   Procedure: KNEE ARTHROSCOPY WITH OPEN MENISCAL REPAIR;  Surgeon: Ninetta Lights, MD;  Location: [facility];  Service: Orthopedics;  Laterality: Right;  . SHOULDER SURGERY Right 11/05   Arthroscopy, debridement, distal resection  . SHOULDER SURGERY Left 05/2011   Dr. Percell Miller  .  UPPER GASTROINTESTINAL ENDOSCOPY     X3    reports that he has been smoking Cigarettes.  He has smoked for the past 20.00 years. He has never used smokeless tobacco. He reports that he drinks about 7.2 oz of alcohol per week . He reports that he does not use drugs. family history includes Lung disease in his father. Allergies  Allergen Reactions  . Other Anaphylaxis    BLACKBERRIES  . Peanuts [Peanut Oil] Anaphylaxis  . Shellfish Allergy Anaphylaxis    . Betadine [Povidone Iodine] Other (See Comments)    Due to shellfish allergy   . Lexapro [Escitalopram Oxalate] Other (See Comments)  . Nexium [Esomeprazole Magnesium] Other (See Comments)    Chest pain, does ok with prilosec   Current Outpatient Prescriptions on File Prior to Visit  Medication Sig Dispense Refill  . albuterol (PROVENTIL HFA;VENTOLIN HFA) 108 (90 Base) MCG/ACT inhaler Inhale 2 puffs into the lungs every 6 (six) hours as needed for wheezing or shortness of breath. 1 Inhaler 11  . ALPRAZolam (XANAX) 0.5 MG tablet Take 1 tablet (0.5 mg total) by mouth daily as needed. 90 tablet 1  . baclofen (LIORESAL) 10 MG tablet Take 10 mg by mouth as needed for muscle spasms.    . budesonide-formoterol (SYMBICORT) 160-4.5 MCG/ACT inhaler Inhale 2 puffs into the lungs 2 (two) times daily. 3 Inhaler 3  . buPROPion (WELLBUTRIN) 100 MG tablet Take 100 mg by mouth 2 (two) times daily.    . fenofibrate 160 MG tablet TAKE 1 TABLET BY MOUTH EVERY DAY 90 tablet 3  . losartan (COZAAR) 100 MG tablet Take 1 tablet (100 mg total) by mouth daily. 90 tablet 3  . omeprazole (PRILOSEC) 20 MG capsule Take 1 capsule (20 mg total) by mouth daily. 90 capsule 3  . oxyCODONE-acetaminophen (PERCOCET) 7.5-325 MG per tablet Take as directed by pain management      No current facility-administered medications on file prior to visit.    Review of Systems Constitutional: Negative for increased diaphoresis, or other activity, appetite or siginficant weight change other than noted HENT: Negative for worsening hearing loss, ear pain, facial swelling, mouth sores and neck stiffness.   Eyes: Negative for other worsening pain, redness or visual disturbance.  Respiratory: Negative for choking or stridor Cardiovascular: Negative for other chest pain and palpitations.  Gastrointestinal: Negative for worsening diarrhea, blood in stool, or abdominal distention Genitourinary: Negative for hematuria, flank pain or change in  urine volume.  Musculoskeletal: Negative for myalgias or other joint complaints.  Skin: Negative for other color change and wound or drainage.  Neurological: Negative for syncope and numbness. other than noted Hematological: Negative for adenopathy. or other swelling Psychiatric/Behavioral: Negative for hallucinations, SI, self-injury, decreased concentration or other worsening agitation.  All other system neg per pt    Objective:   Physical Exam BP 132/82   Pulse 78   Temp 97.6 F (36.4 C)   Ht 5\' 7"  (1.702 m)   Wt 179 lb (81.2 kg)   SpO2 97%   BMI 28.04 kg/m  VS noted,  Constitutional: Pt is oriented to person, place, and time. Appears well-developed and well-nourished, in no significant distress Head: Normocephalic and atraumatic  Eyes: Conjunctivae with bilat mild erythema and slight clear d/c, and EOM are normal. Pupils are equal, round, and reactive to light Right Ear: External ear normal.  Left Ear: External ear normal Nose: Nose normal.  Bilat tm's with mild erythema.  Max sinus areas non tender.  Pharynx with  mild erythema, no exudate Mouth/Throat: Oropharynx is clear and moist  Neck: Normal range of motion. Neck supple. No JVD present. No tracheal deviation present or significant neck LA or mass Cardiovascular: Normal rate, regular rhythm, normal heart sounds and intact distal pulses.   Pulmonary/Chest: Effort normal and breath sounds without rales or wheezing  Abdominal: Soft. Bowel sounds are normal. NT. No HSM  Musculoskeletal: Normal range of motion. Exhibits no edema Lymphadenopathy: Has no cervical adenopathy.  Neurological: Pt is alert and oriented to person, place, and time. Pt has normal reflexes. No cranial nerve deficit. Motor grossly intact Skin: Skin is warm and dry. No rash noted or new ulcers Psychiatric:  Has nervousmood and affect. Behavior is normal.  No other exam findings    Assessment & Plan:

## 2016-11-16 NOTE — Assessment & Plan Note (Signed)
decliens further ssri trial

## 2016-11-16 NOTE — Patient Instructions (Addendum)
Please take all new medication as prescribed - the optivar eye drops  Please continue all other medications as before, and refills have been done if requested - the zyrtec  Please have the pharmacy call with any other refills you may need.  Please continue your efforts at being more active, low cholesterol diet, and weight control.  You are otherwise up to date with prevention measures today.  Please keep your appointments with your specialists as you may have planned  Please go to the XRAY Department in the Basement (go straight as you get off the elevator) for the x-ray testing  Please go to the LAB in the Basement (turn left off the elevator) for the tests to be done today  You will be contacted by phone if any changes need to be made immediately.  Otherwise, you will receive a letter about your results with an explanation, but please check with MyChart first.  Please remember to sign up for MyChart if you have not done so, as this will be important to you in the future with finding out test results, communicating by private email, and scheduling acute appointments online when needed.  If you have Medicare related insurance (such as traditoinal Medicare, [employer] or [employer], or similar), Please make an appointment at the Scheduling desk with Sharee Pimple, the [employer], for your Wellness Visit in this office, which is a benefit with your insurance.  Please return in 1 year for your yearly visit, or sooner if needed, with Lab testing done 3-5 days before

## 2016-11-17 ENCOUNTER — Encounter: Payer: Self-pay | Admitting: Internal Medicine

## 2016-11-17 LAB — HIV ANTIBODY (ROUTINE TESTING W REFLEX): HIV 1&2 Ab, 4th Generation: NONREACTIVE

## 2016-11-19 DIAGNOSIS — J309 Allergic rhinitis, unspecified: Secondary | ICD-10-CM | POA: Insufficient documentation

## 2016-11-19 NOTE — Assessment & Plan Note (Signed)

## 2016-11-19 NOTE — Assessment & Plan Note (Signed)
Atypical, etiology unclear, for cxr, declines ecg and stress test,  to f/u any worsening symptoms or concerns  In addition to the time spent performing CPE, I spent an additional 15 minutes face to face,in which greater than 50% of this time was spent in counseling and coordination of care for patient's illness as documented.

## 2016-11-19 NOTE — Assessment & Plan Note (Signed)
stable overall by history and exam, recent data reviewed with pt, and pt to continue medical treatment as before,  to f/u any worsening symptoms or concerns BP Readings from Last 3 Encounters:  11/16/16 132/82  09/16/16 130/76  11/16/15 136/80

## 2016-11-19 NOTE — Assessment & Plan Note (Signed)
Ok for zyrtec asd, also optivar for eye symptoms,  to f/u any worsening symptoms or concerns

## 2016-11-19 NOTE — Assessment & Plan Note (Signed)
stable overall by history and exam, recent data reviewed with pt, and pt to continue medical treatment as before,  to f/u any worsening symptoms or concerns Lab Results  Component Value Date   HGBA1C 5.5 09/17/2014

## 2016-12-08 DIAGNOSIS — G894 Chronic pain syndrome: Secondary | ICD-10-CM | POA: Diagnosis not present

## 2016-12-08 DIAGNOSIS — Z79891 Long term (current) use of opiate analgesic: Secondary | ICD-10-CM | POA: Diagnosis not present

## 2016-12-08 DIAGNOSIS — K5900 Constipation, unspecified: Secondary | ICD-10-CM | POA: Diagnosis not present

## 2016-12-08 DIAGNOSIS — R0789 Other chest pain: Secondary | ICD-10-CM | POA: Diagnosis not present

## 2017-02-07 DIAGNOSIS — K5900 Constipation, unspecified: Secondary | ICD-10-CM | POA: Diagnosis not present

## 2017-02-07 DIAGNOSIS — G894 Chronic pain syndrome: Secondary | ICD-10-CM | POA: Diagnosis not present

## 2017-02-07 DIAGNOSIS — R0789 Other chest pain: Secondary | ICD-10-CM | POA: Diagnosis not present

## 2017-03-04 ENCOUNTER — Other Ambulatory Visit: Payer: Self-pay | Admitting: Internal Medicine

## 2017-03-06 ENCOUNTER — Other Ambulatory Visit: Payer: Self-pay | Admitting: Internal Medicine

## 2017-04-10 DIAGNOSIS — G894 Chronic pain syndrome: Secondary | ICD-10-CM | POA: Diagnosis not present

## 2017-04-10 DIAGNOSIS — K5900 Constipation, unspecified: Secondary | ICD-10-CM | POA: Diagnosis not present

## 2017-04-10 DIAGNOSIS — R0789 Other chest pain: Secondary | ICD-10-CM | POA: Diagnosis not present

## 2017-06-02 DIAGNOSIS — M546 Pain in thoracic spine: Secondary | ICD-10-CM | POA: Diagnosis not present

## 2017-06-02 DIAGNOSIS — M542 Cervicalgia: Secondary | ICD-10-CM | POA: Diagnosis not present

## 2017-06-09 ENCOUNTER — Telehealth: Payer: Self-pay | Admitting: Gastroenterology

## 2017-06-09 DIAGNOSIS — R0789 Other chest pain: Secondary | ICD-10-CM | POA: Diagnosis not present

## 2017-06-09 DIAGNOSIS — G894 Chronic pain syndrome: Secondary | ICD-10-CM | POA: Diagnosis not present

## 2017-06-09 DIAGNOSIS — Z79891 Long term (current) use of opiate analgesic: Secondary | ICD-10-CM | POA: Diagnosis not present

## 2017-06-09 DIAGNOSIS — M47817 Spondylosis without myelopathy or radiculopathy, lumbosacral region: Secondary | ICD-10-CM | POA: Diagnosis not present

## 2017-06-09 DIAGNOSIS — K5900 Constipation, unspecified: Secondary | ICD-10-CM | POA: Diagnosis not present

## 2017-06-09 DIAGNOSIS — M47812 Spondylosis without myelopathy or radiculopathy, cervical region: Secondary | ICD-10-CM | POA: Diagnosis not present

## 2017-06-10 ENCOUNTER — Other Ambulatory Visit: Payer: Self-pay | Admitting: Internal Medicine

## 2017-06-12 NOTE — Telephone Encounter (Signed)
Yes.  Thank you.

## 2017-06-13 ENCOUNTER — Telehealth: Payer: Self-pay | Admitting: Internal Medicine

## 2017-06-13 NOTE — Telephone Encounter (Signed)
Yes

## 2017-06-13 NOTE — Telephone Encounter (Signed)
Patient last seen 09/17/2014 by Dr. Deatra Ina.  Recently changed care to you.  Ok to give him some Omeprazole before his upcoming appointment?

## 2017-06-13 NOTE — Telephone Encounter (Signed)
Appointment scheduled for 08/08/17

## 2017-06-14 MED ORDER — OMEPRAZOLE 20 MG PO CPDR: 20.0000 mg | DELAYED_RELEASE_CAPSULE | Freq: Every day | ORAL | 0 refills | Status: DC

## 2017-06-14 NOTE — Telephone Encounter (Signed)
Omeprazole filled

## 2017-08-08 ENCOUNTER — Ambulatory Visit: Payer: Medicare Other | Admitting: Internal Medicine

## 2017-08-08 ENCOUNTER — Encounter: Payer: Self-pay | Admitting: Internal Medicine

## 2017-08-08 VITALS — BP 136/78 | HR 80 | Ht 67.0 in | Wt 174.1 lb

## 2017-08-08 DIAGNOSIS — R1013 Epigastric pain: Secondary | ICD-10-CM

## 2017-08-08 DIAGNOSIS — Z86010 Personal history of colonic polyps: Secondary | ICD-10-CM

## 2017-08-08 DIAGNOSIS — K219 Gastro-esophageal reflux disease without esophagitis: Secondary | ICD-10-CM

## 2017-08-08 DIAGNOSIS — R1310 Dysphagia, unspecified: Secondary | ICD-10-CM

## 2017-08-08 MED ORDER — OMEPRAZOLE 20 MG PO CPDR: 20.0000 mg | DELAYED_RELEASE_CAPSULE | Freq: Every day | ORAL | 3 refills | Status: DC

## 2017-08-08 MED ORDER — NA SULFATE-K SULFATE-MG SULF 17.5-3.13-1.6 GM/177ML PO SOLN: 1.0000 | Freq: Once | ORAL | 0 refills | Status: AC

## 2017-08-08 NOTE — Patient Instructions (Signed)
We have sent the following medications to your pharmacy for you to pick up at your convenience:  Omeprazole  You have been scheduled for an endoscopy and colonoscopy. Please follow the written instructions given to you at your visit today. Please pick up your prep supplies at the pharmacy within the next 1-3 days. If you use inhalers (even only as needed), please bring them with you on the day of your procedure. Your physician has requested that you go to www.[email or web address].[email or web address] and enter the access code given to you at your visit today. This web site gives a general overview about your procedure. However, you should still follow specific instructions given to you by our office regarding your preparation for the procedure.   

## 2017-08-08 NOTE — Progress Notes (Signed)
HISTORY OF PRESENT ILLNESS:  Edward Treu. is a 56 y.o. male, husband of Edward Chen, with multiple medical problems as listed below including asthmatic bronchitis, hypertension, chronic pain syndrome, anxiety/depression, chronic alcohol and tobacco use, GERD, gastroparesis, and adenomatous colon polyps. Previous patient of Dr. Erskine Emery. Transferring care to myself at this time. He has not been seen in the office since January 2016. He is self-referred today with chief complaints of epigastric abdominal pain postprandially, reflux symptoms, and dysphagia. He also has questions regarding timing of surveillance colonoscopy. Review of previous records shows upper endoscopy June 2015. The patient was noted to have gastric polyps or retained gastric contents. The esophagus was empirically dilated with 54 [place] Maloney dilator. Patient tells me that this helped his dysphagia. Subsequent radiology imaging including gastric emptying scan confirmed mild gastroparesis with 38% retention at 2 hours (normal 30% or less). Abdominal ultrasound was normal. Gallbladder was said to be contracted with out gallstones. The patient reports infrequent reflux symptoms. He takes omeprazole 20 mg daily. He does have very infrequent intermittent solid food dysphagia. He also describes postprandial epigastric discomfort without increased frequency may last up to one hour. He wonders about anxiety. He wonders about soreness of the distal sternum her xiphoid process. He has had this for some time. Review of laboratories from March 2018 finds a unremarkable comprehensive metabolic panel and CBC including normal liver tests and hemoglobin of 16.0. He smokes about 1 pack of cigarettes per week. He drinks anywhere from 6-9 beers several times per week. His last colonoscopy was performed March 2014. He was found to have tubular adenoma and mild sigmoid diverticulosis. Follow-up in 5 years recommended. Patient has multiple questions regarding  his symptoms  REVIEW OF SYSTEMS:  All non-GI ROS negative unless otherwise stated in the history of present illness except for sinus allergy, anxiety, arthritis, back pain, depression, sleeping problems, shortness of breath  Past Medical History:  Diagnosis Date  . Abnormal drug screen 09/2014   neg xanax (but PRN use), pos EtOH  . Acute asthmatic bronchitis   . Allergic rhinitis   . Allergy   . Anxiety    with h/o attacks "on xanax for chest pain"  . Asthma 11/16/2015  . Benign hypertension   . Borderline diabetes mellitus   . Cervical disc disease 11/16/2015  . Chronic pain syndrome    [facility] - percocets 7.5mg   . Depression   . DJD (degenerative joint disease)   . Gastroparesis   . GERD (gastroesophageal reflux disease)   . Habitual alcohol use   . Hyperlipidemia   . Lumbar disc disease 11/16/2015  . Palpitations     Past Surgical History:  Procedure Laterality Date  . APPENDECTOMY    . CERVICAL DISCECTOMY  4/06   decompression, foraminotomy, and fusion Dr. Joya Chen   . COLONOSCOPY  11/2012   1 TA, mod diverticulosis, rpt 5 yrs Edward Chen)  . EAR CYST EXCISION Right 04/30/2015   Procedure: CYST REMOVAL Right knee;  Surgeon: Edward Lights, MD;  Location: [facility];  Service: Orthopedics;  Laterality: Right;  . ESOPHAGOGASTRODUODENOSCOPY  02/2014   gastric polyps, retained gastric residue s/p dilatation Edward Chen)  . KNEE ARTHROSCOPY WITH LATERAL MENISECTOMY Right 04/30/2015   Procedure: RIGHT KNEE ARTHROSCOPY;  Surgeon: Edward Lights, MD;  Location: [facility];  Service: Orthopedics;  Laterality: Right;  . KNEE ARTHROSCOPY WITH MENISCAL REPAIR Right 04/30/2015   Procedure: KNEE ARTHROSCOPY WITH OPEN MENISCAL REPAIR;  Surgeon: Edward Quince  Dennie Bible, MD;  Location: [facility];  Service: Orthopedics;  Laterality: Right;  . SHOULDER SURGERY Right 11/05   Arthroscopy, debridement, distal resection  . SHOULDER SURGERY Left 05/2011    Dr. Percell Chen  . UPPER GASTROINTESTINAL ENDOSCOPY     X3    Social History Edward Chen.  reports that he has been smoking cigarettes.  He has smoked for the past 20.00 years. he has never used smokeless tobacco. He reports that he drinks about 7.2 oz of alcohol per week. He reports that he does not use drugs.  family history includes Lung disease in his father.  Allergies  Allergen Reactions  . Other Anaphylaxis    BLACKBERRIES  . Peanuts [Peanut Oil] Anaphylaxis  . Shellfish Allergy Anaphylaxis  . Betadine [Povidone Iodine] Other (See Comments)    Due to shellfish allergy   . Lexapro [Escitalopram Oxalate] Other (See Comments)  . Nexium [Esomeprazole Magnesium] Other (See Comments)    Chest pain, does ok with prilosec       PHYSICAL EXAMINATION: Vital signs: BP 136/78 (BP Location: Left Arm, Patient Position: Sitting, Cuff Size: Normal)   Pulse 80   Ht 5\' 7"  (1.702 m)   Wt 174 lb 2 oz (79 kg)   BMI 27.27 kg/m   Constitutional: Somewhat unhealthy appearing , no acute distress Psychiatric: alert and oriented x3, cooperative. Somewhat anxious Eyes: extraocular movements intact, anicteric, conjunctiva pink Mouth: oral pharynx moist, no lesions Neck: supple without thyromegaly Lymph: no lymphadenopathy Cardiovascular: heart regular rate and rhythm, no murmur Lungs: clear to auscultation bilaterally Abdomen: soft, nontender, nondistended, no obvious ascites, no peritoneal signs, normal bowel sounds, no organomegaly Rectal: Deferred until colonoscopy Extremities: no clubbing, cyanosis, or lower extremity edema bilaterally Skin: no lesions on visible extremities. Hypervascular changes on the nose and malar region of the face Neuro: No focal deficits. Cranial nerves intact. No asterixis.    ASSESSMENT:  #1. GERD. On omeprazole 20 mg daily. Occasional breakthrough. I believe this can be overcome with last modification as described below #2. Mild intermittent solid food  dysphagia. Rule out peptic stricture #3. Chronic epigastric discomfort. Negative previous workup with ultrasound and endoscopy. May be due to known gastroparesis #4. Mild gastroparesis documented endoscopically and radiographically #5. History of adenomatous colon polyps. Due for surveillance #6. Multiple medical problems. Stable   PLAN:  #1. Reflux precautions #2. Stop smoking #3. Significantly cut back alcohol #4. Weight reduction #5. Continue omeprazole 20 mg daily. Prescription refilled #6. Schedule upper endoscopy to evaluate epigastric discomfort and dysphagia.The nature of the procedure, as well as the risks, benefits, and alternatives were carefully and thoroughly reviewed with the patient. Ample time for discussion and questions allowed. The patient understood, was satisfied, and agreed to proceed. #7. Schedule colonoscopy for adenomatous polyp surveillance.The nature of the procedure, as well as the risks, benefits, and alternatives were carefully and thoroughly reviewed with the patient. Ample time for discussion and questions allowed. The patient understood, was satisfied, and agreed to proceed. #8. Continue repeat gastric Scan to rule out worsening gastroparesis as a cause for symptoms if above workup negative  60 minutes was spent face-to-face with the patient. Greater than 50% a time use for counseling regarding his multiple GI issues as outlined in the assessment and strict review of recommendations and the rationale

## 2017-08-10 DIAGNOSIS — R0789 Other chest pain: Secondary | ICD-10-CM | POA: Diagnosis not present

## 2017-08-10 DIAGNOSIS — K5900 Constipation, unspecified: Secondary | ICD-10-CM | POA: Diagnosis not present

## 2017-08-10 DIAGNOSIS — G894 Chronic pain syndrome: Secondary | ICD-10-CM | POA: Diagnosis not present

## 2017-09-06 DIAGNOSIS — R0789 Other chest pain: Secondary | ICD-10-CM | POA: Diagnosis not present

## 2017-09-06 DIAGNOSIS — G894 Chronic pain syndrome: Secondary | ICD-10-CM | POA: Diagnosis not present

## 2017-09-06 DIAGNOSIS — K5900 Constipation, unspecified: Secondary | ICD-10-CM | POA: Diagnosis not present

## 2017-09-25 ENCOUNTER — Encounter: Payer: Self-pay | Admitting: Internal Medicine

## 2017-10-04 ENCOUNTER — Ambulatory Visit (AMBULATORY_SURGERY_CENTER): Payer: Medicare Other | Admitting: Internal Medicine

## 2017-10-04 ENCOUNTER — Other Ambulatory Visit: Payer: Self-pay

## 2017-10-04 ENCOUNTER — Encounter: Payer: Self-pay | Admitting: Internal Medicine

## 2017-10-04 VITALS — BP 133/84 | HR 79 | Temp 96.8°F | Resp 12 | Ht 67.0 in | Wt 174.0 lb

## 2017-10-04 DIAGNOSIS — K219 Gastro-esophageal reflux disease without esophagitis: Secondary | ICD-10-CM | POA: Diagnosis not present

## 2017-10-04 DIAGNOSIS — D128 Benign neoplasm of rectum: Secondary | ICD-10-CM | POA: Diagnosis not present

## 2017-10-04 DIAGNOSIS — Z86010 Personal history of colonic polyps: Secondary | ICD-10-CM | POA: Diagnosis not present

## 2017-10-04 DIAGNOSIS — R1319 Other dysphagia: Secondary | ICD-10-CM

## 2017-10-04 DIAGNOSIS — K222 Esophageal obstruction: Secondary | ICD-10-CM

## 2017-10-04 DIAGNOSIS — R1310 Dysphagia, unspecified: Secondary | ICD-10-CM

## 2017-10-04 MED ORDER — SODIUM CHLORIDE 0.9 % IV SOLN: 500.0000 mL | Freq: Once | INTRAVENOUS | Status: DC

## 2017-10-04 NOTE — Patient Instructions (Signed)
Information on polyps given to you today  Information on reflux ( reflux precautions given to you today  Contrast ( 2 bottles) given to you today / Dr Blanch Media office nurse will be calling you with time and date for CT scan and instructions for drinking contrast for this scan   YOU HAD AN ENDOSCOPIC PROCEDURE TODAY AT [facility]:   Refer to the procedure report that was given to you for any specific questions about what was found during the examination.  If the procedure report does not answer your questions, please call your gastroenterologist to clarify.  If you requested that your care partner not be given the details of your procedure findings, then the procedure report has been included in a sealed envelope for you to review at your convenience later.  YOU SHOULD EXPECT: Some feelings of bloating in the abdomen. Passage of more gas than usual.  Walking can help get rid of the air that was put into your GI tract during the procedure and reduce the bloating. If you had a lower endoscopy (such as a colonoscopy or flexible sigmoidoscopy) you may notice spotting of blood in your stool or on the toilet paper. If you underwent a bowel prep for your procedure, you may not have a normal bowel movement for a few days.  Please Note:  You might notice some irritation and congestion in your nose or some drainage.  This is from the oxygen used during your procedure.  There is no need for concern and it should clear up in a day or so.  SYMPTOMS TO REPORT IMMEDIATELY:   Following lower endoscopy (colonoscopy or flexible sigmoidoscopy):  Excessive amounts of blood in the stool  Significant tenderness or worsening of abdominal pains  Swelling of the abdomen that is new, acute  Fever of 100F or higher   Following upper endoscopy (EGD)  Vomiting of blood or coffee ground material  New chest pain or pain under the shoulder blades  Painful or persistently difficult swallowing  New  shortness of breath  Fever of 100F or higher  Black, tarry-looking stools  For urgent or emergent issues, a gastroenterologist can be reached at any hour by calling [phone].   DIET:  We do recommend a small meal at first, but then you may proceed to your regular diet.  Drink plenty of fluids but you should avoid alcoholic beverages for 24 hours.  ACTIVITY:  You should plan to take it easy for the rest of today and you should NOT DRIVE or use heavy machinery until tomorrow (because of the sedation medicines used during the test).    FOLLOW UP: Our staff will call the number listed on your records the next business day following your procedure to check on you and address any questions or concerns that you may have regarding the information given to you following your procedure. If we do not reach you, we will leave a message.  However, if you are feeling well and you are not experiencing any problems, there is no need to return our call.  We will assume that you have returned to your regular daily activities without incident.  If any biopsies were taken you will be contacted by phone or by letter within the next 1-3 weeks.  Please call us at [phone] if you have not heard about the biopsies in 3 weeks.    SIGNATURES/CONFIDENTIALITY: You and/or your care partner have signed paperwork which will be entered into your  electronic medical record.  These signatures attest to the fact that that the information above on your After Visit Summary has been reviewed and is understood.  Full responsibility of the confidentiality of this discharge information lies with you and/or your care-partner.

## 2017-10-04 NOTE — Progress Notes (Signed)
Called to room to assist during endoscopic procedure.  Patient ID and intended procedure confirmed with present staff. Received instructions for my participation in the procedure from the performing physician.  

## 2017-10-04 NOTE — Op Note (Signed)
[facility] Patient Name: Edward Chen Procedure Date: 10/04/2017 12:50 PM MRN: [id number] Endoscopist: Docia Chuck. Henrene Pastor , MD Age: 57 Referring MD:  Date of Birth: 07-10-1961 Gender: Male Account #: [id number] Procedure:                Colonoscopy, with cold snare polypectomy x 1 Indications:              High risk colon cancer surveillance: Personal                            history of non-advanced adenoma. Index examination                            March 2014 (Dr. Deatra Ina) Medicines:                Monitored Anesthesia Care Procedure:                Pre-Anesthesia Assessment:                           - Prior to the procedure, a History and Physical                            was performed, and patient medications and                            allergies were reviewed. The patient's tolerance of                            previous anesthesia was also reviewed. The risks                            and benefits of the procedure and the sedation                            options and risks were discussed with the patient.                            All questions were answered, and informed consent                            was obtained. Prior Anticoagulants: The patient has                            taken no previous anticoagulant or antiplatelet                            agents. ASA Grade Assessment: II - A patient with                            mild systemic disease. After reviewing the risks                            and benefits, the patient was deemed in  satisfactory condition to undergo the procedure.                           After obtaining informed consent, the colonoscope                            was passed under direct vision. Throughout the                            procedure, the patient's blood pressure, pulse, and                            oxygen saturations were monitored continuously. The   Colonoscope was introduced through the anus and                            advanced to the the cecum, identified by                            appendiceal orifice and ileocecal valve. The                            ileocecal valve, appendiceal orifice, and rectum                            were photographed. The quality of the bowel                            preparation was good. The colonoscopy was performed                            without difficulty. The patient tolerated the                            procedure well. The bowel preparation used was                            SUPREP. Scope In: 1:05:48 PM Scope Out: 1:21:44 PM Scope Withdrawal Time: 0 hours 10 minutes 29 seconds  Total Procedure Duration: 0 hours 15 minutes 56 seconds  Findings:                 A 3 mm polyp was found in the rectum. The polyp was                            removed with a cold snare. Resection and retrieval                            were complete.                           The exam was otherwise without abnormality on                            direct and retroflexion views. Internal hemorrhoids  present. Complications:            No immediate complications. Estimated blood loss:                            None. Estimated Blood Loss:     Estimated blood loss: none. Impression:               - One 3 mm polyp in the rectum, removed with a cold                            snare. Resected and retrieved.                           - The examination was otherwise normal on direct                            and retroflexion views. Recommendation:           - Repeat colonoscopy in 5-10 years for surveillance.                           - Patient has a contact number available for                            emergencies. The signs and symptoms of potential                            delayed complications were discussed with the                            patient. Return to normal  activities tomorrow.                            Written discharge instructions were provided to the                            patient.                           - Resume previous diet.                           - Continue present medications.                           - Await pathology results. Docia Chuck. Henrene Pastor, MD 10/04/2017 1:37:13 PM This report has been signed electronically.

## 2017-10-04 NOTE — Progress Notes (Signed)
Pt given(with d/c instructions) 2 bottles of Contrast (Readi-Cat 2) for CT scan that will be ordered by Dr [facility] office nurse and pt given instruction sheet to fill out with dates and times for scan and for when to drink contrast.

## 2017-10-04 NOTE — Progress Notes (Signed)
Pt's states no medical or surgical changes since previsit or office visit. 

## 2017-10-04 NOTE — Op Note (Signed)
[facility] Patient Name: Edward Chen Procedure Date: 10/04/2017 12:49 PM MRN: [id number] Endoscopist: Docia Chuck. Henrene Pastor , MD Age: 57 Referring MD:  Date of Birth: 12-Jan-1961 Gender: Male Account #: [id number] Procedure:                Upper GI endoscopy, with [facility] dilation of the                            esophagus?"33f Indications:              Dysphagia, Epigastric abdominal pain, Esophageal                            reflux Medicines:                Monitored Anesthesia Care Procedure:                Pre-Anesthesia Assessment:                           - Prior to the procedure, a History and Physical                            was performed, and patient medications and                            allergies were reviewed. The patient's tolerance of                            previous anesthesia was also reviewed. The risks                            and benefits of the procedure and the sedation                            options and risks were discussed with the patient.                            All questions were answered, and informed consent                            was obtained. Prior Anticoagulants: The patient has                            taken no previous anticoagulant or antiplatelet                            agents. ASA Grade Assessment: II - A patient with                            mild systemic disease. After reviewing the risks                            and benefits, the patient was deemed in  satisfactory condition to undergo the procedure.                           After obtaining informed consent, the endoscope was                            passed under direct vision. Throughout the                            procedure, the patient's blood pressure, pulse, and                            oxygen saturations were monitored continuously. The                            Model GIF-HQ190 [id number]) scope was introduced                       through the mouth, and advanced to the second part                            of duodenum. The upper GI endoscopy was                            accomplished without difficulty. The patient                            tolerated the procedure well. Scope In: Scope Out: Findings:                 One moderate benign-appearing, intrinsic stenosis                            was found 38 cm from the incisors. This measured                            1.5 cm (inner diameter). The scope was withdrawn.                            Dilation was performed with a Maloney dilator with                            no resistance at 85 Fr.                           The exam of the esophagus was otherwise normal.                           The stomach was normal.                           The examined duodenum was normal.                           The cardia and gastric fundus were normal on  retroflexion. Complications:            No immediate complications. Estimated Blood Loss:     Estimated blood loss: none. Impression:               1. GERD                           2. Large caliber distal esophageal ring status post                            dilation                           3. Otherwise normal EGD                           4. Epigastric pain. Recommendation:           1. Post-dilation diet                           2. Continue current medications                           3. Reflux precautions                           4. Schedule contrast-enhanced CT scan of the                            abdomen "epigastric pain, evaluate". Docia Chuck. Henrene Pastor, MD 10/04/2017 1:41:19 PM This report has been signed electronically.

## 2017-10-04 NOTE — Progress Notes (Signed)
To recovery, report to RN, VSS. 

## 2017-10-05 ENCOUNTER — Telehealth: Payer: Self-pay

## 2017-10-05 ENCOUNTER — Other Ambulatory Visit: Payer: Self-pay

## 2017-10-05 DIAGNOSIS — R1084 Generalized abdominal pain: Secondary | ICD-10-CM

## 2017-10-05 NOTE — Telephone Encounter (Signed)
  Follow up Call-  Call back number 10/04/2017  Post procedure Call Back phone  # [phone]  Permission to leave phone message Yes  Some recent data might be hidden     Patient questions:  Do you have a fever, pain , or abdominal swelling? No. Pain Score  0 *  Have you tolerated food without any problems? Yes.    Have you been able to return to your normal activities? Yes.    Do you have any questions about your discharge instructions: Diet   No. Medications  No. Follow up visit  No.  Do you have questions or concerns about your Care? No.  Actions: * If pain score is 4 or above: No action needed, pain <4.

## 2017-10-05 NOTE — Telephone Encounter (Signed)
Pt scheduled for CT of abd at [facility] CT 10/12/17@8 :30am, pt to arrive there at 8:15am. Pt to be NPO except for bottle 1 of contrast at 6:30am and bottle 2 at 7:30am. Pt aware of appt. States he needs to work the appt around his work scheduled. Pt given the phone number [phone] to schedule the appt to a date that works for him.

## 2017-10-09 ENCOUNTER — Encounter: Payer: Self-pay | Admitting: Internal Medicine

## 2017-10-12 ENCOUNTER — Inpatient Hospital Stay: Admission: RE | Admit: 2017-10-12 | Payer: Medicare Other | Source: Ambulatory Visit

## 2017-10-23 ENCOUNTER — Ambulatory Visit: Payer: Self-pay

## 2017-10-23 NOTE — Telephone Encounter (Signed)
Phone call from pt.  Reported had episodes of dizziness last week, while working outside in the warmer weather.  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  Reported the episodes would last about 15 sec.  Denied chest pain, shortness of breath, nausea associated with the episodes.  Denied feeling his heart rate was increased, or that it felt irregular, at time of the dizziness.  Stated he has not had further episodes of the dizziness since he had been working outside in the heat on Friday.  Reported he takes Oxycodone for pain management, and takes Losartan for BP.  Reported he has not been eating well, since separated from wife; stated only eats one meal / day.  Appt. Scheduled with PCP on 10/26/17.  Care advice given per protocol.  Verb. Understanding.  Agrees with plan.         Reason for Disposition . [1] MILD dizziness (e.g., walking normally) AND [2] has NOT been evaluated by physician for this  (Exception: dizziness caused by heat exposure, sudden standing, or poor fluid intake)  Answer Assessment - Initial Assessment Questions 1. DESCRIPTION: "Describe your dizziness."     Felt like he was dizzy with bending over and straightening back up; was working in the heat 2. LIGHTHEADED: "Do you feel lightheaded?" (e.g., somewhat faint, woozy, weak upon standing)     Somewhat faint; lasted about 15 seconds 3. VERTIGO: "Do you feel like either you or the room is spinning or tilting?" (i.e. vertigo)     Denied spinning around of objects 4. SEVERITY: "How bad is it?"  "Do you feel like you are going to faint?" "Can you stand and walk?"   - MILD - walking normally   - MODERATE - interferes with normal activities (e.g., work, school)    - SEVERE - unable to stand, requires support to walk, feels like passing out now.      Difficult to state; thought maybe moderate ; denied any dizziness since  Friday, when working outside in the heat.   5. ONSET:  "When did the dizziness begin?"     Friday 6. AGGRAVATING FACTORS: "Does anything make it worse?" (e.g., standing, change in head position)     Bending over 7. HEART RATE: "Can you tell me your heart rate?" "How many beats in 15 seconds?"  (Note: not all patients can do this)       Did not feel any change at time of the dizziness 8. CAUSE: "What do you think is causing the dizziness?"     Unknown 9. RECURRENT SYMPTOM: "Have you had dizziness before?" If so, ask: "When was the last time?" "What happened that time?"     Never had dizziness like this before 10. OTHER SYMPTOMS: "Do you have any other symptoms?" (e.g., fever, chest pain, vomiting, diarrhea, bleeding)       Denied nausea/ vomiting, chest pain, or sweating. 11. PREGNANCY: "Is there any chance you are pregnant?" "When was your last menstrual period?"       n/a  Protocols used: DIZZINESS [facility]

## 2017-10-26 ENCOUNTER — Other Ambulatory Visit (INDEPENDENT_AMBULATORY_CARE_PROVIDER_SITE_OTHER): Payer: Medicare Other

## 2017-10-26 ENCOUNTER — Ambulatory Visit (INDEPENDENT_AMBULATORY_CARE_PROVIDER_SITE_OTHER): Payer: Medicare Other | Admitting: Internal Medicine

## 2017-10-26 ENCOUNTER — Encounter: Payer: Self-pay | Admitting: Internal Medicine

## 2017-10-26 VITALS — HR 67 | Temp 98.0°F | Ht 67.0 in | Wt 159.0 lb

## 2017-10-26 DIAGNOSIS — R739 Hyperglycemia, unspecified: Secondary | ICD-10-CM | POA: Diagnosis not present

## 2017-10-26 DIAGNOSIS — N529 Male erectile dysfunction, unspecified: Secondary | ICD-10-CM

## 2017-10-26 DIAGNOSIS — Z0001 Encounter for general adult medical examination with abnormal findings: Secondary | ICD-10-CM

## 2017-10-26 DIAGNOSIS — F418 Other specified anxiety disorders: Secondary | ICD-10-CM | POA: Diagnosis not present

## 2017-10-26 DIAGNOSIS — J019 Acute sinusitis, unspecified: Secondary | ICD-10-CM | POA: Diagnosis not present

## 2017-10-26 DIAGNOSIS — J309 Allergic rhinitis, unspecified: Secondary | ICD-10-CM | POA: Diagnosis not present

## 2017-10-26 LAB — BASIC METABOLIC PANEL
BUN: 13 mg/dL (ref 6–23)
CHLORIDE: 101 meq/L (ref 96–112)
CO2: 30 meq/L (ref 19–32)
CREATININE: 1.07 mg/dL (ref 0.40–1.50)
Calcium: 9.9 mg/dL (ref 8.4–10.5)
GFR: 75.24 mL/min (ref >60.00)
Glucose, Bld: 131 mg/dL — ABNORMAL HIGH (ref 70–99)
Potassium: 4.6 mEq/L (ref 3.5–5.1)
Sodium: 139 mEq/L (ref 135–145)

## 2017-10-26 LAB — URINALYSIS, ROUTINE W REFLEX MICROSCOPIC
Hgb urine dipstick: NEGATIVE
Ketones, ur: NEGATIVE
Leukocytes, UA: NEGATIVE
NITRITE: NEGATIVE
PH: 6 (ref 5.0–8.0)
RBC / HPF: NONE SEEN (ref 0–?)
SPECIFIC GRAVITY, URINE: 1.025 (ref 1.000–1.030)
Total Protein, Urine: NEGATIVE
URINE GLUCOSE: NEGATIVE
Urobilinogen, UA: 0.2 (ref 0.0–1.0)

## 2017-10-26 LAB — CBC WITH DIFFERENTIAL/PLATELET
BASOS ABS: 0.1 10*3/uL (ref 0.0–0.1)
BASOS PCT: 0.8 % (ref 0.0–3.0)
EOS ABS: 0.1 10*3/uL (ref 0.0–0.7)
Eosinophils Relative: 1.1 % (ref 0.0–5.0)
HCT: 45.4 % (ref 39.0–52.0)
Hemoglobin: 16 g/dL (ref 13.0–17.0)
LYMPHS ABS: 1.5 10*3/uL (ref 0.7–4.0)
LYMPHS PCT: 20.1 % (ref 12.0–46.0)
MCHC: 35.2 g/dL (ref 30.0–36.0)
MCV: 92.4 fl (ref 78.0–100.0)
MONO ABS: 0.5 10*3/uL (ref 0.1–1.0)
Monocytes Relative: 6.6 % (ref 3.0–12.0)
NEUTROS ABS: 5.4 10*3/uL (ref 1.4–7.7)
NEUTROS PCT: 71.4 % (ref 43.0–77.0)
PLATELETS: 183 10*3/uL (ref 150.0–400.0)
RBC: 4.91 Mil/uL (ref 4.22–5.81)
RDW: 12.6 % (ref 11.5–15.5)
WBC: 7.6 10*3/uL (ref 4.0–10.5)

## 2017-10-26 LAB — LIPID PANEL
CHOL/HDL RATIO: 5
CHOLESTEROL: 173 mg/dL (ref 0–200)
HDL: 38.5 mg/dL — ABNORMAL LOW (ref >39.00)
LDL CALC: 103 mg/dL — AB (ref 0–99)
NonHDL: 134.88
Triglycerides: 159 mg/dL — ABNORMAL HIGH (ref 0.0–149.0)
VLDL: 31.8 mg/dL (ref 0.0–40.0)

## 2017-10-26 LAB — HEPATIC FUNCTION PANEL
ALK PHOS: 67 U/L (ref 39–117)
ALT: 31 U/L (ref 0–53)
AST: 74 U/L — ABNORMAL HIGH (ref 0–37)
Albumin: 4.6 g/dL (ref 3.5–5.2)
BILIRUBIN DIRECT: 0.2 mg/dL (ref 0.0–0.3)
BILIRUBIN TOTAL: 0.9 mg/dL (ref 0.2–1.2)
TOTAL PROTEIN: 7.3 g/dL (ref 6.0–8.3)

## 2017-10-26 LAB — TSH: TSH: 1.5 u[IU]/mL (ref 0.35–4.50)

## 2017-10-26 LAB — HEMOGLOBIN A1C: HEMOGLOBIN A1C: 5.4 % (ref 4.6–6.5)

## 2017-10-26 LAB — PSA: PSA: 0.85 ng/mL (ref 0.10–4.00)

## 2017-10-26 MED ORDER — SILDENAFIL CITRATE 100 MG PO TABS: 50.0000 mg | ORAL_TABLET | Freq: Every day | ORAL | 11 refills | Status: DC | PRN

## 2017-10-26 MED ORDER — AZITHROMYCIN 250 MG PO TABS: ORAL_TABLET | ORAL | 1 refills | Status: DC

## 2017-10-26 MED ORDER — LOSARTAN POTASSIUM 100 MG PO TABS: 100.0000 mg | ORAL_TABLET | Freq: Every day | ORAL | 3 refills | Status: DC

## 2017-10-26 MED ORDER — BUPROPION HCL 100 MG PO TABS: 100.0000 mg | ORAL_TABLET | Freq: Every day | ORAL | 3 refills | Status: DC

## 2017-10-26 MED ORDER — FENOFIBRATE 160 MG PO TABS: 160.0000 mg | ORAL_TABLET | Freq: Every day | ORAL | 3 refills | Status: DC

## 2017-10-26 NOTE — Progress Notes (Signed)
Subjective:    Patient ID: Edward Horns., male    DOB: [birth year], 59 y.o.   MRN: [id number]  HPI  Here for wellness and f/u;  Overall doing ok;  Pt denies Chest pain, worsening SOB, DOE, wheezing, orthopnea, PND, worsening LE edema, palpitations, or syncope.  Pt denies neurological change such as new headache, facial or extremity weakness.  Pt denies polydipsia, polyuria, or low sugar symptoms. Pt states overall good compliance with treatment and medications, good tolerability, and has been trying to follow appropriate diet.   No fever, night sweats, wt loss, loss of appetite, or other constitutional symptoms.  Pt states good ability with ADL's, has low fall risk, home safety reviewed and adequate, no other significant changes in hearing or vision, and not very active with exercise. Wife left several months ago, he has lost 17 lbs, has been trying to work part time with some lifting despite his bad back., but was ewokrking a hot day last wk on top of his meds and was getting dizzy on standing up quickly from bending over.   Does have several wks ongoing nasal allergy symptoms with clearish congestion, itch and sneezing, without fever, pain, ST, cough, swelling or wheezing, but has had some ear fullness as well bilat. Had stopped wellbutrin about 4 mo ago, has been  worsening depressive symptoms, but no suicidal ideation, or panic; has ongoing anxiety.  Having some ED symptoms in last 6 mo.  Has been ordered CT abd recenltly per GI, he just hasnt had a chance to schedule. Also has recurrent sinusitis, very interested in zpack for recurring symptoms.  Also with worsening ED symptoms x 6 mo, asks for viagra Past Medical History:  Diagnosis Date  . Abnormal drug screen 09/2014   neg xanax (but PRN use), pos EtOH  . Acute asthmatic bronchitis   . Allergic rhinitis   . Allergy   . Anxiety    with h/o attacks "on xanax for chest pain"  . Asthma 11/16/2015  . Benign hypertension   . Borderline diabetes  mellitus   . Cervical disc disease 11/16/2015  . Chronic pain syndrome    [facility] - percocets 7.5mg   . Depression   . DJD (degenerative joint disease)   . Gastroparesis   . GERD (gastroesophageal reflux disease)   . Habitual alcohol use   . Hyperlipidemia   . Lumbar disc disease 11/16/2015  . Palpitations    Past Surgical History:  Procedure Laterality Date  . APPENDECTOMY    . CERVICAL DISCECTOMY  4/06   decompression, foraminotomy, and fusion Dr. Joya Salm   . COLONOSCOPY  11/2012   1 TA, mod diverticulosis, rpt 5 yrs Deatra Ina)  . EAR CYST EXCISION Right 04/30/2015   Procedure: CYST REMOVAL Right knee;  Surgeon: Ninetta Lights, MD;  Location: [facility];  Service: Orthopedics;  Laterality: Right;  . ESOPHAGOGASTRODUODENOSCOPY  02/2014   gastric polyps, retained gastric residue s/p dilatation Deatra Ina)  . KNEE ARTHROSCOPY WITH LATERAL MENISECTOMY Right 04/30/2015   Procedure: RIGHT KNEE ARTHROSCOPY;  Surgeon: Ninetta Lights, MD;  Location: [facility];  Service: Orthopedics;  Laterality: Right;  . KNEE ARTHROSCOPY WITH MENISCAL REPAIR Right 04/30/2015   Procedure: KNEE ARTHROSCOPY WITH OPEN MENISCAL REPAIR;  Surgeon: Ninetta Lights, MD;  Location: [facility];  Service: Orthopedics;  Laterality: Right;  . SHOULDER SURGERY Right 11/05   Arthroscopy, debridement, distal resection  . SHOULDER SURGERY Left 05/2011   Dr. Percell Miller  .  UPPER GASTROINTESTINAL ENDOSCOPY     X3    reports that he has been smoking cigarettes.  He has smoked for the past 20.00 years. he has never used smokeless tobacco. He reports that he drinks about 7.2 oz of alcohol per week. He reports that he does not use drugs. family history includes Lung disease in his father. Allergies  Allergen Reactions  . Other Anaphylaxis    BLACKBERRIES  . Peanuts [Peanut Oil] Anaphylaxis  . Shellfish Allergy Anaphylaxis  . Betadine [Povidone Iodine] Other (See Comments)    Due  to shellfish allergy   . Lexapro [Escitalopram Oxalate] Other (See Comments)  . Nexium [Esomeprazole Magnesium] Other (See Comments)    Chest pain, does ok with prilosec   Current Outpatient Medications on File Prior to Visit  Medication Sig Dispense Refill  . albuterol (PROVENTIL HFA;VENTOLIN HFA) 108 (90 Base) MCG/ACT inhaler Inhale 2 puffs into the lungs every 6 (six) hours as needed for wheezing or shortness of breath. 1 Inhaler 11  . ALPRAZolam (XANAX) 0.5 MG tablet Take 1 tablet (0.5 mg total) by mouth daily as needed. 90 tablet 1  . aspirin EC 81 MG tablet Take 81 mg by mouth daily.    . baclofen (LIORESAL) 10 MG tablet Take 10 mg by mouth as needed for muscle spasms.    . cetirizine (ZYRTEC) 10 MG tablet Take 1 tablet (10 mg total) by mouth daily. (Patient taking differently: Take 10 mg by mouth as needed. ) 90 tablet 3  . NON FORMULARY CBD Oil Drops daily    . omeprazole (PRILOSEC) 20 MG capsule Take 1 capsule (20 mg total) by mouth daily. 90 capsule 3  . oxyCODONE-acetaminophen (PERCOCET) 7.5-325 MG per tablet Take as directed by pain management      No current facility-administered medications on file prior to visit.    Review of Systems Constitutional: Negative for other unusual diaphoresis, sweats, appetite or weight changes HENT: Negative for other worsening hearing loss, ear pain, facial swelling, mouth sores or neck stiffness.   Eyes: Negative for other worsening pain, redness or other visual disturbance.  Respiratory: Negative for other stridor or swelling Cardiovascular: Negative for other palpitations or other chest pain  Gastrointestinal: Negative for worsening diarrhea or loose stools, blood in stool, distention or other pain Genitourinary: Negative for hematuria, flank pain or other change in urine volume.  Musculoskeletal: Negative for myalgias or other joint swelling.  Skin: Negative for other color change, or other wound or worsening drainage.  Neurological:  Negative for other syncope or numbness. Hematological: Negative for other adenopathy or swelling Psychiatric/Behavioral: Negative for hallucinations, other worsening agitation, SI, self-injury, or new decreased concentration All other system neg per pt    Objective:   Physical Exam Pulse 67   Temp 98 F (36.7 C) (Oral)   Ht 5\' 7"  (1.702 m)   Wt 159 lb (72.1 kg)   SpO2 99%   BMI 24.90 kg/m  VS noted,  Constitutional: Pt is oriented to person, place, and time. Appears well-developed and well-nourished, in no significant distress and comfortable Head: Normocephalic and atraumatic  Eyes: Conjunctivae and EOM are normal. Pupils are equal, round, and reactive to light Bilat tm's with mild erythema.  Max sinus areas mild tender.  Pharynx with mild erythema, no exudate  Right Ear: External ear normal without discharge Left Ear: External ear normal without discharge Nose: Nose without discharge or deformity Mouth/Throat: Oropharynx is without other ulcerations and moist  Neck: Normal range of motion. Neck  supple. No JVD present. No tracheal deviation present or significant neck LA or mass Cardiovascular: Normal rate, regular rhythm, normal heart sounds and intact distal pulses.   Pulmonary/Chest: WOB normal and breath sounds without rales or wheezing  Abdominal: Soft. Bowel sounds are normal. NT. No HSM  Musculoskeletal: Normal range of motion. Exhibits no edema Lymphadenopathy: Has no other cervical adenopathy.  Neurological: Pt is alert and oriented to person, place, and time. Pt has normal reflexes. No cranial nerve deficit. Motor grossly intact, Gait intact Skin: Skin is warm and dry. No rash noted or new ulcerations Psychiatric:  Has depressed mood and affect. Behavior is normal without agitation No other exam findings    Assessment & Plan:

## 2017-10-26 NOTE — Patient Instructions (Addendum)
/  Please take all new medication as prescribed - the antibiotic and viagra as discussed  Please continue all other medications as before, including restarting the wellbutrin  Please have the pharmacy call with any other refills you may need.  Please continue your efforts at being more active, low cholesterol diet, and weight control.  You are otherwise up to date with prevention measures today.  Please keep your appointments with your specialists as you may have planned  Please go to the LAB in the Basement (turn left off the elevator) for the tests to be done today  You will be contacted by phone if any changes need to be made immediately.  Otherwise, you will receive a letter about your results with an explanation, but please check with MyChart first.  Please remember to sign up for MyChart if you have not done so, as this will be important to you in the future with finding out test results, communicating by private email, and scheduling acute appointments online when needed.  Please return in 1 year for your yearly visit, or sooner if needed, with Lab testing done 3-5 days before

## 2017-10-29 ENCOUNTER — Encounter: Payer: Self-pay | Admitting: Internal Medicine

## 2017-10-29 DIAGNOSIS — F418 Other specified anxiety disorders: Secondary | ICD-10-CM

## 2017-10-29 DIAGNOSIS — N529 Male erectile dysfunction, unspecified: Secondary | ICD-10-CM | POA: Insufficient documentation

## 2017-10-29 HISTORY — DX: Other specified anxiety disorders: F41.8

## 2017-10-29 HISTORY — DX: Male erectile dysfunction, unspecified: N52.9

## 2017-10-29 NOTE — Assessment & Plan Note (Signed)
Ok to restart wellbutrin 100 qd, declines higher dosing though I suspect he may do better with this, declines counseling referral

## 2017-10-29 NOTE — Assessment & Plan Note (Signed)
stable overall by history and exam, recent data reviewed with pt, and pt to continue medical treatment as before,  to f/u any worsening symptoms or concerns Lab Results  Component Value Date   HGBA1C 5.4 10/26/2017

## 2017-10-29 NOTE — Assessment & Plan Note (Signed)

## 2017-10-29 NOTE — Assessment & Plan Note (Signed)
With intermittent dizziness, ok for mucinex prn with otc zyrtec and nasacort asd prn

## 2017-10-29 NOTE — Assessment & Plan Note (Addendum)
Mild to mod, for antibx course,  to f/u any worsening symptoms or concerns  In addition to the time spent performing CPE, I spent an additional 25 minutes face to face,in which greater than 50% of this time was spent in counseling and coordination of care for patient's acute illness as documented, including the differential dx, treatment, further evaluation and other management of acute sinus infection, allergic rhinitis, depression, ED, and hyperglycemia

## 2017-10-29 NOTE — Assessment & Plan Note (Signed)
Mild to mod, for viagra prn,  to f/u any worsening symptoms or concerns 

## 2017-11-09 DIAGNOSIS — G894 Chronic pain syndrome: Secondary | ICD-10-CM | POA: Diagnosis not present

## 2017-11-09 DIAGNOSIS — Z79891 Long term (current) use of opiate analgesic: Secondary | ICD-10-CM | POA: Diagnosis not present

## 2017-11-09 DIAGNOSIS — R0789 Other chest pain: Secondary | ICD-10-CM | POA: Diagnosis not present

## 2017-11-09 DIAGNOSIS — K5900 Constipation, unspecified: Secondary | ICD-10-CM | POA: Diagnosis not present

## 2017-12-11 ENCOUNTER — Telehealth: Payer: Self-pay | Admitting: Internal Medicine

## 2017-12-11 NOTE — Telephone Encounter (Signed)
Ok to fill out?

## 2017-12-11 NOTE — Telephone Encounter (Signed)
Patient has dropped off disability forms to be completed. These forms had last been completed in 2017.   LOV: 10/26/2017  Please advise if ok to complete. Thank you.

## 2017-12-12 DIAGNOSIS — Z0279 Encounter for issue of other medical certificate: Secondary | ICD-10-CM

## 2017-12-12 NOTE — Telephone Encounter (Signed)
Forms have been completed & placed in providers box to review and sign.  °

## 2017-12-12 NOTE — Telephone Encounter (Signed)
Forms have been faxed to [employer], copy sent to scan & charged for.   Patient informed the original is ready to pick up.

## 2018-01-04 DIAGNOSIS — K5900 Constipation, unspecified: Secondary | ICD-10-CM | POA: Diagnosis not present

## 2018-01-04 DIAGNOSIS — G894 Chronic pain syndrome: Secondary | ICD-10-CM | POA: Diagnosis not present

## 2018-01-04 DIAGNOSIS — R0789 Other chest pain: Secondary | ICD-10-CM | POA: Diagnosis not present

## 2018-01-22 ENCOUNTER — Telehealth: Payer: Self-pay | Admitting: Internal Medicine

## 2018-01-22 MED ORDER — SILDENAFIL CITRATE 100 MG PO TABS: 50.0000 mg | ORAL_TABLET | Freq: Every day | ORAL | 6 refills | Status: DC | PRN

## 2018-01-22 NOTE — Telephone Encounter (Signed)
[facility]  Pt already has  Had  some of the refills  Filled already he  Has  According to  What  He has  Already filled  6  Refills  Left

## 2018-01-22 NOTE — Telephone Encounter (Signed)
Copied from [place] [id number]. Topic: Quick Communication - Rx Refill/Question >> Jan 22, 2018  9:41 AM Aurelio Brash B wrote: Medication:sildenafil (VIAGRA) 100 MG tablet  Has the patient contacted their pharmacy?  No new pharmacy   (Agent: If no, request that the patient contact the pharmacy for the refill.)  Preferred Pharmacy (with phone number or street name): [facility] - [place], [place] - [address]  Agent: Please be advised that RX refills may take up to 3 business days. We ask that you follow-up with your pharmacy.

## 2018-01-22 NOTE — Addendum Note (Signed)
Addended by: Biagio Borg on: 01/22/2018 08:29 PM   Modules accepted: Orders

## 2018-01-22 NOTE — Telephone Encounter (Signed)
Viagra  Refill  Quantity  Increase  Patient is  Requesting  20 pills  per refill  Instead of  5 Pharmacy requested is  [facility]  [place]  LOV 10/26/2017

## 2018-01-22 NOTE — Telephone Encounter (Signed)
Ok, [facility] [place]

## 2018-01-24 MED ORDER — SILDENAFIL CITRATE 100 MG PO TABS: 50.0000 mg | ORAL_TABLET | Freq: Every day | ORAL | 6 refills | Status: DC | PRN

## 2018-01-24 NOTE — Addendum Note (Signed)
Addended by: Biagio Borg on: 01/24/2018 05:56 PM   Modules accepted: Orders

## 2018-01-24 NOTE — Telephone Encounter (Signed)
Done erx - [place], [place] - [address]

## 2018-01-24 NOTE — Telephone Encounter (Signed)
Pt is needing the medication sent to [place], [place] - [address]

## 2018-02-06 DIAGNOSIS — G894 Chronic pain syndrome: Secondary | ICD-10-CM | POA: Diagnosis not present

## 2018-02-06 DIAGNOSIS — R0789 Other chest pain: Secondary | ICD-10-CM | POA: Diagnosis not present

## 2018-02-06 DIAGNOSIS — K5900 Constipation, unspecified: Secondary | ICD-10-CM | POA: Diagnosis not present

## 2018-04-06 DIAGNOSIS — K5900 Constipation, unspecified: Secondary | ICD-10-CM | POA: Diagnosis not present

## 2018-04-06 DIAGNOSIS — G894 Chronic pain syndrome: Secondary | ICD-10-CM | POA: Diagnosis not present

## 2018-04-06 DIAGNOSIS — M7062 Trochanteric bursitis, left hip: Secondary | ICD-10-CM | POA: Diagnosis not present

## 2018-04-06 DIAGNOSIS — R0789 Other chest pain: Secondary | ICD-10-CM | POA: Diagnosis not present

## 2018-04-17 ENCOUNTER — Telehealth: Payer: Self-pay | Admitting: Emergency Medicine

## 2018-04-17 NOTE — Telephone Encounter (Signed)
Called patient to schedule AWV. Pt declined at this time. 

## 2018-05-09 ENCOUNTER — Telehealth: Payer: Self-pay | Admitting: Internal Medicine

## 2018-05-09 MED ORDER — ALPRAZOLAM 0.5 MG PO TABS: 0.5000 mg | ORAL_TABLET | Freq: Every day | ORAL | 1 refills | Status: DC | PRN

## 2018-05-09 NOTE — Telephone Encounter (Signed)
Request for controlled medication: prescription expired on 03/25/17  Xanax 0.5 mg  refill Last Refill:09/16/16 # 90 with 1 refill Last OV: 10/26/17 PCP: Dr. Jenny Reichmann [facility] [phone]

## 2018-05-09 NOTE — Telephone Encounter (Signed)
Done erx 

## 2018-05-09 NOTE — Addendum Note (Signed)
Addended by: Biagio Borg on: 05/09/2018 12:05 PM   Modules accepted: Orders

## 2018-05-09 NOTE — Telephone Encounter (Signed)
Copied from [place] [id number]. Topic: Quick Communication - Rx Refill/Question >> May 09, 2018  9:56 AM Selinda Flavin B, NT wrote: Medication: ALPRAZolam Duanne Moron) 0.5 MG tablet   Has the patient contacted their pharmacy? Yes.   (Agent: If no, request that the patient contact the pharmacy for the refill.) (Agent: If yes, when and what did the pharmacy advise?)  Preferred Pharmacy (with phone number or street name): [id number] [place], [place]: Please be advised that RX refills may take up to 3 business days. We ask that you follow-up with your pharmacy.

## 2018-06-07 DIAGNOSIS — R0789 Other chest pain: Secondary | ICD-10-CM | POA: Diagnosis not present

## 2018-06-07 DIAGNOSIS — Z79891 Long term (current) use of opiate analgesic: Secondary | ICD-10-CM | POA: Diagnosis not present

## 2018-06-07 DIAGNOSIS — K5900 Constipation, unspecified: Secondary | ICD-10-CM | POA: Diagnosis not present

## 2018-06-07 DIAGNOSIS — G894 Chronic pain syndrome: Secondary | ICD-10-CM | POA: Diagnosis not present

## 2018-06-29 ENCOUNTER — Other Ambulatory Visit: Payer: Self-pay | Admitting: Internal Medicine

## 2018-08-07 DIAGNOSIS — M7062 Trochanteric bursitis, left hip: Secondary | ICD-10-CM | POA: Diagnosis not present

## 2018-08-07 DIAGNOSIS — G894 Chronic pain syndrome: Secondary | ICD-10-CM | POA: Diagnosis not present

## 2018-08-07 DIAGNOSIS — R0789 Other chest pain: Secondary | ICD-10-CM | POA: Diagnosis not present

## 2018-08-07 DIAGNOSIS — K5900 Constipation, unspecified: Secondary | ICD-10-CM | POA: Diagnosis not present

## 2018-10-05 DIAGNOSIS — R0789 Other chest pain: Secondary | ICD-10-CM | POA: Diagnosis not present

## 2018-10-05 DIAGNOSIS — M7711 Lateral epicondylitis, right elbow: Secondary | ICD-10-CM | POA: Diagnosis not present

## 2018-10-05 DIAGNOSIS — G894 Chronic pain syndrome: Secondary | ICD-10-CM | POA: Diagnosis not present

## 2018-10-05 DIAGNOSIS — K5900 Constipation, unspecified: Secondary | ICD-10-CM | POA: Diagnosis not present

## 2018-10-31 ENCOUNTER — Encounter: Payer: Self-pay | Admitting: Internal Medicine

## 2018-10-31 ENCOUNTER — Other Ambulatory Visit (INDEPENDENT_AMBULATORY_CARE_PROVIDER_SITE_OTHER): Payer: Medicare Other

## 2018-10-31 ENCOUNTER — Ambulatory Visit (INDEPENDENT_AMBULATORY_CARE_PROVIDER_SITE_OTHER): Payer: Medicare Other | Admitting: Internal Medicine

## 2018-10-31 VITALS — BP 124/72 | HR 71 | Temp 97.8°F | Ht 67.0 in | Wt 137.0 lb

## 2018-10-31 DIAGNOSIS — R739 Hyperglycemia, unspecified: Secondary | ICD-10-CM | POA: Diagnosis not present

## 2018-10-31 DIAGNOSIS — I10 Essential (primary) hypertension: Secondary | ICD-10-CM | POA: Diagnosis not present

## 2018-10-31 DIAGNOSIS — N529 Male erectile dysfunction, unspecified: Secondary | ICD-10-CM

## 2018-10-31 DIAGNOSIS — Z0001 Encounter for general adult medical examination with abnormal findings: Secondary | ICD-10-CM

## 2018-10-31 DIAGNOSIS — F411 Generalized anxiety disorder: Secondary | ICD-10-CM

## 2018-10-31 DIAGNOSIS — M7711 Lateral epicondylitis, right elbow: Secondary | ICD-10-CM | POA: Diagnosis not present

## 2018-10-31 DIAGNOSIS — J069 Acute upper respiratory infection, unspecified: Secondary | ICD-10-CM | POA: Insufficient documentation

## 2018-10-31 LAB — URINALYSIS, ROUTINE W REFLEX MICROSCOPIC
Bilirubin Urine: NEGATIVE
Hgb urine dipstick: NEGATIVE
Ketones, ur: NEGATIVE
Leukocytes,Ua: NEGATIVE
Nitrite: NEGATIVE
RBC / HPF: NONE SEEN (ref 0–?)
SPECIFIC GRAVITY, URINE: 1.025 (ref 1.000–1.030)
Total Protein, Urine: NEGATIVE
Urine Glucose: NEGATIVE
Urobilinogen, UA: 0.2 (ref 0.0–1.0)
pH: 6.5 (ref 5.0–8.0)

## 2018-10-31 LAB — BASIC METABOLIC PANEL
BUN: 15 mg/dL (ref 6–23)
CO2: 31 mEq/L (ref 19–32)
Calcium: 9.4 mg/dL (ref 8.4–10.5)
Chloride: 104 mEq/L (ref 96–112)
Creatinine, Ser: 0.93 mg/dL (ref 0.40–1.50)
GFR: 82.94 mL/min (ref >60.00)
Glucose, Bld: 100 mg/dL — ABNORMAL HIGH (ref 70–99)
Potassium: 4.7 mEq/L (ref 3.5–5.1)
Sodium: 140 mEq/L (ref 135–145)

## 2018-10-31 LAB — HEPATIC FUNCTION PANEL
ALT: 9 U/L (ref 0–53)
AST: 13 U/L (ref 0–37)
Albumin: 4.4 g/dL (ref 3.5–5.2)
Alkaline Phosphatase: 64 U/L (ref 39–117)
BILIRUBIN DIRECT: 0.2 mg/dL (ref 0.0–0.3)
TOTAL PROTEIN: 6.8 g/dL (ref 6.0–8.3)
Total Bilirubin: 1.1 mg/dL (ref 0.2–1.2)

## 2018-10-31 LAB — CBC WITH DIFFERENTIAL/PLATELET
Basophils Absolute: 0 10*3/uL (ref 0.0–0.1)
Basophils Relative: 0.9 % (ref 0.0–3.0)
EOS PCT: 3.4 % (ref 0.0–5.0)
Eosinophils Absolute: 0.2 10*3/uL (ref 0.0–0.7)
HCT: 44.5 % (ref 39.0–52.0)
Hemoglobin: 15.8 g/dL (ref 13.0–17.0)
Lymphocytes Relative: 34.2 % (ref 12.0–46.0)
Lymphs Abs: 1.7 10*3/uL (ref 0.7–4.0)
MCHC: 35.5 g/dL (ref 30.0–36.0)
MCV: 93.2 fl (ref 78.0–100.0)
MONOS PCT: 6.9 % (ref 3.0–12.0)
Monocytes Absolute: 0.3 10*3/uL (ref 0.1–1.0)
Neutro Abs: 2.8 10*3/uL (ref 1.4–7.7)
Neutrophils Relative %: 54.6 % (ref 43.0–77.0)
Platelets: 174 10*3/uL (ref 150.0–400.0)
RBC: 4.78 Mil/uL (ref 4.22–5.81)
RDW: 13.4 % (ref 11.5–15.5)
WBC: 5.1 10*3/uL (ref 4.0–10.5)

## 2018-10-31 LAB — TSH: TSH: 1.54 u[IU]/mL (ref 0.35–4.50)

## 2018-10-31 LAB — LIPID PANEL
CHOL/HDL RATIO: 3
Cholesterol: 149 mg/dL (ref 0–200)
HDL: 53.8 mg/dL (ref >39.00)
LDL Cholesterol: 84 mg/dL (ref 0–99)
NONHDL: 95.31
Triglycerides: 58 mg/dL (ref 0.0–149.0)
VLDL: 11.6 mg/dL (ref 0.0–40.0)

## 2018-10-31 LAB — HEMOGLOBIN A1C: Hgb A1c MFr Bld: 4.6 % (ref 4.6–6.5)

## 2018-10-31 LAB — PSA: PSA: 0.81 ng/mL (ref 0.10–4.00)

## 2018-10-31 MED ORDER — LOSARTAN POTASSIUM 100 MG PO TABS: 100.0000 mg | ORAL_TABLET | Freq: Every day | ORAL | 3 refills | Status: DC

## 2018-10-31 MED ORDER — MELOXICAM 15 MG PO TABS: 15.0000 mg | ORAL_TABLET | Freq: Every day | ORAL | 3 refills | Status: DC

## 2018-10-31 MED ORDER — SILDENAFIL CITRATE 100 MG PO TABS: ORAL_TABLET | ORAL | 5 refills | Status: DC

## 2018-10-31 MED ORDER — ALPRAZOLAM 0.5 MG PO TABS: 0.5000 mg | ORAL_TABLET | Freq: Every day | ORAL | 1 refills | Status: DC | PRN

## 2018-10-31 MED ORDER — AZITHROMYCIN 250 MG PO TABS: ORAL_TABLET | ORAL | 1 refills | Status: DC

## 2018-10-31 NOTE — Assessment & Plan Note (Addendum)
For mobic prn,  to f/u any worsening symptoms or concerns  In addition to the time spent performing CPE, I spent an additional 25 minutes face to face,in which greater than 50% of this time was spent in counseling and coordination of care for patient's acute illness as documented, including the differential dx, treatment, further evaluation and other management of right lateral epicondylitis, anxiety, hyperglyemia, URI, and HTN, ED and anxiety

## 2018-10-31 NOTE — Assessment & Plan Note (Signed)

## 2018-10-31 NOTE — Assessment & Plan Note (Signed)
stable overall by history and exam, recent data reviewed with pt, and pt to continue medical treatment as before,  to f/u any worsening symptoms or concerns  

## 2018-10-31 NOTE — Patient Instructions (Signed)
Please take all new medication as prescribed - the zpack, as well as the Mobic for pain   Please continue all other medications as before, and refills have been done if requested - viagra, xanax  Please have the pharmacy call with any other refills you may need.  Please continue your efforts at being more active, low cholesterol diet, and weight control.  You are otherwise up to date with prevention measures today.  Please keep your appointments with your specialists as you may have planned  Please go to the LAB in the Basement (turn left off the elevator) for the tests to be done today  You will be contacted by phone if any changes need to be made immediately.  Otherwise, you will receive a letter about your results with an explanation, but please check with MyChart first.  Please remember to sign up for MyChart if you have not done so, as this will be important to you in the future with finding out test results, communicating by private email, and scheduling acute appointments online when needed.  Please return in 1 year for your yearly visit, or sooner if needed, with Lab testing done 3-5 days before

## 2018-10-31 NOTE — Assessment & Plan Note (Signed)
For viagra prn 

## 2018-10-31 NOTE — Progress Notes (Signed)
Subjective:    Patient ID: Edward Horns., male    DOB: May 22, 1961, 58 y.o.   MRN: [id number]  HPI  Here for wellness and f/u;  Overall doing ok;  Pt denies Chest pain, worsening SOB, DOE, wheezing, orthopnea, PND, worsening LE edema, palpitations, dizziness or syncope.  Pt denies neurological change such as new headache, facial or extremity weakness.  Pt denies polydipsia, polyuria, or low sugar symptoms. Pt states overall good compliance with treatment and medications, good tolerability, and has been trying to follow appropriate diet.  No fever, night sweats, wt loss, loss of appetite, or other constitutional symptoms.  Pt states good ability with ADL's, has low fall risk, home safety reviewed and adequate, no other significant changes in hearing or vision, and only occasionally active with exercise.  Also S/p right lateral epicondylitis and left hip bursitis cortisone shots that helped per pain , but still right elbow pain, asks for nsaid prn.  Not taking the fenofibrate as he has lost wt with better diet, wants to check today.  Also with worsening ED symptoms x 6 mo, asks for viagra prn.  Denies worsening depressive symptoms, suicidal ideation, or panic; has ongoing anxiety, not increased recently.  Also ,  Here with 2-3 days acute onset fever, facial pain, pressure, headache, general weakness and malaise, and greenish d/c, with mild ST and cough  Past Medical History:  Diagnosis Date  . Abnormal drug screen 09/2014   neg xanax (but PRN use), pos EtOH  . Acute asthmatic bronchitis   . Allergic rhinitis   . Allergy   . Anxiety    with h/o attacks "on xanax for chest pain"  . Asthma 11/16/2015  . Benign hypertension   . Borderline diabetes mellitus   . Cervical disc disease 11/16/2015  . Chronic pain syndrome    [facility] - percocets 7.5mg   . Depression   . Depression with anxiety 10/29/2017  . DJD (degenerative joint disease)   . Erectile dysfunction 10/29/2017  . Gastroparesis    . GERD (gastroesophageal reflux disease)   . Habitual alcohol use   . Hyperlipidemia   . Lumbar disc disease 11/16/2015  . Palpitations    Past Surgical History:  Procedure Laterality Date  . APPENDECTOMY    . CERVICAL DISCECTOMY  4/06   decompression, foraminotomy, and fusion Dr. Joya Salm   . COLONOSCOPY  11/2012   1 TA, mod diverticulosis, rpt 5 yrs Deatra Ina)  . EAR CYST EXCISION Right 04/30/2015   Procedure: CYST REMOVAL Right knee;  Surgeon: Ninetta Lights, MD;  Location: [facility];  Service: Orthopedics;  Laterality: Right;  . ESOPHAGOGASTRODUODENOSCOPY  02/2014   gastric polyps, retained gastric residue s/p dilatation Deatra Ina)  . KNEE ARTHROSCOPY WITH LATERAL MENISECTOMY Right 04/30/2015   Procedure: RIGHT KNEE ARTHROSCOPY;  Surgeon: Ninetta Lights, MD;  Location: [facility];  Service: Orthopedics;  Laterality: Right;  . KNEE ARTHROSCOPY WITH MENISCAL REPAIR Right 04/30/2015   Procedure: KNEE ARTHROSCOPY WITH OPEN MENISCAL REPAIR;  Surgeon: Ninetta Lights, MD;  Location: [facility];  Service: Orthopedics;  Laterality: Right;  . SHOULDER SURGERY Right 11/05   Arthroscopy, debridement, distal resection  . SHOULDER SURGERY Left 05/2011   Dr. Percell Miller  . UPPER GASTROINTESTINAL ENDOSCOPY     X3    reports that he has been smoking cigarettes. He has smoked for the past 20.00 years. He has never used smokeless tobacco. He reports current alcohol use of about 12.0  standard drinks of alcohol per week. He reports that he does not use drugs. family history includes Lung disease in his father. Allergies  Allergen Reactions  . Other Anaphylaxis    BLACKBERRIES  . Peanuts [Peanut Oil] Anaphylaxis  . Shellfish Allergy Anaphylaxis  . Betadine [Povidone Iodine] Other (See Comments)    Due to shellfish allergy   . Lexapro [Escitalopram Oxalate] Other (See Comments)  . Nexium [Esomeprazole Magnesium] Other (See Comments)    Chest pain, does ok with  prilosec   Current Outpatient Medications on File Prior to Visit  Medication Sig Dispense Refill  . albuterol (PROVENTIL HFA;VENTOLIN HFA) 108 (90 Base) MCG/ACT inhaler Inhale 2 puffs into the lungs every 6 (six) hours as needed for wheezing or shortness of breath. 1 Inhaler 11  . aspirin EC 81 MG tablet Take 81 mg by mouth daily.    . baclofen (LIORESAL) 10 MG tablet Take 10 mg by mouth as needed for muscle spasms.    . fenofibrate 160 MG tablet Take 1 tablet (160 mg total) by mouth daily. 90 tablet 3  . NON FORMULARY CBD Oil Drops daily    . omeprazole (PRILOSEC) 20 MG capsule Take 1 capsule (20 mg total) by mouth daily. 90 capsule 3  . oxyCODONE-acetaminophen (PERCOCET) 7.5-325 MG per tablet Take as directed by pain management     . cetirizine (ZYRTEC) 10 MG tablet Take 1 tablet (10 mg total) by mouth daily. (Patient taking differently: Take 10 mg by mouth as needed. ) 90 tablet 3   No current facility-administered medications on file prior to visit.    Review of Systems Constitutional: Negative for other unusual diaphoresis, sweats, appetite or weight changes HENT: Negative for other worsening hearing loss, ear pain, facial swelling, mouth sores or neck stiffness.   Eyes: Negative for other worsening pain, redness or other visual disturbance.  Respiratory: Negative for other stridor or swelling Cardiovascular: Negative for other palpitations or other chest pain  Gastrointestinal: Negative for worsening diarrhea or loose stools, blood in stool, distention or other pain Genitourinary: Negative for hematuria, flank pain or other change in urine volume.  Musculoskeletal: Negative for myalgias or other joint swelling.  Skin: Negative for other color change, or other wound or worsening drainage.  Neurological: Negative for other syncope or numbness. Hematological: Negative for other adenopathy or swelling Psychiatric/Behavioral: Negative for hallucinations, other worsening agitation, SI,  self-injury, or new decreased concentration All other system neg per pt    Objective:   Physical Exam BP 124/72   Pulse 71   Temp 97.8 F (36.6 C) (Oral)   Ht 5\' 7"  (1.702 m)   Wt 137 lb (62.1 kg)   SpO2 97%   BMI 21.46 kg/m  VS noted, mild ill Constitutional: Pt is oriented to person, place, and time. Appears well-developed and well-nourished, in no significant distress and comfortable Head: Normocephalic and atraumatic  Eyes: Conjunctivae and EOM are normal. Pupils are equal, round, and reactive to light Bilat tm's with mild erythema.  Max sinus areas mild tender.  Pharynx with mild erythema, no exudate Right Ear: External ear normal without discharge Left Ear: External ear normal without discharge Nose: Nose without discharge or deformity Mouth/Throat: Oropharynx is without other ulcerations and moist  Neck: Normal range of motion. Neck supple. No JVD present. No tracheal deviation present or significant neck LA or mass Cardiovascular: Normal rate, regular rhythm, normal heart sounds and intact distal pulses.   Pulmonary/Chest: WOB normal and breath sounds without rales or wheezing  Abdominal: Soft. Bowel sounds are normal. NT. No HSM  Musculoskeletal: Normal range of motion. Exhibits no edema, + tender to right lateral epicondyle Lymphadenopathy: Has no other cervical adenopathy.  Neurological: Pt is alert and oriented to person, place, and time. Pt has normal reflexes. No cranial nerve deficit. Motor grossly intact, Gait intact Skin: Skin is warm and dry. No rash noted or new ulcerations Psychiatric:  Has nervous mood and affect. Behavior is normal without agitation No other exam findings Lab Results  Component Value Date   WBC 5.1 10/31/2018   HGB 15.8 10/31/2018   HCT 44.5 10/31/2018   PLT 174.0 10/31/2018   GLUCOSE 100 (H) 10/31/2018   CHOL 149 10/31/2018   TRIG 58.0 10/31/2018   HDL 53.80 10/31/2018   LDLDIRECT 132.0 11/16/2016   LDLCALC 84 10/31/2018   ALT 9  10/31/2018   AST 13 10/31/2018   NA 140 10/31/2018   K 4.7 10/31/2018   CL 104 10/31/2018   CREATININE 0.93 10/31/2018   BUN 15 10/31/2018   CO2 31 10/31/2018   TSH 1.54 10/31/2018   PSA 0.81 10/31/2018   HGBA1C 4.6 10/31/2018      Assessment & Plan:

## 2018-10-31 NOTE — Assessment & Plan Note (Signed)
Mild to mod, for antibx course,  to f/u any worsening symptoms or concerns 

## 2018-11-21 DIAGNOSIS — R0789 Other chest pain: Secondary | ICD-10-CM | POA: Diagnosis not present

## 2018-11-21 DIAGNOSIS — M7711 Lateral epicondylitis, right elbow: Secondary | ICD-10-CM | POA: Diagnosis not present

## 2018-11-21 DIAGNOSIS — G894 Chronic pain syndrome: Secondary | ICD-10-CM | POA: Diagnosis not present

## 2018-11-21 DIAGNOSIS — K5900 Constipation, unspecified: Secondary | ICD-10-CM | POA: Diagnosis not present

## 2018-12-06 ENCOUNTER — Telehealth: Payer: Self-pay | Admitting: Internal Medicine

## 2018-12-06 NOTE — Telephone Encounter (Signed)
Copied from [place] [id number]. Topic: General - Other >> Dec 06, 2018  2:08 PM Celene Kras A wrote: Reason for CRM: Pt left vm requesting refill on omeprazole (PRILOSEC) 20 MG capsule [[id number]].  [id number] [facility], [place] - [address] [place] [address] Phone: [phone] Fax: [phone] Not a 24 hour pharmacy; exact hours not known.

## 2018-12-07 ENCOUNTER — Telehealth: Payer: Self-pay | Admitting: Internal Medicine

## 2018-12-07 MED ORDER — OMEPRAZOLE 20 MG PO CPDR: 20.0000 mg | DELAYED_RELEASE_CAPSULE | Freq: Every day | ORAL | 0 refills | Status: DC

## 2018-12-07 NOTE — Telephone Encounter (Signed)
Pt left a vm requesting Dr. Jenny Reichmann to fill a rx that was prescribed by another provider. Pt did not leave name of medication. I saw that Dr. Jenny Reichmann called in an rx today for pt. Attempted to reach pt no answer. Left vm to call back with information of medication.

## 2018-12-13 ENCOUNTER — Other Ambulatory Visit: Payer: Self-pay | Admitting: Internal Medicine

## 2019-01-31 DIAGNOSIS — K5900 Constipation, unspecified: Secondary | ICD-10-CM | POA: Diagnosis not present

## 2019-01-31 DIAGNOSIS — M47817 Spondylosis without myelopathy or radiculopathy, lumbosacral region: Secondary | ICD-10-CM | POA: Diagnosis not present

## 2019-01-31 DIAGNOSIS — R0789 Other chest pain: Secondary | ICD-10-CM | POA: Diagnosis not present

## 2019-01-31 DIAGNOSIS — G894 Chronic pain syndrome: Secondary | ICD-10-CM | POA: Diagnosis not present

## 2019-02-01 DIAGNOSIS — M25522 Pain in left elbow: Secondary | ICD-10-CM | POA: Diagnosis not present

## 2019-02-01 DIAGNOSIS — M25521 Pain in right elbow: Secondary | ICD-10-CM | POA: Diagnosis not present

## 2019-02-05 DIAGNOSIS — M25532 Pain in left wrist: Secondary | ICD-10-CM | POA: Diagnosis not present

## 2019-03-05 ENCOUNTER — Other Ambulatory Visit: Payer: Self-pay | Admitting: Internal Medicine

## 2019-04-17 DIAGNOSIS — R0789 Other chest pain: Secondary | ICD-10-CM | POA: Diagnosis not present

## 2019-04-17 DIAGNOSIS — G894 Chronic pain syndrome: Secondary | ICD-10-CM | POA: Diagnosis not present

## 2019-04-17 DIAGNOSIS — Z79891 Long term (current) use of opiate analgesic: Secondary | ICD-10-CM | POA: Diagnosis not present

## 2019-04-17 DIAGNOSIS — K5900 Constipation, unspecified: Secondary | ICD-10-CM | POA: Diagnosis not present

## 2019-06-13 DIAGNOSIS — R0789 Other chest pain: Secondary | ICD-10-CM | POA: Diagnosis not present

## 2019-06-13 DIAGNOSIS — K5900 Constipation, unspecified: Secondary | ICD-10-CM | POA: Diagnosis not present

## 2019-06-13 DIAGNOSIS — G894 Chronic pain syndrome: Secondary | ICD-10-CM | POA: Diagnosis not present

## 2019-08-12 DIAGNOSIS — R0789 Other chest pain: Secondary | ICD-10-CM | POA: Diagnosis not present

## 2019-08-12 DIAGNOSIS — K5900 Constipation, unspecified: Secondary | ICD-10-CM | POA: Diagnosis not present

## 2019-08-12 DIAGNOSIS — G894 Chronic pain syndrome: Secondary | ICD-10-CM | POA: Diagnosis not present

## 2019-08-12 DIAGNOSIS — M7711 Lateral epicondylitis, right elbow: Secondary | ICD-10-CM | POA: Diagnosis not present

## 2019-09-10 ENCOUNTER — Encounter: Payer: Self-pay | Admitting: Internal Medicine

## 2019-09-10 ENCOUNTER — Other Ambulatory Visit: Payer: Self-pay

## 2019-09-10 ENCOUNTER — Ambulatory Visit (INDEPENDENT_AMBULATORY_CARE_PROVIDER_SITE_OTHER): Payer: Medicare Other | Admitting: Internal Medicine

## 2019-09-10 VITALS — BP 142/80 | HR 74 | Temp 98.8°F | Resp 12 | Ht 67.0 in | Wt 137.0 lb

## 2019-09-10 DIAGNOSIS — L989 Disorder of the skin and subcutaneous tissue, unspecified: Secondary | ICD-10-CM | POA: Diagnosis not present

## 2019-09-10 DIAGNOSIS — I10 Essential (primary) hypertension: Secondary | ICD-10-CM | POA: Diagnosis not present

## 2019-09-10 DIAGNOSIS — E785 Hyperlipidemia, unspecified: Secondary | ICD-10-CM

## 2019-09-10 DIAGNOSIS — R739 Hyperglycemia, unspecified: Secondary | ICD-10-CM | POA: Diagnosis not present

## 2019-09-10 DIAGNOSIS — K219 Gastro-esophageal reflux disease without esophagitis: Secondary | ICD-10-CM

## 2019-09-10 DIAGNOSIS — F418 Other specified anxiety disorders: Secondary | ICD-10-CM | POA: Diagnosis not present

## 2019-09-10 LAB — HEPATIC FUNCTION PANEL
ALT: 12 U/L (ref 0–53)
AST: 16 U/L (ref 0–37)
Albumin: 4.7 g/dL (ref 3.5–5.2)
Alkaline Phosphatase: 52 U/L (ref 39–117)
Bilirubin, Direct: 0.1 mg/dL (ref 0.0–0.3)
Total Bilirubin: 0.7 mg/dL (ref 0.2–1.2)
Total Protein: 7.2 g/dL (ref 6.0–8.3)

## 2019-09-10 LAB — LIPID PANEL
Cholesterol: 183 mg/dL (ref 0–200)
HDL: 64.1 mg/dL (ref >39.00)
LDL Cholesterol: 97 mg/dL (ref 0–99)
NonHDL: 119.06
Total CHOL/HDL Ratio: 3
Triglycerides: 110 mg/dL (ref 0.0–149.0)
VLDL: 22 mg/dL (ref 0.0–40.0)

## 2019-09-10 LAB — BASIC METABOLIC PANEL
BUN: 15 mg/dL (ref 6–23)
CO2: 31 mEq/L (ref 19–32)
Calcium: 9.6 mg/dL (ref 8.4–10.5)
Chloride: 100 mEq/L (ref 96–112)
Creatinine, Ser: 0.83 mg/dL (ref 0.40–1.50)
GFR: 94.3 mL/min (ref >60.00)
Glucose, Bld: 90 mg/dL (ref 70–99)
Potassium: 4.8 mEq/L (ref 3.5–5.1)
Sodium: 138 mEq/L (ref 135–145)

## 2019-09-10 LAB — CBC WITH DIFFERENTIAL/PLATELET
Basophils Absolute: 0 10*3/uL (ref 0.0–0.1)
Basophils Relative: 0.7 % (ref 0.0–3.0)
Eosinophils Absolute: 0.1 10*3/uL (ref 0.0–0.7)
Eosinophils Relative: 0.8 % (ref 0.0–5.0)
HCT: 43.8 % (ref 39.0–52.0)
Hemoglobin: 15.3 g/dL (ref 13.0–17.0)
Lymphocytes Relative: 20.1 % (ref 12.0–46.0)
Lymphs Abs: 1.5 10*3/uL (ref 0.7–4.0)
MCHC: 34.9 g/dL (ref 30.0–36.0)
MCV: 92.8 fl (ref 78.0–100.0)
Monocytes Absolute: 0.4 10*3/uL (ref 0.1–1.0)
Monocytes Relative: 5.3 % (ref 3.0–12.0)
Neutro Abs: 5.5 10*3/uL (ref 1.4–7.7)
Neutrophils Relative %: 73.1 % (ref 43.0–77.0)
Platelets: 165 10*3/uL (ref 150.0–400.0)
RBC: 4.72 Mil/uL (ref 4.22–5.81)
RDW: 13 % (ref 11.5–15.5)
WBC: 7.6 10*3/uL (ref 4.0–10.5)

## 2019-09-10 MED ORDER — AZITHROMYCIN 250 MG PO TABS: ORAL_TABLET | ORAL | 1 refills | Status: DC

## 2019-09-10 MED ORDER — OMEPRAZOLE 20 MG PO CPDR: DELAYED_RELEASE_CAPSULE | ORAL | 3 refills | Status: DC

## 2019-09-10 NOTE — Patient Instructions (Signed)
Ok to watch the left skin lesion near the left eye, and call for eye doctor referral if getting worse again  Please continue all other medications as before, and refills have been done if requested.- the prilosec  Please have the pharmacy call with any other refills you may need.  Please continue your efforts at being more active, low cholesterol diet, and weight control.  Please keep your appointments with your specialists as you may have planned  Please go to the LAB at the blood drawing area for the tests to be done  You will be contacted by phone if any changes need to be made immediately.  Otherwise, you will receive a letter about your results with an explanation, but please check with MyChart first.  Please remember to sign up for MyChart if you have not done so, as this will be important to you in the future with finding out test results, communicating by private email, and scheduling acute appointments online when needed.  Please return in 6 months, or sooner if needed

## 2019-09-10 NOTE — Progress Notes (Signed)
Subjective:    Patient ID: Edward Chen., male    DOB: [birth year], 58 y.o.   MRN: [id number]  HPI Here to f/u; overall doing ok,  Pt denies chest pain, increasing sob or doe, wheezing, orthopnea, PND, increased LE swelling, palpitations, dizziness or syncope.  Pt denies new neurological symptoms such as new headache, or facial or extremity weakness or numbness.  Pt denies polydipsia, polyuria, or low sugar episode.  Pt states overall good compliance with meds, mostly trying to follow appropriate diet, with wt overall stable,  but little exercise however. Not taking statin recently due to wt loss and better diet.   Also with 2wk onset red bump come up large to start to the medial left eye close to the bridge of the nose, now better, mild tender, and some pain to the area above the left eye as well. No eyelid red, tender, and the spot seemed to start after pulling out a hair, and getting better with no pain now  Also getting cortisone shots to left elbow tennis elbow as well as has bilat CTS  BP at home has been < 140/90.  Denies worsening reflux, abd pain, dysphagia, n/v, bowel change or blood. Denies worsening depressive symptoms, suicidal ideation, or panic; has ongoing anxiety Past Medical History:  Diagnosis Date  . Abnormal drug screen 09/2014   neg xanax (but PRN use), pos EtOH  . Acute asthmatic bronchitis   . Allergic rhinitis   . Allergy   . Anxiety    with h/o attacks "on xanax for chest pain"  . Asthma 11/16/2015  . Benign hypertension   . Borderline diabetes mellitus   . Cervical disc disease 11/16/2015  . Chronic pain syndrome    [facility] - percocets 7.5mg   . Depression   . Depression with anxiety 10/29/2017  . DJD (degenerative joint disease)   . Erectile dysfunction 10/29/2017  . Gastroparesis   . GERD (gastroesophageal reflux disease)   . Habitual alcohol use   . Hyperlipidemia   . Lumbar disc disease 11/16/2015  . Palpitations    Past Surgical History:    Procedure Laterality Date  . APPENDECTOMY    . CERVICAL DISCECTOMY  4/06   decompression, foraminotomy, and fusion Dr. Joya Salm   . COLONOSCOPY  11/2012   1 TA, mod diverticulosis, rpt 5 yrs Deatra Ina)  . EAR CYST EXCISION Right 04/30/2015   Procedure: CYST REMOVAL Right knee;  Surgeon: Ninetta Lights, MD;  Location: [facility];  Service: Orthopedics;  Laterality: Right;  . ESOPHAGOGASTRODUODENOSCOPY  02/2014   gastric polyps, retained gastric residue s/p dilatation Deatra Ina)  . KNEE ARTHROSCOPY WITH LATERAL MENISECTOMY Right 04/30/2015   Procedure: RIGHT KNEE ARTHROSCOPY;  Surgeon: Ninetta Lights, MD;  Location: [facility];  Service: Orthopedics;  Laterality: Right;  . KNEE ARTHROSCOPY WITH MENISCAL REPAIR Right 04/30/2015   Procedure: KNEE ARTHROSCOPY WITH OPEN MENISCAL REPAIR;  Surgeon: Ninetta Lights, MD;  Location: [facility];  Service: Orthopedics;  Laterality: Right;  . SHOULDER SURGERY Right 11/05   Arthroscopy, debridement, distal resection  . SHOULDER SURGERY Left 05/2011   Dr. Percell Miller  . UPPER GASTROINTESTINAL ENDOSCOPY     X3    reports that he has been smoking cigarettes. He has smoked for the past 20.00 years. He has never used smokeless tobacco. He reports current alcohol use of about 12.0 standard drinks of alcohol per week. He reports that he does not use drugs. family history  includes Lung disease in his father. Allergies  Allergen Reactions  . Other Anaphylaxis    BLACKBERRIES  . Peanuts [Peanut Oil] Anaphylaxis  . Shellfish Allergy Anaphylaxis  . Betadine [Povidone Iodine] Other (See Comments)    Due to shellfish allergy   . Lexapro [Escitalopram Oxalate] Other (See Comments)  . Nexium [Esomeprazole Magnesium] Other (See Comments)    Chest pain, does ok with prilosec   Current Outpatient Medications on File Prior to Visit  Medication Sig Dispense Refill  . albuterol (PROVENTIL HFA;VENTOLIN HFA) 108 (90 Base) MCG/ACT  inhaler Inhale 2 puffs into the lungs every 6 (six) hours as needed for wheezing or shortness of breath. 1 Inhaler 11  . ALPRAZolam (XANAX) 0.5 MG tablet Take 1 tablet (0.5 mg total) by mouth daily as needed. 90 tablet 1  . aspirin EC 81 MG tablet Take 81 mg by mouth daily.    . fenofibrate 160 MG tablet Take 1 tablet (160 mg total) by mouth daily. 90 tablet 3  . NON FORMULARY CBD Oil Drops daily    . oxyCODONE-acetaminophen (PERCOCET) 7.5-325 MG per tablet Take as directed by pain management     . sildenafil (VIAGRA) 100 MG tablet take one-half to 1 tablet by mouth daily as needed for erectile dysfunction 20 tablet 5  . baclofen (LIORESAL) 10 MG tablet Take 10 mg by mouth as needed for muscle spasms.    . cetirizine (ZYRTEC) 10 MG tablet Take 1 tablet (10 mg total) by mouth daily. (Patient taking differently: Take 10 mg by mouth as needed. ) 90 tablet 3  . losartan (COZAAR) 100 MG tablet Take 1 tablet (100 mg total) by mouth daily. (Patient not taking: Reported on 09/10/2019) 90 tablet 3  . meloxicam (MOBIC) 15 MG tablet Take 1 tablet (15 mg total) by mouth daily. (Patient not taking: Reported on 09/10/2019) 90 tablet 3   No current facility-administered medications on file prior to visit.   Review of Systems  Constitutional: Negative for other unusual diaphoresis or sweats HENT: Negative for ear discharge or swelling Eyes: Negative for other worsening visual disturbances Respiratory: Negative for stridor or other swelling  Gastrointestinal: Negative for worsening distension or other blood Genitourinary: Negative for retention or other urinary change Musculoskeletal: Negative for other MSK pain or swelling Skin: Negative for color change or other new lesions Neurological: Negative for worsening tremors and other numbness  Psychiatric/Behavioral: Negative for worsening agitation or other fatigue All otherwise neg per pt     Objective:   Physical Exam BP (!) 142/80 Comment: last taken  b/p last week  Pulse 74   Temp 98.8 F (37.1 C)   Resp 12   Ht 5\' 7"  (1.702 m)   Wt 137 lb (62.1 kg)   SpO2 98%   BMI 21.46 kg/m  VS noted,  Constitutional: Pt appears in NAD HENT: Head: NCAT.  Right Ear: External ear normal.  Left Ear: External ear normal.  Eyes: . Pupils are equal, round, and reactive to light. Conjunctivae and EOM are normal Nose: without d/c or deformity Neck: Neck supple. Gross normal ROM Cardiovascular: Normal rate and regular rhythm.   Pulmonary/Chest: Effort normal and breath sounds without rales or wheezing.  Abd:  Soft, NT, ND, + BS, no organomegaly Neurological: Pt is alert. At baseline orientation, motor grossly intact Skin: Skin is warm. No rashes, no LE edema, has small 5 mm raised erythem lesion just medial to left eye near bridge of nose slighty tender Psychiatric: Pt behavior is normal  without agitation  All otherwise neg per pt Lab Results  Component Value Date   WBC 7.6 09/10/2019   HGB 15.3 09/10/2019   HCT 43.8 09/10/2019   PLT 165.0 09/10/2019   GLUCOSE 90 09/10/2019   CHOL 183 09/10/2019   TRIG 110.0 09/10/2019   HDL 64.10 09/10/2019   LDLDIRECT 132.0 11/16/2016   LDLCALC 97 09/10/2019   ALT 12 09/10/2019   AST 16 09/10/2019   NA 138 09/10/2019   K 4.8 09/10/2019   CL 100 09/10/2019   CREATININE 0.83 09/10/2019   BUN 15 09/10/2019   CO2 31 09/10/2019   TSH 1.54 10/31/2018   PSA 0.81 10/31/2018   HGBA1C 5.0 09/10/2019          Assessment & Plan:

## 2019-09-11 ENCOUNTER — Encounter: Payer: Self-pay | Admitting: Internal Medicine

## 2019-09-11 LAB — HEMOGLOBIN A1C: Hgb A1c MFr Bld: 5 % (ref 4.6–6.5)

## 2019-09-12 ENCOUNTER — Other Ambulatory Visit: Payer: Self-pay

## 2019-09-12 DIAGNOSIS — Z20822 Contact with and (suspected) exposure to covid-19: Secondary | ICD-10-CM

## 2019-09-13 ENCOUNTER — Encounter: Payer: Self-pay | Admitting: Internal Medicine

## 2019-09-13 NOTE — Assessment & Plan Note (Signed)
stable overall by history and exam, recent data reviewed with pt, and pt to continue medical treatment as before,  to f/u any worsening symptoms or concerns, for f/u lab 

## 2019-09-13 NOTE — Assessment & Plan Note (Addendum)
C/w non specific irritation, improving, ok to follow  Note:  Total time for pt hx, exam, review of record with pt in the room, determination of diagnoses and plan for further eval and tx is > 40 min, with over 50% spent in coordination and counseling of patient including the differential dx, tx, further evaluation and other management of skin lesion, HLD, HTN, gerd, hyperglycemia, depression anxiety

## 2019-09-13 NOTE — Assessment & Plan Note (Signed)
stable overall by history and exam, recent data reviewed with pt, and pt to continue medical treatment as before,  to f/u any worsening symptoms or concerns  

## 2019-09-14 LAB — NOVEL CORONAVIRUS, NAA: SARS-CoV-2, NAA: NOT DETECTED

## 2019-09-18 ENCOUNTER — Telehealth: Payer: Self-pay | Admitting: Internal Medicine

## 2019-09-18 NOTE — Telephone Encounter (Signed)
Pt called and stated that he would like to know if he should be taking medication fenofibrate 160 MG tablet [id number].

## 2019-09-19 NOTE — Telephone Encounter (Signed)
Pls advise if pt should be taking. Per chart has not had refill since 2/20.Marland Kitchenlmb

## 2019-09-19 NOTE — Telephone Encounter (Signed)
Ok to not take for now, as the Triglycerides were normal just last wk I think without it

## 2019-09-20 NOTE — Telephone Encounter (Signed)
Pt informed of below.  

## 2019-10-10 ENCOUNTER — Telehealth: Payer: Self-pay

## 2019-10-10 DIAGNOSIS — G894 Chronic pain syndrome: Secondary | ICD-10-CM | POA: Diagnosis not present

## 2019-10-10 DIAGNOSIS — K5900 Constipation, unspecified: Secondary | ICD-10-CM | POA: Diagnosis not present

## 2019-10-10 DIAGNOSIS — R0789 Other chest pain: Secondary | ICD-10-CM | POA: Diagnosis not present

## 2019-10-10 NOTE — Telephone Encounter (Signed)
Pt dropped off forms from [place] Dept Certification of Disability Form.  Call pt when completed & ready for pick up [phone]. Placed forms in provider's bin.

## 2019-10-10 NOTE — Telephone Encounter (Signed)
Gave disability form to [place] to be filled out

## 2019-10-11 NOTE — Telephone Encounter (Signed)
Form has been completed &Placed in providers box to review and sign.  

## 2019-10-11 NOTE — Telephone Encounter (Signed)
Forms have been signed, Copy sent to scan.  LVM for patient to inform the original is ready to be picked up. I have not faxed the forms due to he has parts that still need to be completed.

## 2019-11-05 ENCOUNTER — Encounter: Payer: Medicare Other | Admitting: Internal Medicine

## 2019-11-08 ENCOUNTER — Encounter: Payer: Medicare Other | Admitting: Internal Medicine

## 2019-11-12 DIAGNOSIS — Z0279 Encounter for issue of other medical certificate: Secondary | ICD-10-CM

## 2019-11-22 ENCOUNTER — Other Ambulatory Visit: Payer: Self-pay | Admitting: Internal Medicine

## 2019-12-05 DIAGNOSIS — R0789 Other chest pain: Secondary | ICD-10-CM | POA: Diagnosis not present

## 2019-12-05 DIAGNOSIS — G894 Chronic pain syndrome: Secondary | ICD-10-CM | POA: Diagnosis not present

## 2019-12-05 DIAGNOSIS — K5900 Constipation, unspecified: Secondary | ICD-10-CM | POA: Diagnosis not present

## 2019-12-05 DIAGNOSIS — Z79891 Long term (current) use of opiate analgesic: Secondary | ICD-10-CM | POA: Diagnosis not present

## 2019-12-18 ENCOUNTER — Ambulatory Visit (INDEPENDENT_AMBULATORY_CARE_PROVIDER_SITE_OTHER): Payer: Medicare Other | Admitting: Internal Medicine

## 2019-12-18 ENCOUNTER — Encounter: Payer: Self-pay | Admitting: Internal Medicine

## 2019-12-18 DIAGNOSIS — J45909 Unspecified asthma, uncomplicated: Secondary | ICD-10-CM

## 2019-12-18 DIAGNOSIS — F418 Other specified anxiety disorders: Secondary | ICD-10-CM | POA: Diagnosis not present

## 2019-12-18 DIAGNOSIS — R197 Diarrhea, unspecified: Secondary | ICD-10-CM | POA: Diagnosis not present

## 2019-12-18 DIAGNOSIS — R739 Hyperglycemia, unspecified: Secondary | ICD-10-CM

## 2019-12-18 NOTE — Progress Notes (Signed)
Patient ID: Edward Chen., male   DOB: 1961/02/17, 59 y.o.   MRN: [id number]  Virtual Visit via Video Note  I connected with Edward Chen. on 12/18/19 at  1:00 PM EDT by a video enabled telemedicine application and verified that I am speaking with the correct person using two identifiers.  Location: Patient: at home Provider: at office   I discussed the limitations of evaluation and management by telemedicine and the availability of in person appointments. The patient expressed understanding and agreed to proceed.  History of Present Illness: Here with transient n/v, stomach grumbling discomfort, HA and watery loose stools for about 36 hrs after chicken dinner, feels much better today, though still has some grumbling and passing gas.  Also has tender xyphoid after doing situps recently for the past wk, mild, intermittent, but Pt denies chest pain, increased sob or doe, wheezing, orthopnea, PND, increased LE swelling, palpitations, dizziness or syncope.  Pt denies new neurological symptoms such as new headache, or facial or extremity weakness or numbness   Pt denies polydipsia, polyuria, Denies other worsening reflux, abd pain, dysphagia, or blood. Denies worsening depressive symptoms, suicidal ideation, or panic Past Medical History:  Diagnosis Date  . Abnormal drug screen 09/2014   neg xanax (but PRN use), pos EtOH  . Acute asthmatic bronchitis   . Allergic rhinitis   . Allergy   . Anxiety    with h/o attacks "on xanax for chest pain"  . Asthma 11/16/2015  . Benign hypertension   . Borderline diabetes mellitus   . Cervical disc disease 11/16/2015  . Chronic pain syndrome    [facility] - percocets 7.5mg   . Depression   . Depression with anxiety 10/29/2017  . DJD (degenerative joint disease)   . Erectile dysfunction 10/29/2017  . Gastroparesis   . GERD (gastroesophageal reflux disease)   . Habitual alcohol use   . Hyperlipidemia   . Lumbar disc disease 11/16/2015  .  Palpitations    Past Surgical History:  Procedure Laterality Date  . APPENDECTOMY    . CERVICAL DISCECTOMY  4/06   decompression, foraminotomy, and fusion Dr. Joya Salm   . COLONOSCOPY  11/2012   1 TA, mod diverticulosis, rpt 5 yrs Deatra Ina)  . EAR CYST EXCISION Right 04/30/2015   Procedure: CYST REMOVAL Right knee;  Surgeon: Ninetta Lights, MD;  Location: [place];  Service: Orthopedics;  Laterality: Right;  . ESOPHAGOGASTRODUODENOSCOPY  02/2014   gastric polyps, retained gastric residue s/p dilatation Deatra Ina)  . KNEE ARTHROSCOPY WITH LATERAL MENISECTOMY Right 04/30/2015   Procedure: RIGHT KNEE ARTHROSCOPY;  Surgeon: Ninetta Lights, MD;  Location: [facility];  Service: Orthopedics;  Laterality: Right;  . KNEE ARTHROSCOPY WITH MENISCAL REPAIR Right 04/30/2015   Procedure: KNEE ARTHROSCOPY WITH OPEN MENISCAL REPAIR;  Surgeon: Ninetta Lights, MD;  Location: [facility];  Service: Orthopedics;  Laterality: Right;  . SHOULDER SURGERY Right 11/05   Arthroscopy, debridement, distal resection  . SHOULDER SURGERY Left 05/2011   Dr. Percell Miller  . UPPER GASTROINTESTINAL ENDOSCOPY     X3    reports that he has been smoking cigarettes. He has smoked for the past 20.00 years. He has never used smokeless tobacco. He reports current alcohol use of about 12.0 standard drinks of alcohol per week. He reports that he does not use drugs. family history includes Lung disease in his father. Allergies  Allergen Reactions  . Other Anaphylaxis    BLACKBERRIES  .  Peanuts [Peanut Oil] Anaphylaxis  . Shellfish Allergy Anaphylaxis  . Betadine [Povidone Iodine] Other (See Comments)    Due to shellfish allergy   . Lexapro [Escitalopram Oxalate] Other (See Comments)  . Nexium [Esomeprazole Magnesium] Other (See Comments)    Chest pain, does ok with prilosec   Current Outpatient Medications on File Prior to Visit  Medication Sig Dispense Refill  . albuterol (PROVENTIL  HFA;VENTOLIN HFA) 108 (90 Base) MCG/ACT inhaler Inhale 2 puffs into the lungs every 6 (six) hours as needed for wheezing or shortness of breath. 1 Inhaler 11  . ALPRAZolam (XANAX) 0.5 MG tablet Take 1 tablet (0.5 mg total) by mouth daily as needed. 90 tablet 1  . aspirin EC 81 MG tablet Take 81 mg by mouth daily.    Marland Kitchen azithromycin (ZITHROMAX Z-PAK) 250 MG tablet 2 tab by mouth day 1, then 1 per day 6 tablet 1  . baclofen (LIORESAL) 10 MG tablet Take 10 mg by mouth as needed for muscle spasms.    . cetirizine (ZYRTEC) 10 MG tablet Take 1 tablet (10 mg total) by mouth daily. (Patient taking differently: Take 10 mg by mouth as needed. ) 90 tablet 3  . fenofibrate 160 MG tablet Take 1 tablet (160 mg total) by mouth daily. 90 tablet 3  . losartan (COZAAR) 100 MG tablet Take 1 tablet (100 mg total) by mouth daily. (Patient not taking: Reported on 09/10/2019) 90 tablet 3  . meloxicam (MOBIC) 15 MG tablet Take 1 tablet (15 mg total) by mouth daily. (Patient not taking: Reported on 09/10/2019) 90 tablet 3  . NON FORMULARY CBD Oil Drops daily    . omeprazole (PRILOSEC) 20 MG capsule TAKE 1 CAPSULE BY MOUTH EVERY DAY 90 capsule 3  . oxyCODONE-acetaminophen (PERCOCET) 7.5-325 MG per tablet Take as directed by pain management     . sildenafil (VIAGRA) 100 MG tablet Take 1/2 to 1 tablet by mouth daily as needed for erectile dysfunction. 20 tablet 4   No current facility-administered medications on file prior to visit.    Observations/Objective: Alert, NAD, appropriate mood and affect, resps normal, cn 2-12 intact, moves all 4s, no visible rash or swelling Lab Results  Component Value Date   WBC 7.6 09/10/2019   HGB 15.3 09/10/2019   HCT 43.8 09/10/2019   PLT 165.0 09/10/2019   GLUCOSE 90 09/10/2019   CHOL 183 09/10/2019   TRIG 110.0 09/10/2019   HDL 64.10 09/10/2019   LDLDIRECT 132.0 11/16/2016   LDLCALC 97 09/10/2019   ALT 12 09/10/2019   AST 16 09/10/2019   NA 138 09/10/2019   K 4.8 09/10/2019    CL 100 09/10/2019   CREATININE 0.83 09/10/2019   BUN 15 09/10/2019   CO2 31 09/10/2019   TSH 1.54 10/31/2018   PSA 0.81 10/31/2018   HGBA1C 5.0 09/10/2019   Assessment and Plan: See notes  Follow Up Instructions: See notes   I discussed the assessment and treatment plan with the patient. The patient was provided an opportunity to ask questions and all were answered. The patient agreed with the plan and demonstrated an understanding of the instructions.   The patient was advised to call back or seek an in-person evaluation if the symptoms worsen or if the condition fails to improve as anticipated.  Cathlean Cower, MD

## 2019-12-18 NOTE — Assessment & Plan Note (Signed)
stable overall by history and exam, recent data reviewed with pt, and pt to continue medical treatment as before,  to f/u any worsening symptoms or concerns  

## 2019-12-18 NOTE — Assessment & Plan Note (Addendum)
Likely food poisoning, now near resolved, educated, reassured,  to f/u any worsening symptoms or concerns  I spent 31 minutes in preparing to see the patient by review of recent labs, imaging and procedures, obtaining and reviewing separately obtained history, communicating with the patient and family or caregiver, ordering medications, tests or procedures, and documenting clinical information in the EHR including the differential Dx, treatment, and any further evaluation and other management of diarrhea, hyperglycemia, depression, asthma

## 2019-12-18 NOTE — Patient Instructions (Signed)
Please continue all other medications as before, and refills have been done if requested.  Please have the pharmacy call with any other refills you may need.  Please continue your efforts at being more active, low cholesterol diet, and weight control.  Please keep your appointments with your specialists as you may have planned     

## 2020-01-10 ENCOUNTER — Ambulatory Visit: Payer: Medicare Other

## 2020-01-10 ENCOUNTER — Telehealth: Payer: Self-pay

## 2020-01-10 NOTE — Telephone Encounter (Signed)
Patient was scheduled for a televisit Medicare Wellness Visit.  Called patient, no answer; mailbox full not able to leave message.  Appoint was canceled and recorded as a No Show.

## 2020-01-27 DIAGNOSIS — R0789 Other chest pain: Secondary | ICD-10-CM | POA: Diagnosis not present

## 2020-01-27 DIAGNOSIS — G894 Chronic pain syndrome: Secondary | ICD-10-CM | POA: Diagnosis not present

## 2020-01-27 DIAGNOSIS — K5900 Constipation, unspecified: Secondary | ICD-10-CM | POA: Diagnosis not present

## 2020-02-04 ENCOUNTER — Other Ambulatory Visit: Payer: Self-pay | Admitting: Internal Medicine

## 2020-03-10 ENCOUNTER — Ambulatory Visit (INDEPENDENT_AMBULATORY_CARE_PROVIDER_SITE_OTHER): Payer: Medicare Other | Admitting: Internal Medicine

## 2020-03-10 ENCOUNTER — Encounter: Payer: Self-pay | Admitting: Internal Medicine

## 2020-03-10 ENCOUNTER — Other Ambulatory Visit: Payer: Self-pay

## 2020-03-10 VITALS — BP 140/80 | HR 65 | Temp 98.1°F | Ht 67.0 in | Wt 144.0 lb

## 2020-03-10 DIAGNOSIS — E538 Deficiency of other specified B group vitamins: Secondary | ICD-10-CM | POA: Diagnosis not present

## 2020-03-10 DIAGNOSIS — R739 Hyperglycemia, unspecified: Secondary | ICD-10-CM | POA: Diagnosis not present

## 2020-03-10 DIAGNOSIS — M7711 Lateral epicondylitis, right elbow: Secondary | ICD-10-CM | POA: Diagnosis not present

## 2020-03-10 DIAGNOSIS — Z0000 Encounter for general adult medical examination without abnormal findings: Secondary | ICD-10-CM | POA: Diagnosis not present

## 2020-03-10 DIAGNOSIS — E559 Vitamin D deficiency, unspecified: Secondary | ICD-10-CM | POA: Diagnosis not present

## 2020-03-10 NOTE — Patient Instructions (Addendum)

## 2020-03-10 NOTE — Assessment & Plan Note (Signed)
conts to work with ortho, may need MRI next to left or right elbosw

## 2020-03-10 NOTE — Assessment & Plan Note (Signed)

## 2020-03-10 NOTE — Progress Notes (Signed)
Subjective:    Patient ID: Edward Horns., male    DOB: 12-15-1960, 59 y.o.   MRN: [id number]  HPI  Here for wellness and f/u;  Overall doing ok;  Pt denies Chest pain, worsening SOB, DOE, wheezing, orthopnea, PND, worsening LE edema, palpitations, dizziness or syncope.  Pt denies neurological change such as new headache, facial or extremity weakness.  Pt denies polydipsia, polyuria, or low sugar symptoms. Pt states overall good compliance with treatment and medications, good tolerability, and has been trying to follow appropriate diet.  Pt denies worsening depressive symptoms, suicidal ideation or panic. No fever, night sweats, wt loss, loss of appetite, or other constitutional symptoms.  Pt states good ability with ADL's, has low fall risk, home safety reviewed and adequate, no other significant changes in hearing or vision, and only occasionally active with exercise. No new complaints, just got married 2 mo ago for second.  Past Medical History:  Diagnosis Date  . Abnormal drug screen 09/2014   neg xanax (but PRN use), pos EtOH  . Acute asthmatic bronchitis   . Allergic rhinitis   . Allergy   . Anxiety    with h/o attacks "on xanax for chest pain"  . Asthma 11/16/2015  . Benign hypertension   . Borderline diabetes mellitus   . Cervical disc disease 11/16/2015  . Chronic pain syndrome    [facility] - percocets 7.5mg   . Depression   . Depression with anxiety 10/29/2017  . DJD (degenerative joint disease)   . Erectile dysfunction 10/29/2017  . Gastroparesis   . GERD (gastroesophageal reflux disease)   . Habitual alcohol use   . Hyperlipidemia   . Lumbar disc disease 11/16/2015  . Palpitations    Past Surgical History:  Procedure Laterality Date  . APPENDECTOMY    . CERVICAL DISCECTOMY  4/06   decompression, foraminotomy, and fusion Dr. Joya Salm   . COLONOSCOPY  11/2012   1 TA, mod diverticulosis, rpt 5 yrs Deatra Ina)  . EAR CYST EXCISION Right 04/30/2015   Procedure: CYST  REMOVAL Right knee;  Surgeon: Ninetta Lights, MD;  Location: [place];  Service: Orthopedics;  Laterality: Right;  . ESOPHAGOGASTRODUODENOSCOPY  02/2014   gastric polyps, retained gastric residue s/p dilatation Deatra Ina)  . KNEE ARTHROSCOPY WITH LATERAL MENISECTOMY Right 04/30/2015   Procedure: RIGHT KNEE ARTHROSCOPY;  Surgeon: Ninetta Lights, MD;  Location: [place];  Service: Orthopedics;  Laterality: Right;  . KNEE ARTHROSCOPY WITH MENISCAL REPAIR Right 04/30/2015   Procedure: KNEE ARTHROSCOPY WITH OPEN MENISCAL REPAIR;  Surgeon: Ninetta Lights, MD;  Location: [place];  Service: Orthopedics;  Laterality: Right;  . SHOULDER SURGERY Right 11/05   Arthroscopy, debridement, distal resection  . SHOULDER SURGERY Left 05/2011   Dr. Percell Miller  . UPPER GASTROINTESTINAL ENDOSCOPY     X3    reports that he has been smoking cigarettes. He has smoked for the past 20.00 years. He has never used smokeless tobacco. He reports current alcohol use of about 12.0 standard drinks of alcohol per week. He reports that he does not use drugs. family history includes Lung disease in his father. Allergies  Allergen Reactions  . Other Anaphylaxis    BLACKBERRIES  . Peanuts [Peanut Oil] Anaphylaxis  . Shellfish Allergy Anaphylaxis  . Betadine [Povidone Iodine] Other (See Comments)    Due to shellfish allergy   . Lexapro [Escitalopram Oxalate] Other (See Comments)  . Nexium [Esomeprazole Magnesium] Other (See Comments)  Chest pain, does ok with prilosec   Current Outpatient Medications on File Prior to Visit  Medication Sig Dispense Refill  . albuterol (PROVENTIL HFA;VENTOLIN HFA) 108 (90 Base) MCG/ACT inhaler Inhale 2 puffs into the lungs every 6 (six) hours as needed for wheezing or shortness of breath. 1 Inhaler 11  . ALPRAZolam (XANAX) 0.5 MG tablet Take 1 tablet (0.5 mg total) by mouth daily as needed. 90 tablet 1  . aspirin EC 81 MG tablet Take 81 mg by  mouth daily.    Marland Kitchen azithromycin (ZITHROMAX Z-PAK) 250 MG tablet 2 tab by mouth day 1, then 1 per day 6 tablet 1  . baclofen (LIORESAL) 10 MG tablet Take 10 mg by mouth as needed for muscle spasms.    Marland Kitchen omeprazole (PRILOSEC) 20 MG capsule TAKE 1 CAPSULE BY MOUTH EVERY DAY 90 capsule 3  . oxyCODONE-acetaminophen (PERCOCET) 7.5-325 MG per tablet Take as directed by pain management     . sildenafil (VIAGRA) 100 MG tablet Take 1/2 to 1 tablet by mouth daily as needed for erectile dysfunction. 20 tablet 4   No current facility-administered medications on file prior to visit.   Review of Systems All otherwise neg per pt    Objective:   Physical Exam BP 140/80 (BP Location: Left Arm, Patient Position: Sitting, Cuff Size: Large)   Pulse 65   Temp 98.1 F (36.7 C) (Oral)   Ht 5\' 7"  (1.702 m)   Wt 144 lb (65.3 kg)   SpO2 98%   BMI 22.55 kg/m  VS noted,  Constitutional: Pt appears in NAD HENT: Head: NCAT.  Right Ear: External ear normal.  Left Ear: External ear normal.  Eyes: . Pupils are equal, round, and reactive to light. Conjunctivae and EOM are normal Nose: without d/c or deformity Neck: Neck supple. Gross normal ROM Cardiovascular: Normal rate and regular rhythm.   Pulmonary/Chest: Effort normal and breath sounds without rales or wheezing.  Abd:  Soft, NT, ND, + BS, no organomegaly Neurological: Pt is alert. At baseline orientation, motor grossly intact Skin: Skin is warm. No rashes, other new lesions, no LE edema Psychiatric: Pt behavior is normal without agitation  All otherwise neg per pt Lab Results  Component Value Date   WBC 7.6 09/10/2019   HGB 15.3 09/10/2019   HCT 43.8 09/10/2019   PLT 165.0 09/10/2019   GLUCOSE 90 09/10/2019   CHOL 183 09/10/2019   TRIG 110.0 09/10/2019   HDL 64.10 09/10/2019   LDLDIRECT 132.0 11/16/2016   LDLCALC 97 09/10/2019   ALT 12 09/10/2019   AST 16 09/10/2019   NA 138 09/10/2019   K 4.8 09/10/2019   CL 100 09/10/2019   CREATININE 0.83  09/10/2019   BUN 15 09/10/2019   CO2 31 09/10/2019   TSH 1.54 10/31/2018   PSA 0.81 10/31/2018   HGBA1C 5.0 09/10/2019         Assessment & Plan:

## 2020-03-10 NOTE — Assessment & Plan Note (Signed)
stable overall by history and exam, recent data reviewed with pt, and pt to continue medical treatment as before,  to f/u any worsening symptoms or concerns  

## 2020-03-10 NOTE — Addendum Note (Signed)
Addended by: Trenda Moots on: 7/35/6701 11:09 AM   Modules accepted: Orders

## 2020-03-13 ENCOUNTER — Other Ambulatory Visit: Payer: Medicare Other

## 2020-03-13 ENCOUNTER — Other Ambulatory Visit: Payer: Self-pay | Admitting: Internal Medicine

## 2020-03-13 LAB — CBC WITH DIFFERENTIAL/PLATELET
Basophils Absolute: 0.1 10*3/uL (ref 0.0–0.1)
Basophils Relative: 0.7 % (ref 0.0–3.0)
Eosinophils Absolute: 0.2 10*3/uL (ref 0.0–0.7)
Eosinophils Relative: 2.5 % (ref 0.0–5.0)
HCT: 40.8 % (ref 39.0–52.0)
Hemoglobin: 14.6 g/dL (ref 13.0–17.0)
Lymphocytes Relative: 32 % (ref 12.0–46.0)
Lymphs Abs: 2.2 10*3/uL (ref 0.7–4.0)
MCHC: 35.8 g/dL (ref 30.0–36.0)
MCV: 91.6 fl (ref 78.0–100.0)
Monocytes Absolute: 0.4 10*3/uL (ref 0.1–1.0)
Monocytes Relative: 6.3 % (ref 3.0–12.0)
Neutro Abs: 4 10*3/uL (ref 1.4–7.7)
Neutrophils Relative %: 58.5 % (ref 43.0–77.0)
Platelets: 167 10*3/uL (ref 150.0–400.0)
RBC: 4.46 Mil/uL (ref 4.22–5.81)
RDW: 13.2 % (ref 11.5–15.5)
WBC: 6.9 10*3/uL (ref 4.0–10.5)

## 2020-03-13 LAB — URINALYSIS, ROUTINE W REFLEX MICROSCOPIC
Bilirubin Urine: NEGATIVE
Hgb urine dipstick: NEGATIVE
Ketones, ur: NEGATIVE
Leukocytes,Ua: NEGATIVE
Nitrite: NEGATIVE
RBC / HPF: NONE SEEN (ref 0–?)
Specific Gravity, Urine: 1.02 (ref 1.000–1.030)
Total Protein, Urine: NEGATIVE
Urine Glucose: NEGATIVE
Urobilinogen, UA: 0.2 (ref 0.0–1.0)
WBC, UA: NONE SEEN (ref 0–?)
pH: 6.5 (ref 5.0–8.0)

## 2020-03-13 LAB — VITAMIN B12: Vitamin B-12: 204 pg/mL — ABNORMAL LOW (ref 211–911)

## 2020-03-13 LAB — BASIC METABOLIC PANEL
BUN: 20 mg/dL (ref 6–23)
CO2: 28 mEq/L (ref 19–32)
Calcium: 9.3 mg/dL (ref 8.4–10.5)
Chloride: 103 mEq/L (ref 96–112)
Creatinine, Ser: 1.17 mg/dL (ref 0.40–1.50)
GFR: 63.35 mL/min (ref >60.00)
Glucose, Bld: 110 mg/dL — ABNORMAL HIGH (ref 70–99)
Potassium: 4.1 mEq/L (ref 3.5–5.1)
Sodium: 139 mEq/L (ref 135–145)

## 2020-03-13 LAB — LIPID PANEL
Cholesterol: 194 mg/dL (ref 0–200)
HDL: 48.6 mg/dL (ref >39.00)
LDL Cholesterol: 120 mg/dL — ABNORMAL HIGH (ref 0–99)
NonHDL: 145.15
Total CHOL/HDL Ratio: 4
Triglycerides: 126 mg/dL (ref 0.0–149.0)
VLDL: 25.2 mg/dL (ref 0.0–40.0)

## 2020-03-13 LAB — HEPATIC FUNCTION PANEL
ALT: 10 U/L (ref 0–53)
AST: 14 U/L (ref 0–37)
Albumin: 4.6 g/dL (ref 3.5–5.2)
Alkaline Phosphatase: 64 U/L (ref 39–117)
Bilirubin, Direct: 0.1 mg/dL (ref 0.0–0.3)
Total Bilirubin: 0.7 mg/dL (ref 0.2–1.2)
Total Protein: 7.1 g/dL (ref 6.0–8.3)

## 2020-03-13 LAB — HEMOGLOBIN A1C: Hgb A1c MFr Bld: 4.9 % (ref 4.6–6.5)

## 2020-03-13 LAB — TSH: TSH: 1.85 u[IU]/mL (ref 0.35–4.50)

## 2020-03-13 LAB — PSA: PSA: 0.63 ng/mL (ref 0.10–4.00)

## 2020-03-13 LAB — VITAMIN D 25 HYDROXY (VIT D DEFICIENCY, FRACTURES): VITD: 39.72 ng/mL (ref 30.00–100.00)

## 2020-03-13 MED ORDER — VITAMIN B-12 1000 MCG PO TABS: 1000.0000 ug | ORAL_TABLET | Freq: Every day | ORAL | 2 refills | Status: DC

## 2020-03-18 ENCOUNTER — Telehealth: Payer: Self-pay | Admitting: Internal Medicine

## 2020-03-18 NOTE — Telephone Encounter (Signed)
° ° °  Patient calling to discuss lab results and Vitamin B12

## 2020-03-19 NOTE — Telephone Encounter (Signed)
Spoke with Edward Chen about his blood work and Edward Chen has stated that the lab called him to come back into the lab to redraw his labs due to an error with labeling. I have informed Dr. Jenny Reichmann of the pts concerns and to see if the blood work from 03/13/2020 has been released for resulting instead of 03/10/2020 labs.

## 2020-03-19 NOTE — Telephone Encounter (Signed)
There is none from July 2 on the chart  Please call lab to clarify and get them released if not released yet

## 2020-03-20 NOTE — Telephone Encounter (Signed)
Spoke with pt and informed him that the blood work he was given the results for are from the redrawn labs that was done on 03/13/2020.

## 2020-03-25 ENCOUNTER — Other Ambulatory Visit: Payer: Self-pay | Admitting: Orthopedic Surgery

## 2020-03-25 DIAGNOSIS — M25522 Pain in left elbow: Secondary | ICD-10-CM

## 2020-03-25 DIAGNOSIS — M25521 Pain in right elbow: Secondary | ICD-10-CM

## 2020-04-03 DIAGNOSIS — G894 Chronic pain syndrome: Secondary | ICD-10-CM | POA: Diagnosis not present

## 2020-04-03 DIAGNOSIS — K5900 Constipation, unspecified: Secondary | ICD-10-CM | POA: Diagnosis not present

## 2020-04-03 DIAGNOSIS — R0789 Other chest pain: Secondary | ICD-10-CM | POA: Diagnosis not present

## 2020-04-17 ENCOUNTER — Ambulatory Visit
Admission: RE | Admit: 2020-04-17 | Discharge: 2020-04-17 | Disposition: A | Discharge status: Home / Self Care | Payer: Medicare Other | Source: Ambulatory Visit | Attending: Orthopedic Surgery | Admitting: Orthopedic Surgery

## 2020-04-17 ENCOUNTER — Other Ambulatory Visit: Payer: Self-pay

## 2020-04-17 DIAGNOSIS — M25522 Pain in left elbow: Secondary | ICD-10-CM

## 2020-04-17 DIAGNOSIS — M25521 Pain in right elbow: Secondary | ICD-10-CM

## 2020-04-28 DIAGNOSIS — R936 Abnormal findings on diagnostic imaging of limbs: Secondary | ICD-10-CM | POA: Diagnosis not present

## 2020-04-28 DIAGNOSIS — M6799 Unspecified disorder of synovium and tendon, multiple sites: Secondary | ICD-10-CM | POA: Diagnosis not present

## 2020-05-04 DIAGNOSIS — M7711 Lateral epicondylitis, right elbow: Secondary | ICD-10-CM | POA: Diagnosis not present

## 2020-06-04 DIAGNOSIS — G894 Chronic pain syndrome: Secondary | ICD-10-CM | POA: Diagnosis not present

## 2020-06-04 DIAGNOSIS — K5900 Constipation, unspecified: Secondary | ICD-10-CM | POA: Diagnosis not present

## 2020-06-04 DIAGNOSIS — R0789 Other chest pain: Secondary | ICD-10-CM | POA: Diagnosis not present

## 2020-08-03 DIAGNOSIS — R0789 Other chest pain: Secondary | ICD-10-CM | POA: Diagnosis not present

## 2020-08-03 DIAGNOSIS — K5900 Constipation, unspecified: Secondary | ICD-10-CM | POA: Diagnosis not present

## 2020-08-03 DIAGNOSIS — G894 Chronic pain syndrome: Secondary | ICD-10-CM | POA: Diagnosis not present

## 2020-09-09 ENCOUNTER — Ambulatory Visit: Payer: Medicare Other | Admitting: Internal Medicine

## 2020-09-14 ENCOUNTER — Ambulatory Visit (INDEPENDENT_AMBULATORY_CARE_PROVIDER_SITE_OTHER): Payer: Medicare Other

## 2020-09-14 DIAGNOSIS — Z0000 Encounter for general adult medical examination without abnormal findings: Secondary | ICD-10-CM

## 2020-09-14 NOTE — Progress Notes (Signed)
I connected with Edward Chen today by telephone and verified that I am speaking with the correct person using two identifiers. Location patient: home Location provider: work Persons participating in the virtual visit: patient, provider.   I discussed the limitations, risks, security and privacy concerns of performing an evaluation and management service by telephone and the availability of in person appointments. I also discussed with the patient that there may be a patient responsible charge related to this service. The patient expressed understanding and verbally consented to this telephonic visit.    Interactive audio and video telecommunications were attempted between this provider and patient, however failed, due to patient having technical difficulties OR patient did not have access to video capability.  We continued and completed visit with audio only.  Some vital signs may be absent or patient reported.   Time Spent with patient on telephone encounter: 30 minutes  Subjective:   Edward Monley. is a 60 y.o. male who presents for Medicare Annual/Subsequent preventive examination.  Review of Systems    No ROS. Medicare Wellness Visit. Additional risk factors are reflected in social history. Cardiac Risk Factors include: advanced age (>67men, >62 women);dyslipidemia;hypertension;male gender;smoking/ tobacco exposure     Objective:    There were no vitals filed for this visit. There is no height or weight on file to calculate BMI.  Advanced Directives 09/14/2020 04/30/2015 04/28/2015 02/14/2014  Does Patient Have a Medical Advance Directive? No No No Patient does not have advance directive  Would patient like information on creating a medical advance directive? No - Patient declined No - patient declined information - -    Current Medications (verified) Outpatient Encounter Medications as of 09/14/2020  Medication Sig  . albuterol (PROVENTIL HFA;VENTOLIN HFA) 108 (90 Base)  MCG/ACT inhaler Inhale 2 puffs into the lungs every 6 (six) hours as needed for wheezing or shortness of breath.  . ALPRAZolam (XANAX) 0.5 MG tablet Take 1 tablet (0.5 mg total) by mouth daily as needed.  Marland Kitchen aspirin EC 81 MG tablet Take 81 mg by mouth daily.  Marland Kitchen azithromycin (ZITHROMAX Z-PAK) 250 MG tablet 2 tab by mouth day 1, then 1 per day  . baclofen (LIORESAL) 10 MG tablet Take 10 mg by mouth as needed for muscle spasms.  Marland Kitchen omeprazole (PRILOSEC) 20 MG capsule TAKE 1 CAPSULE BY MOUTH EVERY DAY  . oxyCODONE-acetaminophen (PERCOCET) 7.5-325 MG per tablet Take as directed by pain management  . sildenafil (VIAGRA) 100 MG tablet Take 1/2 to 1 tablet by mouth daily as needed for erectile dysfunction.  . vitamin B-12 (CYANOCOBALAMIN) 1000 MCG tablet Take 1 tablet (1,000 mcg total) by mouth daily.   No facility-administered encounter medications on file as of 09/14/2020.    Allergies (verified) Other, Peanuts [peanut oil], Shellfish allergy, Betadine [povidone iodine], Lexapro [escitalopram oxalate], and Nexium [esomeprazole magnesium]   History: Past Medical History:  Diagnosis Date  . Abnormal drug screen 09/2014   neg xanax (but PRN use), pos EtOH  . Acute asthmatic bronchitis   . Allergic rhinitis   . Allergy   . Anxiety    with h/o attacks "on xanax for chest pain"  . Asthma 11/16/2015  . Benign hypertension   . Borderline diabetes mellitus   . Cervical disc disease 11/16/2015  . Chronic pain syndrome    [facility] - percocets 7.5mg   . Depression   . Depression with anxiety 10/29/2017  . DJD (degenerative joint disease)   . Erectile dysfunction 10/29/2017  . Gastroparesis   .  GERD (gastroesophageal reflux disease)   . Habitual alcohol use   . Hyperlipidemia   . Lumbar disc disease 11/16/2015  . Palpitations    Past Surgical History:  Procedure Laterality Date  . APPENDECTOMY    . CERVICAL DISCECTOMY  4/06   decompression, foraminotomy, and fusion Dr. Joya Salm   .  COLONOSCOPY  11/2012   1 TA, mod diverticulosis, rpt 5 yrs Deatra Ina)  . EAR CYST EXCISION Right 04/30/2015   Procedure: CYST REMOVAL Right knee;  Surgeon: Ninetta Lights, MD;  Location: [place];  Service: Orthopedics;  Laterality: Right;  . ESOPHAGOGASTRODUODENOSCOPY  02/2014   gastric polyps, retained gastric residue s/p dilatation Deatra Ina)  . KNEE ARTHROSCOPY WITH LATERAL MENISECTOMY Right 04/30/2015   Procedure: RIGHT KNEE ARTHROSCOPY;  Surgeon: Ninetta Lights, MD;  Location: [place];  Service: Orthopedics;  Laterality: Right;  . KNEE ARTHROSCOPY WITH MENISCAL REPAIR Right 04/30/2015   Procedure: KNEE ARTHROSCOPY WITH OPEN MENISCAL REPAIR;  Surgeon: Ninetta Lights, MD;  Location: [place];  Service: Orthopedics;  Laterality: Right;  . SHOULDER SURGERY Right 11/05   Arthroscopy, debridement, distal resection  . SHOULDER SURGERY Left 05/2011   Dr. Percell Miller  . UPPER GASTROINTESTINAL ENDOSCOPY     X3   Family History  Problem Relation Age of Onset  . Lung disease Father        smoker  . Cancer Neg Hx   . CAD Neg Hx   . Stroke Neg Hx   . Diabetes Neg Hx   . Colon cancer Neg Hx   . Esophageal cancer Neg Hx   . Stomach cancer Neg Hx   . Rectal cancer Neg Hx    Social History   Socioeconomic History  . Marital status: Married    Spouse name: Willet Tangonan  . Number of children: Not on file  . Years of education: Not on file  . Highest education level: Not on file  Occupational History  . Occupation: retired on disability  Tobacco Use  . Smoking status: Current Some Day Smoker    Years: 20.00    Types: Cigarettes  . Smokeless tobacco: Never Used  . Tobacco comment: smokes a couple of times a week  Substance and Sexual Activity  . Alcohol use: Yes    Alcohol/week: 12.0 standard drinks    Types: 12 Cans of beer per week    Comment: DRINKS ON WEEKENDS  . Drug use: No  . Sexual activity: Not on file    Comment: smokes when  drinking  Other Topics Concern  . Not on file  Social History Narrative   On disability for chronic lower back pain followed by pain clinic   Social Determinants of Health   Financial Resource Strain: Low Risk   . Difficulty of Paying Living Expenses: Not hard at all  Food Insecurity: No Food Insecurity  . Worried About Charity fundraiser in the Last Year: Never true  . Ran Out of Food in the Last Year: Never true  Transportation Needs: No Transportation Needs  . Lack of Transportation (Medical): No  . Lack of Transportation (Non-Medical): No  Physical Activity: Inactive  . Days of Exercise per Week: 0 days  . Minutes of Exercise per Session: 0 min  Stress: No Stress Concern Present  . Feeling of Stress : Not at all  Social Connections: Not on file    Tobacco Counseling Ready to quit: Not Answered Counseling given: Not Answered Comment: smokes  a couple of times a week   Clinical Intake:  Pre-visit preparation completed: Yes  Pain : No/denies pain     Nutritional Risks: None Diabetes: No  How often do you need to have someone help you when you read instructions, pamphlets, or other written materials from your doctor or pharmacy?: 1 - Never What is the last grade level you completed in school?: 8th grade  Diabetic? no  Interpreter Needed?: No  Information entered by :: Lisette Abu, LPN   Activities of Daily Living In your present state of health, do you have any difficulty performing the following activities: 09/14/2020 03/10/2020  Hearing? N N  Vision? N N  Difficulty concentrating or making decisions? N N  Walking or climbing stairs? N N  Dressing or bathing? N N  Doing errands, shopping? N N  Preparing Food and eating ? N -  Using the Toilet? N -  In the past six months, have you accidently leaked urine? N -  Do you have problems with loss of bowel control? N -  Managing your Medications? N -  Managing your Finances? N -  Housekeeping or managing  your Housekeeping? N -  Some recent data might be hidden    Patient Care Team: Biagio Borg, MD as PCP - General (Internal Medicine)  Indicate any recent Medical Services you may have received from other than [facility] providers in the past year (date may be approximate).     Assessment:   This is a routine wellness examination for Eusebio.  Hearing/Vision screen No exam data present  Dietary issues and exercise activities discussed: Current Exercise Habits: The patient does not participate in regular exercise at present, Exercise limited by: orthopedic condition(s);respiratory conditions(s)  Goals   None    Depression Screen PHQ 2/9 Scores 09/14/2020 03/10/2020 03/10/2020 09/10/2019 10/31/2018 10/26/2017 11/16/2016  PHQ - 2 Score 0 0 0 0 0 2 0  PHQ- 9 Score - - 0 - - - -    Fall Risk Fall Risk  09/14/2020 03/10/2020 03/10/2020 09/10/2019 10/31/2018  Falls in the past year? 0 0 0 0 0  Number falls in past yr: 0 - 0 0 -  Injury with Fall? 0 - 0 0 -  Risk for fall due to : No Fall Risks - No Fall Risks - -  Follow up Falls evaluation completed - Falls evaluation completed - -    FALL RISK PREVENTION PERTAINING TO THE HOME:  Any stairs in or around the home? No  If so, are there any without handrails? No  Home free of loose throw rugs in walkways, pet beds, electrical cords, etc? Yes  Adequate lighting in your home to reduce risk of falls? Yes   ASSISTIVE DEVICES UTILIZED TO PREVENT FALLS:  Life alert? No  Use of a cane, walker or w/c? No  Grab bars in the bathroom? Yes  Shower chair or bench in shower? Yes  Elevated toilet seat or a handicapped toilet? No   TIMED UP AND GO:  Was the test performed? No .  Length of time to ambulate 10 feet: 0 sec.   Gait steady and fast without use of assistive device  Cognitive Function: No flowsheet data found.        Immunizations Immunization History  Administered Date(s) Administered  . Influenza Whole 06/12/2004  . PFIZER SARS-COV-2  Vaccination 12/30/2019, 01/20/2020  . Pneumococcal Polysaccharide-23 10/12/2009  . Td 09/13/2011    TDAP status: Due, Education has been provided regarding the  importance of this vaccine. Advised may receive this vaccine at local pharmacy or Health Dept. Aware to provide a copy of the vaccination record if obtained from local pharmacy or Health Dept. Verbalized acceptance and understanding.  Flu Vaccine status: Declined, Education has been provided regarding the importance of this vaccine but patient still declined. Advised may receive this vaccine at local pharmacy or Health Dept. Aware to provide a copy of the vaccination record if obtained from local pharmacy or Health Dept. Verbalized acceptance and understanding.  Pneumococcal vaccine status: Declined,  Education has been provided regarding the importance of this vaccine but patient still declined. Advised may receive this vaccine at local pharmacy or Health Dept. Aware to provide a copy of the vaccination record if obtained from local pharmacy or Health Dept. Verbalized acceptance and understanding.   Covid-19 vaccine status: Completed vaccines  Qualifies for Shingles Vaccine? Yes   Zostavax completed No   Shingrix Completed?: No.    Education has been provided regarding the importance of this vaccine. Patient has been advised to call insurance company to determine out of pocket expense if they have not yet received this vaccine. Advised may also receive vaccine at local pharmacy or Health Dept. Verbalized acceptance and understanding.  Screening Tests Health Maintenance  Topic Date Due  . INFLUENZA VACCINE  04/12/2020  . COVID-19 Vaccine (3 - Booster for Pfizer series) 07/22/2020  . TETANUS/TDAP  09/12/2021  . COLONOSCOPY (Pts 45-63yrs Insurance coverage will need to be confirmed)  10/05/2027  . Hepatitis C Screening  Completed  . HIV Screening  Completed    Health Maintenance  Health Maintenance Due  Topic Date Due  .  INFLUENZA VACCINE  04/12/2020  . COVID-19 Vaccine (3 - Booster for Pfizer series) 07/22/2020    Colorectal cancer screening: Type of screening: Colonoscopy. Completed 10/04/2017. Repeat every 10 years  Lung Cancer Screening: (Low Dose CT Chest recommended if Age 28-80 years, 30 pack-year currently smoking OR have quit w/in 15years.) does qualify.   Lung Cancer Screening Referral: no  Additional Screening:  Hepatitis C Screening: does qualify; Completed yes  Vision Screening: Recommended annual ophthalmology exams for early detection of glaucoma and other disorders of the eye. Is the patient up to date with their annual eye exam?  No  Who is the provider or what is the name of the office in which the patient attends annual eye exams? Patient refused eye exam If pt is not established with a provider, would they like to be referred to a provider to establish care? No .   Dental Screening: Recommended annual dental exams for proper oral hygiene  Community Resource Referral / Chronic Care Management: CRR required this visit?  No   CCM required this visit?  No      Plan:     I have personally reviewed and noted the following in the patient's chart:   . Medical and social history . Use of alcohol, tobacco or illicit drugs  . Current medications and supplements . Functional ability and status . Nutritional status . Physical activity . Advanced directives . List of other physicians . Hospitalizations, surgeries, and ER visits in previous 12 months . Vitals . Screenings to include cognitive, depression, and falls . Referrals and appointments  In addition, I have reviewed and discussed with patient certain preventive protocols, quality metrics, and best practice recommendations. A written personalized care plan for preventive services as well as general preventive health recommendations were provided to patient.     Mignon Pine  Francis Dowse, LPN   02/12/8465   Nurse Notes:  Patient  is cogitatively intact. There were no vitals filed for this visit. There is no height or weight on file to calculate BMI. Patient stated that he has no issues with gait or balance; does not use any assistive devices.

## 2020-09-14 NOTE — Patient Instructions (Signed)
Edward Chen , Thank you for taking time to come for your Medicare Wellness Visit. I appreciate your ongoing commitment to your health goals. Please review the following plan we discussed and let me know if I can assist you in the future.   Screening recommendations/referrals: Colonoscopy: 10/04/2017; due every 10 years Recommended yearly ophthalmology/optometry visit for glaucoma screening and checkup Recommended yearly dental visit for hygiene and checkup  Vaccinations: Influenza vaccine: declined Pneumococcal vaccine: declined Tdap vaccine: declined Shingles vaccine: never done   Covid-19: up to date  Advanced directives: Advance directive discussed with you today. Even though you declined this today please call our office should you change your mind and we can give you the proper paperwork for you to fill out.  Conditions/risks identified: Yes; Reviewed health maintenance screenings with patient today and relevant education, vaccines, and/or referrals were provided. Please continue to do your personal lifestyle choices by: daily care of teeth and gums, regular physical activity (goal should be 5 days a week for 30 minutes), eat a healthy diet, avoid tobacco and drug use, limiting any alcohol intake, taking a low-dose aspirin (if not allergic or have been advised by your provider otherwise) and taking vitamins and minerals as recommended by your provider. Continue doing brain stimulating activities (puzzles, reading, adult coloring books, staying active) to keep memory sharp. Continue to eat heart healthy diet (full of fruits, vegetables, whole grains, lean protein, water--limit salt, fat, and sugar intake) and increase physical activity as tolerated.  Next appointment: Please schedule your next Medicare Wellness Visit with your Nurse Health Advisor in 1 year by calling [phone].   Preventive Care 40-64 Years, Male Preventive care refers to lifestyle choices and visits with your health  care provider that can promote health and wellness. What does preventive care include?  A yearly physical exam. This is also called an annual well check.  Dental exams once or twice a year.  Routine eye exams. Ask your health care provider how often you should have your eyes checked.  Personal lifestyle choices, including:  Daily care of your teeth and gums.  Regular physical activity.  Eating a healthy diet.  Avoiding tobacco and drug use.  Limiting alcohol use.  Practicing safe sex.  Taking low-dose aspirin every day starting at age 66. What happens during an annual well check? The services and screenings done by your health care provider during your annual well check will depend on your age, overall health, lifestyle risk factors, and family history of disease. Counseling  Your health care provider may ask you questions about your:  Alcohol use.  Tobacco use.  Drug use.  Emotional well-being.  Home and relationship well-being.  Sexual activity.  Eating habits.  Work and work Statistician. Screening  You may have the following tests or measurements:  Height, weight, and BMI.  Blood pressure.  Lipid and cholesterol levels. These may be checked every 5 years, or more frequently if you are over 68 years old.  Skin check.  Lung cancer screening. You may have this screening every year starting at age 6 if you have a 30-pack-year history of smoking and currently smoke or have quit within the past 15 years.  Fecal occult blood test (FOBT) of the stool. You may have this test every year starting at age 77.  Flexible sigmoidoscopy or colonoscopy. You may have a sigmoidoscopy every 5 years or a colonoscopy every 10 years starting at age 53.  Prostate cancer screening. Recommendations will vary depending on your family  history and other risks.  Hepatitis C blood test.  Hepatitis B blood test.  Sexually transmitted disease (STD) testing.  Diabetes screening.  This is done by checking your blood sugar (glucose) after you have not eaten for a while (fasting). You may have this done every 1-3 years. Discuss your test results, treatment options, and if necessary, the need for more tests with your health care provider. Vaccines  Your health care provider may recommend certain vaccines, such as:  Influenza vaccine. This is recommended every year.  Tetanus, diphtheria, and acellular pertussis (Tdap, Td) vaccine. You may need a Td booster every 10 years.  Zoster vaccine. You may need this after age 7.  Pneumococcal 13-valent conjugate (PCV13) vaccine. You may need this if you have certain conditions and have not been vaccinated.  Pneumococcal polysaccharide (PPSV23) vaccine. You may need one or two doses if you smoke cigarettes or if you have certain conditions. Talk to your health care provider about which screenings and vaccines you need and how often you need them. This information is not intended to replace advice given to you by your health care provider. Make sure you discuss any questions you have with your health care provider. Document Released: 09/25/2015 Document Revised: 05/18/2016 Document Reviewed: 06/30/2015 Elsevier Interactive Patient Education  2017 [employer].  Fall Prevention in the Home Falls can cause injuries. They can happen to people of all ages. There are many things you can do to make your home safe and to help prevent falls. What can I do on the outside of my home?  Regularly fix the edges of walkways and driveways and fix any cracks.  Remove anything that might make you trip as you walk through a door, such as a raised step or threshold.  Trim any bushes or trees on the path to your home.  Use bright outdoor lighting.  Clear any walking paths of anything that might make someone trip, such as rocks or tools.  Regularly check to see if handrails are loose or broken. Make sure that both sides of any steps have  handrails.  Any raised decks and porches should have guardrails on the edges.  Have any leaves, snow, or ice cleared regularly.  Use sand or salt on walking paths during winter.  Clean up any spills in your garage right away. This includes oil or grease spills. What can I do in the bathroom?  Use night lights.  Install grab bars by the toilet and in the tub and shower. Do not use towel bars as grab bars.  Use non-skid mats or decals in the tub or shower.  If you need to sit down in the shower, use a plastic, non-slip stool.  Keep the floor dry. Clean up any water that spills on the floor as soon as it happens.  Remove soap buildup in the tub or shower regularly.  Attach bath mats securely with double-sided non-slip rug tape.  Do not have throw rugs and other things on the floor that can make you trip. What can I do in the bedroom?  Use night lights.  Make sure that you have a light by your bed that is easy to reach.  Do not use any sheets or blankets that are too big for your bed. They should not hang down onto the floor.  Have a firm chair that has side arms. You can use this for support while you get dressed.  Do not have throw rugs and other things on the  floor that can make you trip. What can I do in the kitchen?  Clean up any spills right away.  Avoid walking on wet floors.  Keep items that you use a lot in easy-to-reach places.  If you need to reach something above you, use a strong step stool that has a grab bar.  Keep electrical cords out of the way.  Do not use floor polish or wax that makes floors slippery. If you must use wax, use non-skid floor wax.  Do not have throw rugs and other things on the floor that can make you trip. What can I do with my stairs?  Do not leave any items on the stairs.  Make sure that there are handrails on both sides of the stairs and use them. Fix handrails that are broken or loose. Make sure that handrails are as long as  the stairways.  Check any carpeting to make sure that it is firmly attached to the stairs. Fix any carpet that is loose or worn.  Avoid having throw rugs at the top or bottom of the stairs. If you do have throw rugs, attach them to the floor with carpet tape.  Make sure that you have a light switch at the top of the stairs and the bottom of the stairs. If you do not have them, ask someone to add them for you. What else can I do to help prevent falls?  Wear shoes that:  Do not have high heels.  Have rubber bottoms.  Are comfortable and fit you well.  Are closed at the toe. Do not wear sandals.  If you use a stepladder:  Make sure that it is fully opened. Do not climb a closed stepladder.  Make sure that both sides of the stepladder are locked into place.  Ask someone to hold it for you, if possible.  Clearly mark and make sure that you can see:  Any grab bars or handrails.  First and last steps.  Where the edge of each step is.  Use tools that help you move around (mobility aids) if they are needed. These include:  Canes.  Walkers.  Scooters.  Crutches.  Turn on the lights when you go into a dark area. Replace any light bulbs as soon as they burn out.  Set up your furniture so you have a clear path. Avoid moving your furniture around.  If any of your floors are uneven, fix them.  If there are any pets around you, be aware of where they are.  Review your medicines with your doctor. Some medicines can make you feel dizzy. This can increase your chance of falling. Ask your doctor what other things that you can do to help prevent falls. This information is not intended to replace advice given to you by your health care provider. Make sure you discuss any questions you have with your health care provider. Document Released: 06/25/2009 Document Revised: 02/04/2016 Document Reviewed: 10/03/2014 Elsevier Interactive Patient Education  2017 [employer].

## 2020-09-16 ENCOUNTER — Encounter: Payer: Self-pay | Admitting: Internal Medicine

## 2020-09-16 ENCOUNTER — Other Ambulatory Visit: Payer: Self-pay

## 2020-09-16 ENCOUNTER — Ambulatory Visit (INDEPENDENT_AMBULATORY_CARE_PROVIDER_SITE_OTHER): Payer: Medicare Other | Admitting: Internal Medicine

## 2020-09-16 VITALS — BP 138/88 | HR 68 | Temp 98.2°F | Ht 67.0 in | Wt 142.1 lb

## 2020-09-16 DIAGNOSIS — Z0000 Encounter for general adult medical examination without abnormal findings: Secondary | ICD-10-CM | POA: Diagnosis not present

## 2020-09-16 DIAGNOSIS — E785 Hyperlipidemia, unspecified: Secondary | ICD-10-CM

## 2020-09-16 DIAGNOSIS — G894 Chronic pain syndrome: Secondary | ICD-10-CM

## 2020-09-16 DIAGNOSIS — R739 Hyperglycemia, unspecified: Secondary | ICD-10-CM

## 2020-09-16 DIAGNOSIS — E538 Deficiency of other specified B group vitamins: Secondary | ICD-10-CM | POA: Diagnosis not present

## 2020-09-16 DIAGNOSIS — I10 Essential (primary) hypertension: Secondary | ICD-10-CM

## 2020-09-16 LAB — BASIC METABOLIC PANEL
BUN: 18 mg/dL (ref 6–23)
CO2: 33 mEq/L — ABNORMAL HIGH (ref 19–32)
Calcium: 9.6 mg/dL (ref 8.4–10.5)
Chloride: 102 mEq/L (ref 96–112)
Creatinine, Ser: 1.01 mg/dL (ref 0.40–1.50)
GFR: 80.18 mL/min (ref >60.00)
Glucose, Bld: 103 mg/dL — ABNORMAL HIGH (ref 70–99)
Potassium: 4.7 mEq/L (ref 3.5–5.1)
Sodium: 139 mEq/L (ref 135–145)

## 2020-09-16 LAB — PSA: PSA: 0.63 ng/mL (ref 0.10–4.00)

## 2020-09-16 LAB — CBC WITH DIFFERENTIAL/PLATELET
Basophils Absolute: 0 10*3/uL (ref 0.0–0.1)
Basophils Relative: 0.5 % (ref 0.0–3.0)
Eosinophils Absolute: 0.1 10*3/uL (ref 0.0–0.7)
Eosinophils Relative: 1.4 % (ref 0.0–5.0)
HCT: 42.7 % (ref 39.0–52.0)
Hemoglobin: 15 g/dL (ref 13.0–17.0)
Lymphocytes Relative: 31.3 % (ref 12.0–46.0)
Lymphs Abs: 1.8 10*3/uL (ref 0.7–4.0)
MCHC: 35.1 g/dL (ref 30.0–36.0)
MCV: 92.2 fl (ref 78.0–100.0)
Monocytes Absolute: 0.4 10*3/uL (ref 0.1–1.0)
Monocytes Relative: 6.7 % (ref 3.0–12.0)
Neutro Abs: 3.5 10*3/uL (ref 1.4–7.7)
Neutrophils Relative %: 60.1 % (ref 43.0–77.0)
Platelets: 159 10*3/uL (ref 150.0–400.0)
RBC: 4.63 Mil/uL (ref 4.22–5.81)
RDW: 12.4 % (ref 11.5–15.5)
WBC: 5.8 10*3/uL (ref 4.0–10.5)

## 2020-09-16 LAB — URINALYSIS, ROUTINE W REFLEX MICROSCOPIC
Bilirubin Urine: NEGATIVE
Hgb urine dipstick: NEGATIVE
Ketones, ur: NEGATIVE
Leukocytes,Ua: NEGATIVE
Nitrite: NEGATIVE
RBC / HPF: NONE SEEN (ref 0–?)
Specific Gravity, Urine: 1.02 (ref 1.000–1.030)
Total Protein, Urine: NEGATIVE
Urine Glucose: NEGATIVE
Urobilinogen, UA: 0.2 (ref 0.0–1.0)
WBC, UA: NONE SEEN (ref 0–?)
pH: 7 (ref 5.0–8.0)

## 2020-09-16 LAB — LIPID PANEL
Cholesterol: 199 mg/dL (ref 0–200)
HDL: 60.7 mg/dL (ref >39.00)
LDL Cholesterol: 121 mg/dL — ABNORMAL HIGH (ref 0–99)
NonHDL: 138.13
Total CHOL/HDL Ratio: 3
Triglycerides: 85 mg/dL (ref 0.0–149.0)
VLDL: 17 mg/dL (ref 0.0–40.0)

## 2020-09-16 LAB — HEMOGLOBIN A1C: Hgb A1c MFr Bld: 5 % (ref 4.6–6.5)

## 2020-09-16 LAB — HEPATIC FUNCTION PANEL
ALT: 9 U/L (ref 0–53)
AST: 12 U/L (ref 0–37)
Albumin: 4.9 g/dL (ref 3.5–5.2)
Alkaline Phosphatase: 56 U/L (ref 39–117)
Bilirubin, Direct: 0.1 mg/dL (ref 0.0–0.3)
Total Bilirubin: 0.9 mg/dL (ref 0.2–1.2)
Total Protein: 7.3 g/dL (ref 6.0–8.3)

## 2020-09-16 LAB — TSH: TSH: 1.22 u[IU]/mL (ref 0.35–4.50)

## 2020-09-16 LAB — VITAMIN B12: Vitamin B-12: 900 pg/mL (ref 211–911)

## 2020-09-16 MED ORDER — AZITHROMYCIN 250 MG PO TABS: ORAL_TABLET | ORAL | 1 refills | Status: DC

## 2020-09-16 NOTE — Progress Notes (Signed)
Established Patient Office Visit  Subjective:  Patient ID: Edward Adair., male    DOB: 05/31/1961  Age: 60 y.o. MRN: [id number]       Chief Complaint:  wellness and chronic pain, hld, htn, hyperglycemia b12 deficiency,       HPI:  Edward Eisley. is a 60 y.o. male here for wellness overall doing ok;  Considering booster soon. Conts on LT disabilly for low back, neck and anxiety.  Overall has no specific complaints.  Good med compliance.  Pt denies Chest pain, worsening SOB, DOE, wheezing, orthopnea, PND, worsening LE edema, palpitations, dizziness or syncope.  Pt denies neurological change such as new headache, facial or extremity weakness.  Pt denies polydipsia, polyuria. Pt states has been trying to follow appropriate diet.  Pt denies worsening depressive symptoms, suicidal ideation or panic. Denies fever, night sweats, wt loss, loss of appetite, or other constitutional symptoms.  Trying to be more active with excercise Wt Readings from Last 3 Encounters:  09/16/20 142 lb 2 oz (64.5 kg)  03/10/20 144 lb (65.3 kg)  09/10/19 137 lb (62.1 kg)   BP Readings from Last 3 Encounters:  09/16/20 138/88  03/10/20 140/80  09/10/19 (!) 142/80     Past Medical History:  Diagnosis Date  . Abnormal drug screen 09/2014   neg xanax (but PRN use), pos EtOH  . Acute asthmatic bronchitis   . Allergic rhinitis   . Allergy   . Anxiety    with h/o attacks "on xanax for chest pain"  . Asthma 11/16/2015  . Benign hypertension   . Borderline diabetes mellitus   . Cervical disc disease 11/16/2015  . Chronic pain syndrome    [facility] - percocets 7.5mg   . Depression   . Depression with anxiety 10/29/2017  . DJD (degenerative joint disease)   . Erectile dysfunction 10/29/2017  . Gastroparesis   . GERD (gastroesophageal reflux disease)   . Habitual alcohol use   . Hyperlipidemia   . Lumbar disc disease 11/16/2015  . Palpitations    Past Surgical History:  Procedure Laterality Date  .  APPENDECTOMY    . CERVICAL DISCECTOMY  4/06   decompression, foraminotomy, and fusion Dr. Joya Salm   . COLONOSCOPY  11/2012   1 TA, mod diverticulosis, rpt 5 yrs Deatra Ina)  . EAR CYST EXCISION Right 04/30/2015   Procedure: CYST REMOVAL Right knee;  Surgeon: Ninetta Lights, MD;  Location: [place];  Service: Orthopedics;  Laterality: Right;  . ESOPHAGOGASTRODUODENOSCOPY  02/2014   gastric polyps, retained gastric residue s/p dilatation Deatra Ina)  . KNEE ARTHROSCOPY WITH LATERAL MENISECTOMY Right 04/30/2015   Procedure: RIGHT KNEE ARTHROSCOPY;  Surgeon: Ninetta Lights, MD;  Location: [facility];  Service: Orthopedics;  Laterality: Right;  . KNEE ARTHROSCOPY WITH MENISCAL REPAIR Right 04/30/2015   Procedure: KNEE ARTHROSCOPY WITH OPEN MENISCAL REPAIR;  Surgeon: Ninetta Lights, MD;  Location: [facility];  Service: Orthopedics;  Laterality: Right;  . SHOULDER SURGERY Right 11/05   Arthroscopy, debridement, distal resection  . SHOULDER SURGERY Left 05/2011   Dr. Percell Miller  . UPPER GASTROINTESTINAL ENDOSCOPY     X3    reports that he has been smoking cigarettes. He has smoked for the past 20.00 years. He has never used smokeless tobacco. He reports current alcohol use of about 12.0 standard drinks of alcohol per week. He reports that he does not use drugs. family history includes Lung disease in his father.  Allergies  Allergen Reactions  . Other Anaphylaxis    BLACKBERRIES  . Peanuts [Peanut Oil] Anaphylaxis  . Shellfish Allergy Anaphylaxis  . Betadine [Povidone Iodine] Other (See Comments)    Due to shellfish allergy   . Lexapro [Escitalopram Oxalate] Other (See Comments)  . Nexium [Esomeprazole Magnesium] Other (See Comments)    Chest pain, does ok with prilosec   Current Outpatient Medications on File Prior to Visit  Medication Sig Dispense Refill  . albuterol (PROVENTIL HFA;VENTOLIN HFA) 108 (90 Base) MCG/ACT inhaler Inhale 2 puffs into the  lungs every 6 (six) hours as needed for wheezing or shortness of breath. 1 Inhaler 11  . ALPRAZolam (XANAX) 0.5 MG tablet Take 1 tablet (0.5 mg total) by mouth daily as needed. 90 tablet 1  . aspirin EC 81 MG tablet Take 81 mg by mouth daily.    . baclofen (LIORESAL) 10 MG tablet Take 10 mg by mouth as needed for muscle spasms.    Marland Kitchen buPROPion (WELLBUTRIN) 100 MG tablet 1 tablet by mouth three times a day    . losartan (COZAAR) 100 MG tablet Take 100 mg by mouth daily.    . nitroGLYCERIN (NITRODUR - DOSED IN MG/24 HR) 0.2 mg/hr patch Apply 1/4 patch to skin once a day  as directed    . omeprazole (PRILOSEC) 20 MG capsule TAKE 1 CAPSULE BY MOUTH EVERY DAY 90 capsule 3  . oxyCODONE-acetaminophen (PERCOCET) 7.5-325 MG per tablet Take as directed by pain management    . sildenafil (VIAGRA) 100 MG tablet Take 1/2 to 1 tablet by mouth daily as needed for erectile dysfunction. 20 tablet 4  . vitamin B-12 (CYANOCOBALAMIN) 1000 MCG tablet Take 1 tablet (1,000 mcg total) by mouth daily. 90 tablet 2   No current facility-administered medications on file prior to visit.        ROS:  All others reviewed and negative.  Objective        PE:  BP 138/88 (BP Location: Left Arm, Patient Position: Sitting, Cuff Size: Large)   Pulse 68   Temp 98.2 F (36.8 C) (Oral)   Ht 5\' 7"  (1.702 m)   Wt 142 lb 2 oz (64.5 kg)   SpO2 98%   BMI 22.26 kg/m                 Constitutional: Pt appears in NAD               HENT: Head: NCAT.                Right Ear: External ear normal.                 Left Ear: External ear normal.                Eyes: . Pupils are equal, round, and reactive to light. Conjunctivae and EOM are normal               Nose: without d/c or deformity               Neck: Neck supple. Gross normal ROM               Cardiovascular: Normal rate and regular rhythm.                 Pulmonary/Chest: Effort normal and breath sounds without rales or wheezing.                Abd:  Soft, NT, ND, +  BS, no  organomegaly               Neurological: Pt is alert. At baseline orientation, motor grossly intact               Skin: Skin is warm. No rashes, no other new lesions, LE edema - none               Psychiatric: Pt behavior is normal without agitation   Assessment/Plan:  Edward Coury. is a 59 y.o. White or Caucasian [1] male with  has a past medical history of Abnormal drug screen (09/2014), Acute asthmatic bronchitis, Allergic rhinitis, Allergy, Anxiety, Asthma (11/16/2015), Benign hypertension, Borderline diabetes mellitus, Cervical disc disease (11/16/2015), Chronic pain syndrome, Depression, Depression with anxiety (10/29/2017), DJD (degenerative joint disease), Erectile dysfunction (10/29/2017), Gastroparesis, GERD (gastroesophageal reflux disease), Habitual alcohol use, Hyperlipidemia, Lumbar disc disease (11/16/2015), and Palpitations.  Assessment Plan  See notes Labs reviewed for each problem: Lab Results  Component Value Date   WBC 5.8 09/16/2020   HGB 15.0 09/16/2020   HCT 42.7 09/16/2020   PLT 159.0 09/16/2020   GLUCOSE 103 (H) 09/16/2020   CHOL 199 09/16/2020   TRIG 85.0 09/16/2020   HDL 60.70 09/16/2020   LDLDIRECT 132.0 11/16/2016   LDLCALC 121 (H) 09/16/2020   ALT 9 09/16/2020   AST 12 09/16/2020   NA 139 09/16/2020   K 4.7 09/16/2020   CL 102 09/16/2020   CREATININE 1.01 09/16/2020   BUN 18 09/16/2020   CO2 33 (H) 09/16/2020   TSH 1.22 09/16/2020   PSA 0.63 09/16/2020   HGBA1C 5.0 09/16/2020    Micro: none  Cardiac tracings I have personally interpreted today:  none  Pertinent Radiological findings (summarize): none     Problem List Items Addressed This Visit      High   Preventative health care - Primary    Overall doing well, age appropriate education and counseling updated, referrals for preventative services and immunizations addressed, dietary and smoking counseling addressed, most recent labs reviewed.  I have personally reviewed and have noted:  1)  the patient's medical and social history 2) The pt's use of alcohol, tobacco, and illicit drugs 3) The patient's current medications and supplements 4) Functional ability including ADL's, fall risk, home safety risk, hearing and visual impairment 5) Diet and physical activities 6) Evidence for depression or mood disorder 7) The patient's height, weight, and BMI have been recorded in the chart  I have made referrals, and provided counseling and education based on review of the above       Relevant Orders   PSA (Completed)   Chronic pain syndrome    Has f/u every other month, continue to follow      Relevant Medications   buPROPion (WELLBUTRIN) 100 MG tablet     Medium   HYPERTENSION, BENIGN    BP Readings from Last 3 Encounters:  09/16/20 138/88  03/10/20 140/80  09/10/19 (!) 142/80   Stable, pt to continue medical treatment losartan      Relevant Medications   nitroGLYCERIN (NITRODUR - DOSED IN MG/24 HR) 0.2 mg/hr patch   losartan (COZAAR) 100 MG tablet   Other Relevant Orders   Lipid panel (Completed)   Hepatic function panel (Completed)   CBC with Differential/Platelet (Completed)   TSH (Completed)   Urinalysis, Routine w reflex microscopic (Completed)   Basic metabolic panel (Completed)   Hyperglycemia    Lab Results  Component Value Date   HGBA1C  5.0 09/16/2020   Stable, pt to continue current medical treatment  - diet       Relevant Orders   Hemoglobin A1c (Completed)   Dyslipidemia    Lab Results  Component Value Date   LDLCALC 121 (H) 09/16/2020   Stable, pt for lower chol diet, declines statin      B12 deficiency    Lab Results  Component Value Date   VITAMINB12 900 09/16/2020   Stable, cont oral replacement - b12 1000 mcg qd       Relevant Orders   Vitamin B12 (Completed)      Meds ordered this encounter  Medications  . azithromycin (ZITHROMAX Z-PAK) 250 MG tablet    Sig: 2 tab by mouth day 1, then 1 per day    Dispense:  6 tablet     Refill:  1    Follow-up: No follow-ups on file.   Cathlean Cower, MD 09/16/2020 10:22 AM [facility]

## 2020-09-16 NOTE — Assessment & Plan Note (Signed)
Has f/u every other month, continue to follow

## 2020-09-16 NOTE — Patient Instructions (Signed)

## 2020-09-28 ENCOUNTER — Encounter: Payer: Self-pay | Admitting: Internal Medicine

## 2020-09-28 NOTE — Assessment & Plan Note (Signed)

## 2020-09-28 NOTE — Assessment & Plan Note (Signed)
Lab Results  Component Value Date   VITAMINB12 900 09/16/2020   Stable, cont oral replacement - b12 1000 mcg qd

## 2020-09-28 NOTE — Assessment & Plan Note (Signed)
Lab Results  Component Value Date   LDLCALC 121 (H) 09/16/2020   Stable, pt for lower chol diet, declines statin

## 2020-09-28 NOTE — Assessment & Plan Note (Signed)
BP Readings from Last 3 Encounters:  09/16/20 138/88  03/10/20 140/80  09/10/19 (!) 142/80   Stable, pt to continue medical treatment losartan

## 2020-09-28 NOTE — Assessment & Plan Note (Signed)
Lab Results  Component Value Date   HGBA1C 5.0 09/16/2020   Stable, pt to continue current medical treatment  - diet   

## 2020-10-01 ENCOUNTER — Other Ambulatory Visit: Payer: Self-pay | Admitting: Internal Medicine

## 2020-10-01 NOTE — Telephone Encounter (Signed)
° ° °  Patient requseting a call to discuss B12 and lab results  Patient also requesting baclofen (LIORESAL) 10 MG tablet

## 2020-10-01 NOTE — Telephone Encounter (Signed)
Please refill as per office routine med refill policy (all routine meds refilled for 3 mo or monthly per pt preference up to one year from last visit, then month to month grace period for 3 mo, then further med refills will have to be denied)  

## 2020-10-02 DIAGNOSIS — K5900 Constipation, unspecified: Secondary | ICD-10-CM | POA: Diagnosis not present

## 2020-10-02 DIAGNOSIS — R0789 Other chest pain: Secondary | ICD-10-CM | POA: Diagnosis not present

## 2020-10-02 DIAGNOSIS — G894 Chronic pain syndrome: Secondary | ICD-10-CM | POA: Diagnosis not present

## 2020-10-06 NOTE — Telephone Encounter (Signed)
Patient is requesting a call back about his recent lab results. He can be reached at [phone].

## 2020-10-07 ENCOUNTER — Telehealth (INDEPENDENT_AMBULATORY_CARE_PROVIDER_SITE_OTHER): Payer: Medicare Other | Admitting: Internal Medicine

## 2020-10-07 ENCOUNTER — Telehealth: Payer: Self-pay

## 2020-10-07 DIAGNOSIS — R739 Hyperglycemia, unspecified: Secondary | ICD-10-CM | POA: Diagnosis not present

## 2020-10-07 DIAGNOSIS — E538 Deficiency of other specified B group vitamins: Secondary | ICD-10-CM | POA: Diagnosis not present

## 2020-10-07 DIAGNOSIS — E785 Hyperlipidemia, unspecified: Secondary | ICD-10-CM | POA: Diagnosis not present

## 2020-10-07 DIAGNOSIS — U071 COVID-19: Secondary | ICD-10-CM

## 2020-10-07 MED ORDER — ATORVASTATIN CALCIUM 10 MG PO TABS: 10.0000 mg | ORAL_TABLET | Freq: Every day | ORAL | 3 refills | Status: DC

## 2020-10-07 NOTE — Telephone Encounter (Signed)
Returned pt calls about recent labs left result note on voicemail.

## 2020-10-07 NOTE — Progress Notes (Addendum)
Patient ID: Edward Horns., male   DOB: 06/10/1961, 60 y.o.   MRN: [id number]  Cumulative time during 7-day interval 24 min,, there was not an associated office visit for this concern within a 7 day period.  Verbal consent for services obtained from patient prior to services given.  Names of all persons present for services: Cathlean Cower, MD, patient  Chief complaint: pt wanted to keep some kind of appt as currently has COVID infection but also due for f/u B12, hyperglycemia, HLD  History, background, results pertinent:  Pt here with covid infection day #11, overall mild URI symptoms to start with, now much improved (though wife has worsening symptoms than his) without fever, chills, sob, n/v, diarrhea has only very mild non prod cough.  Did have labs jan 5 prior to visit but then became covid +. Pt denies chest pain, increased sob or doe, wheezing, orthopnea, PND, increased LE swelling, palpitations, dizziness or syncope.  Pt denies new neurological symptoms such as new headache, or facial or extremity weakness or numbness   Pt denies polydipsia, polyuria,   Past Medical History:  Diagnosis Date  . Abnormal drug screen 09/2014   neg xanax (but PRN use), pos EtOH  . Acute asthmatic bronchitis   . Allergic rhinitis   . Allergy   . Anxiety    with h/o attacks "on xanax for chest pain"  . Asthma 11/16/2015  . Benign hypertension   . Borderline diabetes mellitus   . Cervical disc disease 11/16/2015  . Chronic pain syndrome    [facility] - percocets 7.5mg   . Depression   . Depression with anxiety 10/29/2017  . DJD (degenerative joint disease)   . Erectile dysfunction 10/29/2017  . Gastroparesis   . GERD (gastroesophageal reflux disease)   . Habitual alcohol use   . Hyperlipidemia   . Lumbar disc disease 11/16/2015  . Palpitations    No results found for this or any previous visit (from the past 48 hour(s)). Lab Results  Component Value Date   WBC 5.8 09/16/2020   HGB 15.0  09/16/2020   HCT 42.7 09/16/2020   PLT 159.0 09/16/2020   GLUCOSE 103 (H) 09/16/2020   CHOL 199 09/16/2020   TRIG 85.0 09/16/2020   HDL 60.70 09/16/2020   LDLDIRECT 132.0 11/16/2016   LDLCALC 121 (H) 09/16/2020   ALT 9 09/16/2020   AST 12 09/16/2020   NA 139 09/16/2020   K 4.7 09/16/2020   CL 102 09/16/2020   CREATININE 1.01 09/16/2020   BUN 18 09/16/2020   CO2 33 (H) 09/16/2020   TSH 1.22 09/16/2020   PSA 0.63 09/16/2020   HGBA1C 5.0 09/16/2020   A/P/next steps:  COVID infection - d/w pt natural hx, and to continue to monitor for any worsening s/s such as weakness and dyspnea; needs booster shot at 90 days   HLD - uncontrolled, to start lipitor 10 qd  Hyperglycemia - mild, A1c ok, cont to work in diet and exercise, wt control  B12 deficiency - good compliance with med, ok to hold and plan to recheck next visit  Cathlean Cower MD

## 2020-10-10 ENCOUNTER — Encounter: Payer: Self-pay | Admitting: Internal Medicine

## 2020-10-10 DIAGNOSIS — U071 COVID-19: Secondary | ICD-10-CM | POA: Insufficient documentation

## 2020-10-10 NOTE — Patient Instructions (Signed)
To start lipitor 10 qd

## 2020-10-10 NOTE — Assessment & Plan Note (Signed)
See notes

## 2020-10-14 ENCOUNTER — Other Ambulatory Visit: Payer: Self-pay | Admitting: Internal Medicine

## 2020-11-02 ENCOUNTER — Telehealth: Payer: Self-pay | Admitting: Internal Medicine

## 2020-11-02 NOTE — Telephone Encounter (Signed)
[facility]   caller states: headache, front half of head to forehead and eyeballs hurt; diarrhea stopped this morning; vomited this morning as well. Caller states he would like an Rx.  Advised to contact PCP within 24 hours.   Please advise

## 2020-11-02 NOTE — Telephone Encounter (Signed)
Ok for tylenol or advil as needed for pain  I dont see a need for other prescription med based on this information, thanks

## 2020-11-02 NOTE — Telephone Encounter (Signed)
Pt.notified

## 2020-11-03 NOTE — Telephone Encounter (Signed)
Would you advise Imodium first?

## 2020-11-03 NOTE — Telephone Encounter (Signed)
° ° °  Patient requesting medication for diarrhea that has started this morning. The patient is not taking anything for diarrhea OTC

## 2020-11-03 NOTE — Telephone Encounter (Signed)
Yes please, as there is minimal abd pain, no fever or blood in the stool.  O/w should probably seek medical attention thanks

## 2020-11-05 ENCOUNTER — Other Ambulatory Visit: Payer: Self-pay | Admitting: Internal Medicine

## 2020-11-05 ENCOUNTER — Telehealth: Payer: Self-pay

## 2020-11-05 MED ORDER — TELMISARTAN 80 MG PO TABS: 80.0000 mg | ORAL_TABLET | Freq: Every day | ORAL | 1 refills | Status: DC

## 2020-11-05 NOTE — Telephone Encounter (Signed)
Ok to let pt know  We had to change his losartan 100 to micardis 80 due to losartan on backorder and not currently available  This should work the same, [employer]

## 2020-11-05 NOTE — Telephone Encounter (Signed)
Patient notified to take imodium.

## 2020-11-06 ENCOUNTER — Telehealth: Payer: Self-pay | Admitting: Internal Medicine

## 2020-11-06 NOTE — Telephone Encounter (Signed)
non

## 2020-11-30 DIAGNOSIS — K5900 Constipation, unspecified: Secondary | ICD-10-CM | POA: Diagnosis not present

## 2020-11-30 DIAGNOSIS — G894 Chronic pain syndrome: Secondary | ICD-10-CM | POA: Diagnosis not present

## 2020-11-30 DIAGNOSIS — R0789 Other chest pain: Secondary | ICD-10-CM | POA: Diagnosis not present

## 2020-12-24 ENCOUNTER — Telehealth: Payer: Self-pay | Admitting: Internal Medicine

## 2020-12-24 MED ORDER — SILDENAFIL CITRATE 100 MG PO TABS: ORAL_TABLET | ORAL | 4 refills | Status: DC

## 2020-12-24 NOTE — Telephone Encounter (Signed)
    Pharmacy requesting refill be sent for sildenafil (VIAGRA) 100 MG tablet  Pharmacy [place], [place] - [address]

## 2021-01-01 ENCOUNTER — Telehealth: Payer: Self-pay | Admitting: Internal Medicine

## 2021-01-01 MED ORDER — ALPRAZOLAM 0.5 MG PO TABS: 0.5000 mg | ORAL_TABLET | Freq: Every day | ORAL | 1 refills | Status: DC | PRN

## 2021-01-01 NOTE — Telephone Encounter (Signed)
Last Visit: 09/16/20 Next Visit: 03/12/21 Last Written: 10/31/2018  Please Advise; no record of this medication on PMP.

## 2021-01-01 NOTE — Telephone Encounter (Signed)
Patient requesting a refill for ALPRAZolam Duanne Moron) 0.5 MG tablet     [id number] [place] [place] [facility] [address]

## 2021-01-28 DIAGNOSIS — R0789 Other chest pain: Secondary | ICD-10-CM | POA: Diagnosis not present

## 2021-01-28 DIAGNOSIS — G894 Chronic pain syndrome: Secondary | ICD-10-CM | POA: Diagnosis not present

## 2021-01-28 DIAGNOSIS — K5900 Constipation, unspecified: Secondary | ICD-10-CM | POA: Diagnosis not present

## 2021-03-12 ENCOUNTER — Encounter: Payer: Self-pay | Admitting: Internal Medicine

## 2021-03-12 ENCOUNTER — Ambulatory Visit (INDEPENDENT_AMBULATORY_CARE_PROVIDER_SITE_OTHER): Payer: Medicare Other | Admitting: Internal Medicine

## 2021-03-12 ENCOUNTER — Other Ambulatory Visit: Payer: Self-pay

## 2021-03-12 VITALS — BP 120/70 | HR 72 | Temp 97.8°F | Ht 67.0 in | Wt 145.0 lb

## 2021-03-12 DIAGNOSIS — E785 Hyperlipidemia, unspecified: Secondary | ICD-10-CM

## 2021-03-12 DIAGNOSIS — M545 Low back pain, unspecified: Secondary | ICD-10-CM

## 2021-03-12 DIAGNOSIS — N138 Other obstructive and reflux uropathy: Secondary | ICD-10-CM

## 2021-03-12 DIAGNOSIS — E538 Deficiency of other specified B group vitamins: Secondary | ICD-10-CM | POA: Diagnosis not present

## 2021-03-12 DIAGNOSIS — G8929 Other chronic pain: Secondary | ICD-10-CM | POA: Diagnosis not present

## 2021-03-12 DIAGNOSIS — N481 Balanitis: Secondary | ICD-10-CM

## 2021-03-12 DIAGNOSIS — R739 Hyperglycemia, unspecified: Secondary | ICD-10-CM

## 2021-03-12 DIAGNOSIS — E559 Vitamin D deficiency, unspecified: Secondary | ICD-10-CM | POA: Diagnosis not present

## 2021-03-12 DIAGNOSIS — N401 Enlarged prostate with lower urinary tract symptoms: Secondary | ICD-10-CM | POA: Diagnosis not present

## 2021-03-12 DIAGNOSIS — I10 Essential (primary) hypertension: Secondary | ICD-10-CM

## 2021-03-12 LAB — LIPID PANEL
Cholesterol: 144 mg/dL (ref 0–200)
HDL: 49.5 mg/dL (ref >39.00)
LDL Cholesterol: 72 mg/dL (ref 0–99)
NonHDL: 94.02
Total CHOL/HDL Ratio: 3
Triglycerides: 108 mg/dL (ref 0.0–149.0)
VLDL: 21.6 mg/dL (ref 0.0–40.0)

## 2021-03-12 LAB — HEPATIC FUNCTION PANEL
ALT: 15 U/L (ref 0–53)
AST: 17 U/L (ref 0–37)
Albumin: 4.5 g/dL (ref 3.5–5.2)
Alkaline Phosphatase: 58 U/L (ref 39–117)
Bilirubin, Direct: 0.2 mg/dL (ref 0.0–0.3)
Total Bilirubin: 0.8 mg/dL (ref 0.2–1.2)
Total Protein: 7.3 g/dL (ref 6.0–8.3)

## 2021-03-12 LAB — BASIC METABOLIC PANEL
BUN: 17 mg/dL (ref 6–23)
CO2: 29 mEq/L (ref 19–32)
Calcium: 9.5 mg/dL (ref 8.4–10.5)
Chloride: 105 mEq/L (ref 96–112)
Creatinine, Ser: 1.11 mg/dL (ref 0.40–1.50)
GFR: 71.35 mL/min (ref >60.00)
Glucose, Bld: 113 mg/dL — ABNORMAL HIGH (ref 70–99)
Potassium: 4.7 mEq/L (ref 3.5–5.1)
Sodium: 141 mEq/L (ref 135–145)

## 2021-03-12 LAB — VITAMIN D 25 HYDROXY (VIT D DEFICIENCY, FRACTURES): VITD: 42.29 ng/mL (ref 30.00–100.00)

## 2021-03-12 LAB — VITAMIN B12: Vitamin B-12: 420 pg/mL (ref 211–911)

## 2021-03-12 MED ORDER — CLOTRIMAZOLE-BETAMETHASONE 1-0.05 % EX CREA: 1.0000 "application " | TOPICAL_CREAM | Freq: Every day | CUTANEOUS | 0 refills | Status: DC

## 2021-03-12 NOTE — Progress Notes (Signed)
Patient ID: Edward Horns., male   DOB: 1961/08/16, 60 y.o.   MRN: [id number]        Chief Complaint: follow up HTN, HLD and hyperglycemia, bph, b12 deficiency       HPI:  Edward Polak. is a 60 y.o. male here overall doing ok, Pt denies chest pain, increased sob or doe, wheezing, orthopnea, PND, increased LE swelling, palpitations, dizziness or syncope.   Pt denies polydipsia, polyuria, or new focal neuro s/s.  Quit smoing x 4-5 months, was only smoking a few cigs per day, but now completely quit.   Pt denies fever, wt loss, night sweats, loss of appetite, or other constitutional symptoms  Denies worsening depressive symptoms, suicidal ideation, or panic; has ongoing anxiety, stable recently it seems.  Does have some redness of the glans penis and foreskin, asks for cream tx.  Denies urinary symptoms such as dysuria, frequency, urgency, flank pain, hematuria or n/v, fever, chills.  Pt continues to have recurring LBP without change in severity, bowel or bladder change, fever, wt loss,  worsening LE pain/numbness/weakness, gait change or falls Wt Readings from Last 3 Encounters:  03/12/21 145 lb (65.8 kg)  09/16/20 142 lb 2 oz (64.5 kg)  03/10/20 144 lb (65.3 kg)   BP Readings from Last 3 Encounters:  03/12/21 120/70  09/16/20 138/88  03/10/20 140/80         Past Medical History:  Diagnosis Date   Abnormal drug screen 09/2014   neg xanax (but PRN use), pos EtOH   Acute asthmatic bronchitis    Allergic rhinitis    Allergy    Anxiety    with h/o attacks "on xanax for chest pain"   Asthma 11/16/2015   Benign hypertension    Borderline diabetes mellitus    Cervical disc disease 11/16/2015   Chronic pain syndrome    [facility] - percocets 7.5mg    Depression    Depression with anxiety 10/29/2017   DJD (degenerative joint disease)    Erectile dysfunction 10/29/2017   Gastroparesis    GERD (gastroesophageal reflux disease)    Habitual alcohol use    Hyperlipidemia    Lumbar  disc disease 11/16/2015   Palpitations    Past Surgical History:  Procedure Laterality Date   APPENDECTOMY     CERVICAL DISCECTOMY  4/06   decompression, foraminotomy, and fusion Dr. Joya Salm    COLONOSCOPY  11/2012   1 TA, mod diverticulosis, rpt 5 yrs Deatra Ina)   EAR CYST EXCISION Right 04/30/2015   Procedure: CYST REMOVAL Right knee;  Surgeon: Ninetta Lights, MD;  Location: [facility];  Service: Orthopedics;  Laterality: Right;   ESOPHAGOGASTRODUODENOSCOPY  02/2014   gastric polyps, retained gastric residue s/p dilatation Deatra Ina)   KNEE ARTHROSCOPY WITH LATERAL MENISECTOMY Right 04/30/2015   Procedure: RIGHT KNEE ARTHROSCOPY;  Surgeon: Ninetta Lights, MD;  Location: [facility];  Service: Orthopedics;  Laterality: Right;   KNEE ARTHROSCOPY WITH MENISCAL REPAIR Right 04/30/2015   Procedure: KNEE ARTHROSCOPY WITH OPEN MENISCAL REPAIR;  Surgeon: Ninetta Lights, MD;  Location: [facility];  Service: Orthopedics;  Laterality: Right;   SHOULDER SURGERY Right 11/05   Arthroscopy, debridement, distal resection   SHOULDER SURGERY Left 05/2011   Dr. Percell Miller   UPPER GASTROINTESTINAL ENDOSCOPY     X3    reports that he quit smoking about 4 months ago. His smoking use included cigarettes. He has never used smokeless tobacco. He reports current  alcohol use of about 12.0 standard drinks of alcohol per week. He reports that he does not use drugs. family history includes Lung disease in his father. Allergies  Allergen Reactions   Other Anaphylaxis    BLACKBERRIES   Peanuts [Peanut Oil] Anaphylaxis   Shellfish Allergy Anaphylaxis   Betadine [Povidone Iodine] Other (See Comments)    Due to shellfish allergy    Lexapro [Escitalopram Oxalate] Other (See Comments)   Nexium [Esomeprazole Magnesium] Other (See Comments)    Chest pain, does ok with prilosec   Current Outpatient Medications on File Prior to Visit  Medication Sig Dispense Refill   albuterol  (PROVENTIL HFA;VENTOLIN HFA) 108 (90 Base) MCG/ACT inhaler Inhale 2 puffs into the lungs every 6 (six) hours as needed for wheezing or shortness of breath. 1 Inhaler 11   ALPRAZolam (XANAX) 0.5 MG tablet Take 1 tablet (0.5 mg total) by mouth daily as needed. 90 tablet 1   aspirin EC 81 MG tablet Take 81 mg by mouth daily.     atorvastatin (LIPITOR) 10 MG tablet Take 1 tablet (10 mg total) by mouth daily. 90 tablet 3   baclofen (LIORESAL) 10 MG tablet Take 10 mg by mouth as needed for muscle spasms.     buPROPion (WELLBUTRIN) 100 MG tablet 1 tablet by mouth three times a day     omeprazole (PRILOSEC) 20 MG capsule TAKE 1 CAPSULE BY MOUTH EVERY DAY 90 capsule 3   oxyCODONE-acetaminophen (PERCOCET) 7.5-325 MG per tablet Take as directed by pain management     sildenafil (VIAGRA) 100 MG tablet Take 1/2 to 1 tablet by mouth daily as needed for erectile dysfunction. 20 tablet 4   telmisartan (MICARDIS) 80 MG tablet Take 1 tablet (80 mg total) by mouth daily. 90 tablet 1   nitroGLYCERIN (NITRODUR - DOSED IN MG/24 HR) 0.2 mg/hr patch Apply 1/4 patch to skin once a day  as directed (Patient not taking: Reported on 03/12/2021)     No current facility-administered medications on file prior to visit.        ROS:  All others reviewed and negative.  Objective        PE:  BP 120/70 (BP Location: Right Arm, Patient Position: Sitting, Cuff Size: Normal)   Pulse 72   Temp 97.8 F (36.6 C) (Oral)   Ht 5\' 7"  (1.702 m)   Wt 145 lb (65.8 kg)   SpO2 98%   BMI 22.71 kg/m                 Constitutional: Pt appears in NAD               HENT: Head: NCAT.                Right Ear: External ear normal.                 Left Ear: External ear normal.                Eyes: . Pupils are equal, round, and reactive to light. Conjunctivae and EOM are normal               Nose: without d/c or deformity               Neck: Neck supple. Gross normal ROM               Cardiovascular: Normal rate and regular rhythm.  Pulmonary/Chest: Effort normal and breath sounds without rales or wheezing.                Abd:  Soft, NT, ND, + BS, no organomegaly               Neurological: Pt is alert. At baseline orientation, motor grossly intact               Skin: Skin is warm. No rashes, no other new lesions, LE edema - none               Psychiatric: Pt behavior is normal without agitation   Micro: none  Cardiac tracings I have personally interpreted today:  none  Pertinent Radiological findings (summarize): none   Lab Results  Component Value Date   WBC 5.8 09/16/2020   HGB 15.0 09/16/2020   HCT 42.7 09/16/2020   PLT 159.0 09/16/2020   GLUCOSE 113 (H) 03/12/2021   CHOL 144 03/12/2021   TRIG 108.0 03/12/2021   HDL 49.50 03/12/2021   LDLDIRECT 132.0 11/16/2016   LDLCALC 72 03/12/2021   ALT 15 03/12/2021   AST 17 03/12/2021   NA 141 03/12/2021   K 4.7 03/12/2021   CL 105 03/12/2021   CREATININE 1.11 03/12/2021   BUN 17 03/12/2021   CO2 29 03/12/2021   TSH 1.22 09/16/2020   PSA 0.63 09/16/2020   HGBA1C 5.0 09/16/2020   Assessment/Plan:  Edward Horns. is a 60 y.o. White or Caucasian [1] male with  has a past medical history of Abnormal drug screen (09/2014), Acute asthmatic bronchitis, Allergic rhinitis, Allergy, Anxiety, Asthma (11/16/2015), Benign hypertension, Borderline diabetes mellitus, Cervical disc disease (11/16/2015), Chronic pain syndrome, Depression, Depression with anxiety (10/29/2017), DJD (degenerative joint disease), Erectile dysfunction (10/29/2017), Gastroparesis, GERD (gastroesophageal reflux disease), Habitual alcohol use, Hyperlipidemia, Lumbar disc disease (11/16/2015), and Palpitations.  B12 deficiency Lab Results  Component Value Date   VITAMINB12 420 03/12/2021   Stable, cont oral replacement - b12 1000 mcg qd   BPH with obstruction/lower urinary tract symptoms Stable, declines flomaax or other med for now    Chronic low back pain Stable overall, no new neuro  changed,  to f/u any worsening symptoms or concerns  Dyslipidemia Lab Results  Component Value Date   LDLCALC 72 03/12/2021   Stable, pt to continue current statin liptior 10   Hyperglycemia Lab Results  Component Value Date   HGBA1C 5.0 09/16/2020   Stable, pt to continue current medical treatment  - diet    Balanitis Mild, for topical lotrisone asd, to f/u any worsening symptoms or concerns  Followup: No follow-ups on file.  Cathlean Cower, MD 03/13/2021 6:17 PM [facility]

## 2021-03-12 NOTE — Patient Instructions (Signed)
Please take all new medication as prescribed - the cream  Please continue all other medications as before, and refills have been done if requested.  Please have the pharmacy call with any other refills you may need.  Please continue your efforts at being more active, low cholesterol diet, and weight control.  Please keep your appointments with your specialists as you may have planned  Please go to the LAB at the blood drawing area for the tests to be done  You will be contacted by phone if any changes need to be made immediately.  Otherwise, you will receive a letter about your results with an explanation, but please check with MyChart first.  Please remember to sign up for MyChart if you have not done so, as this will be important to you in the future with finding out test results, communicating by private email, and scheduling acute appointments online when needed.  Please make an Appointment to return in 6 months, or sooner if needed .

## 2021-03-13 DIAGNOSIS — N481 Balanitis: Secondary | ICD-10-CM | POA: Insufficient documentation

## 2021-03-13 NOTE — Assessment & Plan Note (Signed)
Lab Results  Component Value Date   VITAMINB12 420 03/12/2021   Stable, cont oral replacement - b12 1000 mcg qd

## 2021-03-13 NOTE — Assessment & Plan Note (Signed)
Stable overall, no new neuro changed,  to f/u any worsening symptoms or concerns

## 2021-03-13 NOTE — Assessment & Plan Note (Signed)
Lab Results  Component Value Date   HGBA1C 5.0 09/16/2020   Stable, pt to continue current medical treatment  - diet

## 2021-03-13 NOTE — Assessment & Plan Note (Signed)
Mild, for topical lotrisone asd, to f/u any worsening symptoms or concerns

## 2021-03-13 NOTE — Assessment & Plan Note (Signed)
Lab Results  Component Value Date   LDLCALC 72 03/12/2021   Stable, pt to continue current statin liptior 10

## 2021-03-13 NOTE — Assessment & Plan Note (Signed)
Stable, declines flomaax or other med for now

## 2021-03-29 DIAGNOSIS — R0789 Other chest pain: Secondary | ICD-10-CM | POA: Diagnosis not present

## 2021-03-29 DIAGNOSIS — G894 Chronic pain syndrome: Secondary | ICD-10-CM | POA: Diagnosis not present

## 2021-03-29 DIAGNOSIS — K5900 Constipation, unspecified: Secondary | ICD-10-CM | POA: Diagnosis not present

## 2021-04-03 ENCOUNTER — Other Ambulatory Visit: Payer: Self-pay | Admitting: Internal Medicine

## 2021-04-27 ENCOUNTER — Other Ambulatory Visit: Payer: Self-pay

## 2021-04-27 ENCOUNTER — Telehealth: Payer: Self-pay

## 2021-04-27 ENCOUNTER — Telehealth: Payer: Self-pay | Admitting: Internal Medicine

## 2021-04-27 ENCOUNTER — Other Ambulatory Visit: Payer: Self-pay | Admitting: Internal Medicine

## 2021-04-27 MED ORDER — ATORVASTATIN CALCIUM 10 MG PO TABS: 10.0000 mg | ORAL_TABLET | Freq: Every day | ORAL | 2 refills | Status: DC

## 2021-04-27 NOTE — Telephone Encounter (Signed)
1.Medication Requested: atorvastatin (LIPITOR) 10 MG tablet  2. Pharmacy (Name, [place], [place]): [id number] [place] [address] Phone:  [phone]:  [phone]  3. On Med List: yes  4. Last Visit with PCP: 01.24.23  5. Next visit date with PCP: 07.01.22   Agent: Please be advised that RX refills may take up to 3 business days. We ask that you follow-up with your pharmacy.

## 2021-04-27 NOTE — Telephone Encounter (Signed)
No need to take this if he is taking the micardis (telmisartan) as they are similar medications   thanks

## 2021-04-27 NOTE — Telephone Encounter (Signed)
pt has stated he would like to know if he is suppose to be taking losartan (COZAAR) 100 MG tablet or not as when he went to the pharmacy for refill they stated this medication was d/c by his PCP. Pt states is there a new BP medication that he needs to start?

## 2021-04-28 NOTE — Telephone Encounter (Signed)
Pt states he did not know he was supposed to be taking Micardis as he was not told. States he finished the rest of his Losartan yesterday & just began taking the Micardis.

## 2021-05-24 DIAGNOSIS — R0789 Other chest pain: Secondary | ICD-10-CM | POA: Diagnosis not present

## 2021-05-24 DIAGNOSIS — G894 Chronic pain syndrome: Secondary | ICD-10-CM | POA: Diagnosis not present

## 2021-05-24 DIAGNOSIS — K5900 Constipation, unspecified: Secondary | ICD-10-CM | POA: Diagnosis not present

## 2021-05-24 DIAGNOSIS — Z79891 Long term (current) use of opiate analgesic: Secondary | ICD-10-CM | POA: Diagnosis not present

## 2021-06-17 DIAGNOSIS — M47816 Spondylosis without myelopathy or radiculopathy, lumbar region: Secondary | ICD-10-CM | POA: Diagnosis not present

## 2021-07-28 DIAGNOSIS — R0789 Other chest pain: Secondary | ICD-10-CM | POA: Diagnosis not present

## 2021-07-28 DIAGNOSIS — K5900 Constipation, unspecified: Secondary | ICD-10-CM | POA: Diagnosis not present

## 2021-07-28 DIAGNOSIS — G894 Chronic pain syndrome: Secondary | ICD-10-CM | POA: Diagnosis not present

## 2021-09-15 ENCOUNTER — Ambulatory Visit: Payer: Medicare Other | Admitting: Internal Medicine

## 2021-09-16 ENCOUNTER — Other Ambulatory Visit: Payer: Self-pay

## 2021-09-16 ENCOUNTER — Ambulatory Visit (INDEPENDENT_AMBULATORY_CARE_PROVIDER_SITE_OTHER): Payer: Medicare Other | Admitting: Internal Medicine

## 2021-09-16 ENCOUNTER — Encounter: Payer: Self-pay | Admitting: Internal Medicine

## 2021-09-16 VITALS — BP 110/60 | HR 71 | Temp 98.5°F | Ht 67.0 in | Wt 147.6 lb

## 2021-09-16 DIAGNOSIS — G8929 Other chronic pain: Secondary | ICD-10-CM | POA: Diagnosis not present

## 2021-09-16 DIAGNOSIS — R739 Hyperglycemia, unspecified: Secondary | ICD-10-CM

## 2021-09-16 DIAGNOSIS — M545 Low back pain, unspecified: Secondary | ICD-10-CM

## 2021-09-16 DIAGNOSIS — E538 Deficiency of other specified B group vitamins: Secondary | ICD-10-CM

## 2021-09-16 DIAGNOSIS — E559 Vitamin D deficiency, unspecified: Secondary | ICD-10-CM

## 2021-09-16 DIAGNOSIS — Z0001 Encounter for general adult medical examination with abnormal findings: Secondary | ICD-10-CM | POA: Diagnosis not present

## 2021-09-16 DIAGNOSIS — I10 Essential (primary) hypertension: Secondary | ICD-10-CM | POA: Diagnosis not present

## 2021-09-16 DIAGNOSIS — E785 Hyperlipidemia, unspecified: Secondary | ICD-10-CM | POA: Diagnosis not present

## 2021-09-16 LAB — URINALYSIS, ROUTINE W REFLEX MICROSCOPIC
Bilirubin Urine: NEGATIVE
Hgb urine dipstick: NEGATIVE
Ketones, ur: NEGATIVE
Leukocytes,Ua: NEGATIVE
Nitrite: NEGATIVE
RBC / HPF: NONE SEEN (ref 0–?)
Specific Gravity, Urine: 1.025 (ref 1.000–1.030)
Total Protein, Urine: NEGATIVE
Urine Glucose: NEGATIVE
Urobilinogen, UA: 0.2 (ref 0.0–1.0)
pH: 6 (ref 5.0–8.0)

## 2021-09-16 LAB — BASIC METABOLIC PANEL
BUN: 16 mg/dL (ref 6–23)
CO2: 30 mEq/L (ref 19–32)
Calcium: 9.2 mg/dL (ref 8.4–10.5)
Chloride: 104 mEq/L (ref 96–112)
Creatinine, Ser: 1.1 mg/dL (ref 0.40–1.50)
GFR: 71.87 mL/min (ref >60.00)
Glucose, Bld: 115 mg/dL — ABNORMAL HIGH (ref 70–99)
Potassium: 4.2 mEq/L (ref 3.5–5.1)
Sodium: 139 mEq/L (ref 135–145)

## 2021-09-16 LAB — CBC WITH DIFFERENTIAL/PLATELET
Basophils Absolute: 0 10*3/uL (ref 0.0–0.1)
Basophils Relative: 0.6 % (ref 0.0–3.0)
Eosinophils Absolute: 0.1 10*3/uL (ref 0.0–0.7)
Eosinophils Relative: 1.9 % (ref 0.0–5.0)
HCT: 40 % (ref 39.0–52.0)
Hemoglobin: 13.9 g/dL (ref 13.0–17.0)
Lymphocytes Relative: 29.6 % (ref 12.0–46.0)
Lymphs Abs: 1.8 10*3/uL (ref 0.7–4.0)
MCHC: 34.8 g/dL (ref 30.0–36.0)
MCV: 90.6 fl (ref 78.0–100.0)
Monocytes Absolute: 0.3 10*3/uL (ref 0.1–1.0)
Monocytes Relative: 5.8 % (ref 3.0–12.0)
Neutro Abs: 3.7 10*3/uL (ref 1.4–7.7)
Neutrophils Relative %: 62.1 % (ref 43.0–77.0)
Platelets: 152 10*3/uL (ref 150.0–400.0)
RBC: 4.42 Mil/uL (ref 4.22–5.81)
RDW: 12.6 % (ref 11.5–15.5)
WBC: 6 10*3/uL (ref 4.0–10.5)

## 2021-09-16 LAB — HEPATIC FUNCTION PANEL
ALT: 15 U/L (ref 0–53)
AST: 15 U/L (ref 0–37)
Albumin: 4.4 g/dL (ref 3.5–5.2)
Alkaline Phosphatase: 55 U/L (ref 39–117)
Bilirubin, Direct: 0.2 mg/dL (ref 0.0–0.3)
Total Bilirubin: 1.3 mg/dL — ABNORMAL HIGH (ref 0.2–1.2)
Total Protein: 7.2 g/dL (ref 6.0–8.3)

## 2021-09-16 LAB — LIPID PANEL
Cholesterol: 152 mg/dL (ref 0–200)
HDL: 43.6 mg/dL (ref >39.00)
NonHDL: 108.16
Total CHOL/HDL Ratio: 3
Triglycerides: 228 mg/dL — ABNORMAL HIGH (ref 0.0–149.0)
VLDL: 45.6 mg/dL — ABNORMAL HIGH (ref 0.0–40.0)

## 2021-09-16 LAB — TSH: TSH: 1.44 u[IU]/mL (ref 0.35–5.50)

## 2021-09-16 LAB — VITAMIN D 25 HYDROXY (VIT D DEFICIENCY, FRACTURES): VITD: 34.83 ng/mL (ref 30.00–100.00)

## 2021-09-16 LAB — HEMOGLOBIN A1C: Hgb A1c MFr Bld: 5.1 % (ref 4.6–6.5)

## 2021-09-16 LAB — LDL CHOLESTEROL, DIRECT: Direct LDL: 86 mg/dL

## 2021-09-16 LAB — VITAMIN B12: Vitamin B-12: 294 pg/mL (ref 211–911)

## 2021-09-16 LAB — PSA: PSA: 0.85 ng/mL (ref 0.10–4.00)

## 2021-09-16 MED ORDER — AZITHROMYCIN 250 MG PO TABS: ORAL_TABLET | ORAL | 1 refills | Status: AC

## 2021-09-16 MED ORDER — LIDOCAINE 5 % EX PTCH: 1.0000 | MEDICATED_PATCH | CUTANEOUS | 1 refills | Status: DC

## 2021-09-16 NOTE — Progress Notes (Signed)
Patient ID: Edward Horns., male   DOB: 06/18/1961, 62 y.o.   MRN: [id number]         Chief Complaint:: wellness exam and hyperglycemia, low b12, chronic lbp flare       HPI:  Edward Niblett. is a 61 y.o. male here for wellness exam; declines covid booster, flu, shingirx, pneumovax, tdap o/w up to date                        Also Pt continues to have recurring LBP without bowel or bladder change, fever, wt loss,  worsening LE pain/numbness/weakness, gait change or falls, but now with mild worsening severity in the past wk to low midline.   Pt denies chest pain, increased sob or doe, wheezing, orthopnea, PND, increased LE swelling, palpitations, dizziness or syncope.   Pt denies polydipsia, polyuria, or new focal neuro s/s.  Not taking B12.   Pt denies polydipsia, polyuria, or new focal neuro s/s.   Pt denies fever, wt loss, night sweats, loss of appetite, or other constitutional symptoms     Wt Readings from Last 3 Encounters:  09/16/21 147 lb 9.6 oz (67 kg)  03/12/21 145 lb (65.8 kg)  09/16/20 142 lb 2 oz (64.5 kg)   BP Readings from Last 3 Encounters:  09/16/21 110/60  03/12/21 120/70  09/16/20 138/88   Immunization History  Administered Date(s) Administered   Influenza Whole 06/12/2004   PFIZER(Purple Top)SARS-COV-2 Vaccination 12/30/2019, 01/20/2020   Pneumococcal Polysaccharide-23 10/12/2009   Td 09/13/2011   There are no preventive care reminders to display for this patient.     Past Medical History:  Diagnosis Date   Abnormal drug screen 09/2014   neg xanax (but PRN use), pos EtOH   Acute asthmatic bronchitis    Allergic rhinitis    Allergy    Anxiety    with h/o attacks "on xanax for chest pain"   Asthma 11/16/2015   Benign hypertension    Borderline diabetes mellitus    Cervical disc disease 11/16/2015   Chronic pain syndrome    [facility] - percocets 7.5mg    Depression    Depression with anxiety 10/29/2017   DJD (degenerative joint disease)     Erectile dysfunction 10/29/2017   Gastroparesis    GERD (gastroesophageal reflux disease)    Habitual alcohol use    Hyperlipidemia    Lumbar disc disease 11/16/2015   Palpitations    Past Surgical History:  Procedure Laterality Date   APPENDECTOMY     CERVICAL DISCECTOMY  4/06   decompression, foraminotomy, and fusion Dr. Joya Salm    COLONOSCOPY  11/2012   1 TA, mod diverticulosis, rpt 5 yrs Deatra Ina)   EAR CYST EXCISION Right 04/30/2015   Procedure: CYST REMOVAL Right knee;  Surgeon: Ninetta Lights, MD;  Location: [facility];  Service: Orthopedics;  Laterality: Right;   ESOPHAGOGASTRODUODENOSCOPY  02/2014   gastric polyps, retained gastric residue s/p dilatation Deatra Ina)   KNEE ARTHROSCOPY WITH LATERAL MENISECTOMY Right 04/30/2015   Procedure: RIGHT KNEE ARTHROSCOPY;  Surgeon: Ninetta Lights, MD;  Location: [facility];  Service: Orthopedics;  Laterality: Right;   KNEE ARTHROSCOPY WITH MENISCAL REPAIR Right 04/30/2015   Procedure: KNEE ARTHROSCOPY WITH OPEN MENISCAL REPAIR;  Surgeon: Ninetta Lights, MD;  Location: [facility];  Service: Orthopedics;  Laterality: Right;   SHOULDER SURGERY Right 11/05   Arthroscopy, debridement, distal resection   SHOULDER SURGERY Left  05/2011   Dr. Percell Miller   UPPER GASTROINTESTINAL ENDOSCOPY     X3    reports that he quit smoking about 10 months ago. His smoking use included cigarettes. He has never used smokeless tobacco. He reports current alcohol use of about 12.0 standard drinks per week. He reports that he does not use drugs. family history includes Lung disease in his father. Allergies  Allergen Reactions   Other Anaphylaxis    BLACKBERRIES   Peanuts [Peanut Oil] Anaphylaxis   Shellfish Allergy Anaphylaxis   Betadine [Povidone Iodine] Other (See Comments)    Due to shellfish allergy    Lexapro [Escitalopram Oxalate] Other (See Comments)   Nexium [Esomeprazole Magnesium] Other (See Comments)    Chest  pain, does ok with prilosec   Current Outpatient Medications on File Prior to Visit  Medication Sig Dispense Refill   albuterol (PROVENTIL HFA;VENTOLIN HFA) 108 (90 Base) MCG/ACT inhaler Inhale 2 puffs into the lungs every 6 (six) hours as needed for wheezing or shortness of breath. 1 Inhaler 11   ALPRAZolam (XANAX) 0.5 MG tablet Take 1 tablet (0.5 mg total) by mouth daily as needed. 90 tablet 1   aspirin EC 81 MG tablet Take 81 mg by mouth daily.     atorvastatin (LIPITOR) 10 MG tablet Take 1 tablet (10 mg total) by mouth daily. 90 tablet 2   baclofen (LIORESAL) 10 MG tablet Take 10 mg by mouth as needed for muscle spasms.     buPROPion (WELLBUTRIN) 100 MG tablet 1 tablet by mouth three times a day     clotrimazole-betamethasone (LOTRISONE) cream Apply 1 application topically daily. 30 g 0   omeprazole (PRILOSEC) 20 MG capsule TAKE 1 CAPSULE BY MOUTH EVERY DAY 90 capsule 3   oxyCODONE-acetaminophen (PERCOCET) 7.5-325 MG per tablet Take as directed by pain management     sildenafil (VIAGRA) 100 MG tablet Take 1/2 to 1 tablet by mouth daily as needed for erectile dysfunction. 20 tablet 4   telmisartan (MICARDIS) 80 MG tablet Take 1 tablet (80 mg total) by mouth daily. 90 tablet 1   nitroGLYCERIN (NITRODUR - DOSED IN MG/24 HR) 0.2 mg/hr patch Apply 1/4 patch to skin once a day  as directed (Patient not taking: Reported on 03/12/2021)     No current facility-administered medications on file prior to visit.        ROS:  All others reviewed and negative.  Objective        PE:  BP 110/60 (BP Location: Right Arm, Patient Position: Sitting, Cuff Size: Large)    Pulse 71    Temp 98.5 F (36.9 C) (Oral)    Ht 5\' 7"  (1.702 m)    Wt 147 lb 9.6 oz (67 kg)    SpO2 97%    BMI 23.12 kg/m                 Constitutional: Pt appears in NAD               HENT: Head: NCAT.                Right Ear: External ear normal.                 Left Ear: External ear normal.                Eyes: . Pupils are equal,  round, and reactive to light. Conjunctivae and EOM are normal  Nose: without d/c or deformity               Neck: Neck supple. Gross normal ROM               Cardiovascular: Normal rate and regular rhythm.                 Pulmonary/Chest: Effort normal and breath sounds without rales or wheezing.                Abd:  Soft, NT, ND, + BS, no organomegaly               Neurological: Pt is alert. At baseline orientation, motor grossly intact, cn 2-12 intact, tender to bilat lumbar paravertebral ares               Skin: Skin is warm. No rashes, no other new lesions, LE edema - none               Psychiatric: Pt behavior is normal without agitation   Micro: none  Cardiac tracings I have personally interpreted today:  none  Pertinent Radiological findings (summarize): none   Lab Results  Component Value Date   WBC 6.0 09/16/2021   HGB 13.9 09/16/2021   HCT 40.0 09/16/2021   PLT 152.0 09/16/2021   GLUCOSE 115 (H) 09/16/2021   CHOL 152 09/16/2021   TRIG 228.0 (H) 09/16/2021   HDL 43.60 09/16/2021   LDLDIRECT 86.0 09/16/2021   LDLCALC 72 03/12/2021   ALT 15 09/16/2021   AST 15 09/16/2021   NA 139 09/16/2021   K 4.2 09/16/2021   CL 104 09/16/2021   CREATININE 1.10 09/16/2021   BUN 16 09/16/2021   CO2 30 09/16/2021   TSH 1.44 09/16/2021   PSA 0.85 09/16/2021   HGBA1C 5.1 09/16/2021   Assessment/Plan:  Edward Chen. is a 61 y.o. White or Caucasian [1] male with  has a past medical history of Abnormal drug screen (09/2014), Acute asthmatic bronchitis, Allergic rhinitis, Allergy, Anxiety, Asthma (11/16/2015), Benign hypertension, Borderline diabetes mellitus, Cervical disc disease (11/16/2015), Chronic pain syndrome, Depression, Depression with anxiety (10/29/2017), DJD (degenerative joint disease), Erectile dysfunction (10/29/2017), Gastroparesis, GERD (gastroesophageal reflux disease), Habitual alcohol use, Hyperlipidemia, Lumbar disc disease (11/16/2015), and  Palpitations.  Encounter for well adult exam with abnormal findings Age and sex appropriate education and counseling updated with regular exercise and diet Referrals for preventative services - none needed Immunizations addressed - declines covid booster, flu, shingrix, pneumovax, tdap Smoking counseling  - none needed Evidence for depression or other mood disorder - chronic anxiety no change Most recent labs reviewed. I have personally reviewed and have noted: 1) the patient's medical and social history 2) The patient's current medications and supplements 3) The patient's height, weight, and BMI have been recorded in the chart   B12 deficiency Lab Results  Component Value Date   VITAMINB12 294 09/16/2021   Low normal, to start oral replacement - b12 1000 mcg qd   Chronic low back pain With mild flare - for lidoderm patch prn,  to f/u any worsening symptoms or concerns  Dyslipidemia Lab Results  Component Value Date   LDLCALC 72 03/12/2021   Mild uncontrolled, pt to continue current statin lipitor 10 as declines to change   Hyperglycemia Lab Results  Component Value Date   HGBA1C 5.1 09/16/2021   Stable, pt to continue current medical treatment  - diet   HYPERTENSION, BENIGN BP Readings from Last 3 Encounters:  09/16/21 110/60  03/12/21 120/70  09/16/20 138/88   Stable, pt to continue medical treatment micardis   Vitamin D deficiency Last vitamin D Lab Results  Component Value Date   VD25OH 34.83 09/16/2021   Low , to start oral replacement  Followup: Return in about 6 months (around 03/16/2022).  Cathlean Cower, MD 09/19/2021 6:39 PM [facility]

## 2021-09-16 NOTE — Patient Instructions (Signed)
Please take all new medication as prescribed - the lidoderm patch  Please continue all other medications as before, and refills have been done if requested.  Please have the pharmacy call with any other refills you may need.  Please continue your efforts at being more active, low cholesterol diet, and weight control.  You are otherwise up to date with prevention measures today.  Please keep your appointments with your specialists as you may have planned  Please go to the LAB at the blood drawing area for the tests to be done  You will be contacted by phone if any changes need to be made immediately.  Otherwise, you will receive a letter about your results with an explanation, but please check with MyChart first.  Please remember to sign up for MyChart if you have not done so, as this will be important to you in the future with finding out test results, communicating by private email, and scheduling acute appointments online when needed.  Please make an Appointment to return in 6 months, or sooner if needed

## 2021-09-19 ENCOUNTER — Encounter: Payer: Self-pay | Admitting: Internal Medicine

## 2021-09-19 DIAGNOSIS — E559 Vitamin D deficiency, unspecified: Secondary | ICD-10-CM | POA: Insufficient documentation

## 2021-09-19 NOTE — Assessment & Plan Note (Signed)
Lab Results  Component Value Date   LDLCALC 72 03/12/2021   Mild uncontrolled, pt to continue current statin lipitor 10 as declines to change

## 2021-09-19 NOTE — Assessment & Plan Note (Signed)
Lab Results  Component Value Date   VITAMINB12 294 09/16/2021   Low normal, to start oral replacement - b12 1000 mcg qd

## 2021-09-19 NOTE — Assessment & Plan Note (Signed)
BP Readings from Last 3 Encounters:  09/16/21 110/60  03/12/21 120/70  09/16/20 138/88   Stable, pt to continue medical treatment micardis

## 2021-09-19 NOTE — Assessment & Plan Note (Signed)
Last vitamin D Lab Results  Component Value Date   VD25OH 34.83 09/16/2021   Low, to start oral replacement  

## 2021-09-19 NOTE — Assessment & Plan Note (Signed)
Lab Results  Component Value Date   HGBA1C 5.1 09/16/2021   Stable, pt to continue current medical treatment  - diet

## 2021-09-19 NOTE — Assessment & Plan Note (Signed)
With mild flare - for lidoderm patch prn,  to f/u any worsening symptoms or concerns

## 2021-09-19 NOTE — Assessment & Plan Note (Signed)
Age and sex appropriate education and counseling updated with regular exercise and diet Referrals for preventative services - none needed Immunizations addressed - declines covid booster, flu, shingrix, pneumovax, tdap Smoking counseling  - none needed Evidence for depression or other mood disorder - chronic anxiety no change Most recent labs reviewed. I have personally reviewed and have noted: 1) the patient's medical and social history 2) The patient's current medications and supplements 3) The patient's height, weight, and BMI have been recorded in the chart

## 2021-09-23 ENCOUNTER — Telehealth: Payer: Self-pay

## 2021-09-23 DIAGNOSIS — G894 Chronic pain syndrome: Secondary | ICD-10-CM | POA: Diagnosis not present

## 2021-09-23 DIAGNOSIS — R0789 Other chest pain: Secondary | ICD-10-CM | POA: Diagnosis not present

## 2021-09-23 DIAGNOSIS — K5900 Constipation, unspecified: Secondary | ICD-10-CM | POA: Diagnosis not present

## 2021-09-23 NOTE — Telephone Encounter (Signed)
PA for Lidocaine 5% patches  Key: [id number]  LIDOCAINE PAD 5% is approved through 09/11/2022

## 2021-09-30 ENCOUNTER — Other Ambulatory Visit: Payer: Self-pay

## 2021-09-30 ENCOUNTER — Ambulatory Visit (INDEPENDENT_AMBULATORY_CARE_PROVIDER_SITE_OTHER): Payer: Medicare Other

## 2021-09-30 DIAGNOSIS — Z0000 Encounter for general adult medical examination without abnormal findings: Secondary | ICD-10-CM

## 2021-09-30 NOTE — Progress Notes (Signed)
I connected with Edward Chen. today by telephone and verified that I am speaking with the correct person using two identifiers. Location patient: home Location provider: work Persons participating in the virtual visit: patient, provider.   I discussed the limitations, risks, security and privacy concerns of performing an evaluation and management service by telephone and the availability of in person appointments. I also discussed with the patient that there may be a patient responsible charge related to this service. The patient expressed understanding and verbally consented to this telephonic visit.    Interactive audio and video telecommunications were attempted between this provider and patient, however failed, due to patient having technical difficulties OR patient did not have access to video capability.  We continued and completed visit with audio only.  Some vital signs may be absent or patient reported.   Time Spent with patient on telephone encounter: 40 minutes  Subjective:   Edward Chen. is a 61 y.o. male who presents for Medicare Annual/Subsequent preventive examination.  Review of Systems     Cardiac Risk Factors include: advanced age (>57men, >61 women);dyslipidemia;hypertension;male gender     Objective:    Today's Vitals   09/30/21 0844  PainSc: 10-Worst pain ever   There is no height or weight on file to calculate BMI.  Advanced Directives 09/30/2021 09/14/2020 04/30/2015 04/28/2015 02/14/2014  Does Patient Have a Medical Advance Directive? No No No No Patient does not have advance directive  Would patient like information on creating a medical advance directive? No - Patient declined No - Patient declined No - patient declined information - -    Current Medications (verified) Outpatient Encounter Medications as of 09/30/2021  Medication Sig   albuterol (PROVENTIL HFA;VENTOLIN HFA) 108 (90 Base) MCG/ACT inhaler Inhale 2 puffs into the lungs every 6 (six)  hours as needed for wheezing or shortness of breath.   ALPRAZolam (XANAX) 0.5 MG tablet Take 1 tablet (0.5 mg total) by mouth daily as needed.   aspirin EC 81 MG tablet Take 81 mg by mouth daily.   atorvastatin (LIPITOR) 10 MG tablet Take 1 tablet (10 mg total) by mouth daily.   baclofen (LIORESAL) 10 MG tablet Take 10 mg by mouth as needed for muscle spasms.   buPROPion (WELLBUTRIN) 100 MG tablet 1 tablet by mouth three times a day   clotrimazole-betamethasone (LOTRISONE) cream Apply 1 application topically daily.   lidocaine (LIDODERM) 5 % Place 1 patch onto the skin daily. Remove & Discard patch within 12 hours or as directed by MD   nitroGLYCERIN (NITRODUR - DOSED IN MG/24 HR) 0.2 mg/hr patch Apply 1/4 patch to skin once a day  as directed (Patient not taking: Reported on 03/12/2021)   omeprazole (PRILOSEC) 20 MG capsule TAKE 1 CAPSULE BY MOUTH EVERY DAY   oxyCODONE-acetaminophen (PERCOCET) 7.5-325 MG per tablet Take as directed by pain management   sildenafil (VIAGRA) 100 MG tablet Take 1/2 to 1 tablet by mouth daily as needed for erectile dysfunction.   telmisartan (MICARDIS) 80 MG tablet Take 1 tablet (80 mg total) by mouth daily.   No facility-administered encounter medications on file as of 09/30/2021.    Allergies (verified) Other, Peanuts [peanut oil], Shellfish allergy, Betadine [povidone iodine], Lexapro [escitalopram oxalate], and Nexium [esomeprazole magnesium]   History: Past Medical History:  Diagnosis Date   Abnormal drug screen 09/2014   neg xanax (but PRN use), pos EtOH   Acute asthmatic bronchitis    Allergic rhinitis    Allergy  Anxiety    with h/o attacks "on xanax for chest pain"   Asthma 11/16/2015   Benign hypertension    Borderline diabetes mellitus    Cervical disc disease 11/16/2015   Chronic pain syndrome    [facility] - percocets 7.5mg    Depression    Depression with anxiety 10/29/2017   DJD (degenerative joint disease)    Erectile dysfunction  10/29/2017   Gastroparesis    GERD (gastroesophageal reflux disease)    Habitual alcohol use    Hyperlipidemia    Lumbar disc disease 11/16/2015   Palpitations    Past Surgical History:  Procedure Laterality Date   APPENDECTOMY     CERVICAL DISCECTOMY  4/06   decompression, foraminotomy, and fusion Dr. Joya Salm    COLONOSCOPY  11/2012   1 TA, mod diverticulosis, rpt 5 yrs Deatra Ina)   EAR CYST EXCISION Right 04/30/2015   Procedure: CYST REMOVAL Right knee;  Surgeon: Ninetta Lights, MD;  Location: [facility];  Service: Orthopedics;  Laterality: Right;   ESOPHAGOGASTRODUODENOSCOPY  02/2014   gastric polyps, retained gastric residue s/p dilatation Deatra Ina)   KNEE ARTHROSCOPY WITH LATERAL MENISECTOMY Right 04/30/2015   Procedure: RIGHT KNEE ARTHROSCOPY;  Surgeon: Ninetta Lights, MD;  Location: [facility];  Service: Orthopedics;  Laterality: Right;   KNEE ARTHROSCOPY WITH MENISCAL REPAIR Right 04/30/2015   Procedure: KNEE ARTHROSCOPY WITH OPEN MENISCAL REPAIR;  Surgeon: Ninetta Lights, MD;  Location: [facility];  Service: Orthopedics;  Laterality: Right;   SHOULDER SURGERY Right 11/05   Arthroscopy, debridement, distal resection   SHOULDER SURGERY Left 05/2011   Dr. Percell Miller   UPPER GASTROINTESTINAL ENDOSCOPY     X3   Family History  Problem Relation Age of Onset   Lung disease Father        smoker   Cancer Neg Hx    CAD Neg Hx    Stroke Neg Hx    Diabetes Neg Hx    Colon cancer Neg Hx    Esophageal cancer Neg Hx    Stomach cancer Neg Hx    Rectal cancer Neg Hx    Social History   Socioeconomic History   Marital status: Married    Spouse name: Sajan [place]atwood   Number of children: Not on file   Years of education: Not on file   Highest education level: Not on file  Occupational History   Occupation: retired on disability  Tobacco Use   Smoking status: Former    Years: 20.00    Types: Cigarettes    Quit date: 10/20/2020    Years since  quitting: 0.9   Smokeless tobacco: Never   Tobacco comments:    smokes a couple of times a week  Vaping Use   Vaping Use: Never used  Substance and Sexual Activity   Alcohol use: Yes    Alcohol/week: 12.0 standard drinks    Types: 12 Cans of beer per week    Comment: DRINKS ON WEEKENDS   Drug use: No   Sexual activity: Not on file    Comment: smokes when drinking  Other Topics Concern   Not on file  Social History Narrative   On disability for chronic lower back pain followed by pain clinic   Social Determinants of Health   Financial Resource Strain: Low Risk    Difficulty of Paying Living Expenses: Not hard at all  Food Insecurity: No Food Insecurity   Worried About Charity fundraiser in  the Last Year: Never true   Ran Out of Food in the Last Year: Never true  Transportation Needs: No Transportation Needs   Lack of Transportation (Medical): No   Lack of Transportation (Non-Medical): No  Physical Activity: Inactive   Days of Exercise per Week: 0 days   Minutes of Exercise per Session: 0 min  Stress: No Stress Concern Present   Feeling of Stress : Not at all  Social Connections: Not on file    Tobacco Counseling Counseling given: Not Answered Tobacco comments: smokes a couple of times a week   Clinical Intake:  Pre-visit preparation completed: Yes  Pain : 0-10 Pain Score: 10-Worst pain ever Pain Type: Chronic pain Pain Location: Back Pain Orientation: Lower, Medial, Lateral Pain Descriptors / Indicators: Discomfort, Constant Pain Onset: More than a month ago Pain Frequency: Constant Pain Relieving Factors: pain management, oxycodone Effect of Pain on Daily Activities: Pain can diminish job performance, lower motivation to exercise, and prevent you from completing daily tasks. Pain produces disability and affects the quality of life.  Pain Relieving Factors: pain management, oxycodone  Nutritional Risks: None Diabetes: No  How often do you need to have  someone help you when you read instructions, pamphlets, or other written materials from your doctor or pharmacy?: 1 - Never What is the last grade level you completed in school?: refused  Diabetic? no  Interpreter Needed?: No  Information entered by :: Lisette Abu, LPN.   Activities of Daily Living In your present state of health, do you have any difficulty performing the following activities: 09/30/2021  Hearing? N  Vision? N  Difficulty concentrating or making decisions? Y  Walking or climbing stairs? N  Dressing or bathing? N  Doing errands, shopping? N  Preparing Food and eating ? N  Using the Toilet? N  In the past six months, have you accidently leaked urine? N  Do you have problems with loss of bowel control? N  Managing your Medications? N  Managing your Finances? N  Housekeeping or managing your Housekeeping? N  Some recent data might be hidden    Patient Care Team: Biagio Borg, MD as PCP - General (Internal Medicine)  Indicate any recent Medical Services you may have received from other than [facility] providers in the past year (date may be approximate).     Assessment:   This is a routine wellness examination for Edward Chen.  Hearing/Vision screen Hearing Screening - Comments:: Patient denied any hearing difficulty.   No hearing aids.  Vision Screening - Comments:: Patient wears readers for small print.  No recent eye exam.  Dietary issues and exercise activities discussed: Current Exercise Habits: The patient does not participate in regular exercise at present, Exercise limited by: orthopedic condition(s);neurologic condition(s);respiratory conditions(s)   Goals Addressed               This Visit's Progress     Patient Stated (pt-stated)        Patient declined health goal at this time.      Depression Screen PHQ 2/9 Scores 09/30/2021 03/12/2021 09/16/2020 09/14/2020 03/10/2020 03/10/2020 09/10/2019  PHQ - 2 Score 0 0 0 0 0 0 0  PHQ- 9 Score - 0 - - - 0 -     Fall Risk Fall Risk  09/30/2021 03/12/2021 09/16/2020 09/14/2020 03/10/2020  Falls in the past year? 0 0 0 0 0  Number falls in past yr: 0 0 - 0 -  Injury with Fall? 0 0 - 0 -  Risk for fall due to : No Fall Risks - - No Fall Risks -  Follow up Falls evaluation completed - - Falls evaluation completed -    FALL RISK PREVENTION PERTAINING TO THE HOME:  Any stairs in or around the home? No  If so, are there any without handrails? No  Home free of loose throw rugs in walkways, pet beds, electrical cords, etc? Yes  Adequate lighting in your home to reduce risk of falls? Yes   ASSISTIVE DEVICES UTILIZED TO PREVENT FALLS:  Life alert? No  Use of a cane, walker or w/c? Yes  Grab bars in the bathroom? No  Shower chair or bench in shower? No  Elevated toilet seat or a handicapped toilet? Yes   TIMED UP AND GO:  Was the test performed? No .  Length of time to ambulate 10 feet: n/a sec.   Gait steady and fast with assistive device  Cognitive Function: Normal cognitive status assessed by direct observation by this Nurse Health Advisor. No abnormalities found.          Immunizations Immunization History  Administered Date(s) Administered   Influenza Whole 06/12/2004   PFIZER(Purple Top)SARS-COV-2 Vaccination 12/30/2019, 01/20/2020   Pneumococcal Polysaccharide-23 10/12/2009   Td 09/13/2011    TDAP status: Due, Education has been provided regarding the importance of this vaccine. Advised may receive this vaccine at local pharmacy or Health Dept. Aware to provide a copy of the vaccination record if obtained from local pharmacy or Health Dept. Verbalized acceptance and understanding.  Flu Vaccine status: Declined, Education has been provided regarding the importance of this vaccine but patient still declined. Advised may receive this vaccine at local pharmacy or Health Dept. Aware to provide a copy of the vaccination record if obtained from local pharmacy or Health Dept. Verbalized  acceptance and understanding.  Pneumococcal vaccine status: Declined,  Education has been provided regarding the importance of this vaccine but patient still declined. Advised may receive this vaccine at local pharmacy or Health Dept. Aware to provide a copy of the vaccination record if obtained from local pharmacy or Health Dept. Verbalized acceptance and understanding.   Covid-19 vaccine status: Completed vaccines  Qualifies for Shingles Vaccine? Yes   Zostavax completed No   Shingrix Completed?: No.    Education has been provided regarding the importance of this vaccine. Patient has been advised to call insurance company to determine out of pocket expense if they have not yet received this vaccine. Advised may also receive vaccine at local pharmacy or Health Dept. Verbalized acceptance and understanding.  Screening Tests Health Maintenance  Topic Date Due   COVID-19 Vaccine (3 - Booster for Pfizer series) 10/02/2021 (Originally 03/16/2020)   INFLUENZA VACCINE  12/10/2021 (Originally 04/12/2021)   Zoster Vaccines- Shingrix (1 of 2) 12/15/2021 (Originally 01/18/2009)   Pneumococcal Vaccine 39-19 Years old (2 - PCV) 09/16/2022 (Originally 10/12/2010)   TETANUS/TDAP  09/16/2022 (Originally 09/12/2021)   COLONOSCOPY (Pts 45-53yrs Insurance coverage will need to be confirmed)  10/05/2027   Hepatitis C Screening  Completed   HIV Screening  Completed   HPV VACCINES  Aged Out    Health Maintenance  There are no preventive care reminders to display for this patient.  Colorectal cancer screening: Type of screening: Colonoscopy. Completed 10/04/2017. Repeat every 10 years  Lung Cancer Screening: (Low Dose CT Chest recommended if Age 85-80 years, 30 pack-year currently smoking OR have quit w/in 15years.) does qualify.   Lung Cancer Screening Referral: no  Additional Screening:  Hepatitis C Screening: does not qualify; Completed no  Vision Screening: Recommended annual ophthalmology exams for early  detection of glaucoma and other disorders of the eye. Is the patient up to date with their annual eye exam?  No  Who is the provider or what is the name of the office in which the patient attends annual eye exams? Patient refused If pt is not established with a provider, would they like to be referred to a provider to establish care? No .   Dental Screening: Recommended annual dental exams for proper oral hygiene  Community Resource Referral / Chronic Care Management: CRR required this visit?  No   CCM required this visit?  No      Plan:     I have personally reviewed and noted the following in the patients chart:   Medical and social history Use of alcohol, tobacco or illicit drugs  Current medications and supplements including opioid prescriptions. Patient is currently taking opioid prescriptions. Information provided to patient regarding non-opioid alternatives. Patient advised to discuss non-opioid treatment plan with their provider. Functional ability and status Nutritional status Physical activity Advanced directives List of other physicians Hospitalizations, surgeries, and ER visits in previous 12 months Vitals Screenings to include cognitive, depression, and falls Referrals and appointments  In addition, I have reviewed and discussed with patient certain preventive protocols, quality metrics, and best practice recommendations. A written personalized care plan for preventive services as well as general preventive health recommendations were provided to patient.     Sheral Flow, LPN   [id number]   Nurse Notes:  Patient is cogitatively intact. There were no vitals filed for this visit. There is no height or weight on file to calculate BMI.

## 2021-10-05 ENCOUNTER — Telehealth: Payer: Self-pay | Admitting: Internal Medicine

## 2021-10-05 ENCOUNTER — Encounter: Payer: Self-pay | Admitting: Internal Medicine

## 2021-10-05 NOTE — Telephone Encounter (Signed)
Patient calling in  Patient would like most recent copy of lab results mailed to address in chart

## 2021-10-06 NOTE — Telephone Encounter (Signed)
Notified patient that labs have been placed in the mail

## 2021-11-23 DIAGNOSIS — R0789 Other chest pain: Secondary | ICD-10-CM | POA: Diagnosis not present

## 2021-11-23 DIAGNOSIS — K5900 Constipation, unspecified: Secondary | ICD-10-CM | POA: Diagnosis not present

## 2021-11-23 DIAGNOSIS — G894 Chronic pain syndrome: Secondary | ICD-10-CM | POA: Diagnosis not present

## 2021-11-26 ENCOUNTER — Other Ambulatory Visit: Payer: Self-pay | Admitting: Internal Medicine

## 2021-11-26 NOTE — Telephone Encounter (Signed)
Please refill as per office routine med refill policy (all routine meds to be refilled for 3 mo or monthly (per pt preference) up to one year from last visit, then month to month grace period for 3 mo, then further med refills will have to be denied) ? ?

## 2021-12-16 ENCOUNTER — Telehealth: Payer: Self-pay | Admitting: Internal Medicine

## 2021-12-16 NOTE — Telephone Encounter (Signed)
Type of form received- Disability  ? ?Form placed in- Provider mailbox ? ?Additional instructions from the patient- Fax when completed [phone]. Please also mail a completed copy to pt house.  ? ?Things to remember: ?Reminded pt that forms can take 7-10 days.  ?

## 2021-12-22 NOTE — Telephone Encounter (Signed)
Paperwork filled out and waiting for signature from provider ?

## 2021-12-27 ENCOUNTER — Other Ambulatory Visit: Payer: Self-pay | Admitting: Internal Medicine

## 2021-12-27 NOTE — Telephone Encounter (Signed)
Pt checking status of forms, informed pt of last note left by cma on 4-12 ? ?Pt requesting a cb w/ a status update ?

## 2021-12-27 NOTE — Telephone Encounter (Signed)
Paperwork faxed and copy placed in mail for patient ?

## 2021-12-27 NOTE — Telephone Encounter (Signed)
Please refill as per office routine med refill policy (all routine meds to be refilled for 3 mo or monthly (per pt preference) up to one year from last visit, then month to month grace period for 3 mo, then further med refills will have to be denied) ? ?

## 2022-01-19 DIAGNOSIS — R0789 Other chest pain: Secondary | ICD-10-CM | POA: Diagnosis not present

## 2022-01-19 DIAGNOSIS — G894 Chronic pain syndrome: Secondary | ICD-10-CM | POA: Diagnosis not present

## 2022-01-19 DIAGNOSIS — K5900 Constipation, unspecified: Secondary | ICD-10-CM | POA: Diagnosis not present

## 2022-02-21 ENCOUNTER — Other Ambulatory Visit: Payer: Self-pay | Admitting: Internal Medicine

## 2022-02-21 NOTE — Telephone Encounter (Signed)
Please refill as per office routine med refill policy (all routine meds to be refilled for 3 mo or monthly (per pt preference) up to one year from last visit, then month to month grace period for 3 mo, then further med refills will have to be denied) ? ?

## 2022-02-22 ENCOUNTER — Other Ambulatory Visit: Payer: Self-pay

## 2022-02-22 MED ORDER — OMEPRAZOLE 20 MG PO CPDR: 20.0000 mg | DELAYED_RELEASE_CAPSULE | Freq: Every day | ORAL | 0 refills | Status: DC

## 2022-03-22 ENCOUNTER — Ambulatory Visit: Payer: Medicare Other | Admitting: Internal Medicine

## 2022-03-22 ENCOUNTER — Other Ambulatory Visit: Payer: Self-pay | Admitting: Internal Medicine

## 2022-03-22 NOTE — Telephone Encounter (Signed)
Please refill as per office routine med refill policy (all routine meds to be refilled for 3 mo or monthly (per pt preference) up to one year from last visit, then month to month grace period for 3 mo, then further med refills will have to be denied) ? ?

## 2022-03-25 DIAGNOSIS — G894 Chronic pain syndrome: Secondary | ICD-10-CM | POA: Diagnosis not present

## 2022-03-25 DIAGNOSIS — R0789 Other chest pain: Secondary | ICD-10-CM | POA: Diagnosis not present

## 2022-03-25 DIAGNOSIS — Z79891 Long term (current) use of opiate analgesic: Secondary | ICD-10-CM | POA: Diagnosis not present

## 2022-03-25 DIAGNOSIS — K5900 Constipation, unspecified: Secondary | ICD-10-CM | POA: Diagnosis not present

## 2022-04-04 ENCOUNTER — Ambulatory Visit (INDEPENDENT_AMBULATORY_CARE_PROVIDER_SITE_OTHER): Payer: Medicare Other | Admitting: Internal Medicine

## 2022-04-04 ENCOUNTER — Encounter: Payer: Self-pay | Admitting: Internal Medicine

## 2022-04-04 VITALS — BP 128/76 | HR 67 | Temp 97.9°F | Ht 67.0 in | Wt 146.0 lb

## 2022-04-04 DIAGNOSIS — R739 Hyperglycemia, unspecified: Secondary | ICD-10-CM | POA: Diagnosis not present

## 2022-04-04 DIAGNOSIS — J309 Allergic rhinitis, unspecified: Secondary | ICD-10-CM

## 2022-04-04 DIAGNOSIS — E559 Vitamin D deficiency, unspecified: Secondary | ICD-10-CM

## 2022-04-04 DIAGNOSIS — E785 Hyperlipidemia, unspecified: Secondary | ICD-10-CM

## 2022-04-04 DIAGNOSIS — E538 Deficiency of other specified B group vitamins: Secondary | ICD-10-CM

## 2022-04-04 DIAGNOSIS — I10 Essential (primary) hypertension: Secondary | ICD-10-CM | POA: Diagnosis not present

## 2022-04-04 LAB — LIPID PANEL
Cholesterol: 159 mg/dL (ref 0–200)
HDL: 52.2 mg/dL (ref >39.00)
LDL Cholesterol: 91 mg/dL (ref 0–99)
NonHDL: 106.44
Total CHOL/HDL Ratio: 3
Triglycerides: 79 mg/dL (ref 0.0–149.0)
VLDL: 15.8 mg/dL (ref 0.0–40.0)

## 2022-04-04 LAB — HEMOGLOBIN A1C: Hgb A1c MFr Bld: 5.3 % (ref 4.6–6.5)

## 2022-04-04 LAB — HEPATIC FUNCTION PANEL
ALT: 14 U/L (ref 0–53)
AST: 15 U/L (ref 0–37)
Albumin: 4.4 g/dL (ref 3.5–5.2)
Alkaline Phosphatase: 56 U/L (ref 39–117)
Bilirubin, Direct: 0.1 mg/dL (ref 0.0–0.3)
Total Bilirubin: 0.6 mg/dL (ref 0.2–1.2)
Total Protein: 6.7 g/dL (ref 6.0–8.3)

## 2022-04-04 LAB — BASIC METABOLIC PANEL
BUN: 17 mg/dL (ref 6–23)
CO2: 28 mEq/L (ref 19–32)
Calcium: 9 mg/dL (ref 8.4–10.5)
Chloride: 103 mEq/L (ref 96–112)
Creatinine, Ser: 0.99 mg/dL (ref 0.40–1.50)
GFR: 81.24 mL/min (ref >60.00)
Glucose, Bld: 112 mg/dL — ABNORMAL HIGH (ref 70–99)
Potassium: 4 mEq/L (ref 3.5–5.1)
Sodium: 138 mEq/L (ref 135–145)

## 2022-04-04 MED ORDER — CLOTRIMAZOLE-BETAMETHASONE 1-0.05 % EX CREA
1.0000 | TOPICAL_CREAM | Freq: Every day | CUTANEOUS | 0 refills | Status: DC
Start: 2022-04-04 — End: 2022-05-25

## 2022-04-04 NOTE — Progress Notes (Signed)
Patient ID: Edward Chen., male   DOB: 1961-05-13, 61 y.o.   MRN: [id number]        Chief Complaint: follow up HTN, HLD and hyperglycemia, allergies and left ear pressure, low vit d and B12       HPI:  [name]. is a 61 y.o. male here for f/u - Going to pain clinic every other month for polyarthritis.  Benadryl qhs prn works ok fo rsleep.  Using xanax sparingly for panic.  Needs to have right knee TKR but has been puttig it off for now, pain not bad enough now.  Needs back surgury but not doing that as well.  Cortisone seemed to hurt him so he wont do that again.  Also gets cortisone to lateral epicondyles but even that hurts too much to him.  Needs lotrisone refill for itchy red rash to face that keeps recurring.  Pt denies chest pain, increased sob or doe, wheezing, orthopnea, PND, increased LE swelling, palpitations, dizziness or syncope.   Pt denies polydipsia, polyuria, or new focal neuro s/s.   Does have several wks ongoing nasal allergy symptoms with clearish congestion, itch and sneezing, without fever, pain, ST, cough, swelling or wheezing, but did also have left ear pressure and fullness x 3 days, wondering if needs other tx or antibx Wt Readings from Last 3 Encounters:  04/04/22 146 lb (66.2 kg)  09/16/21 147 lb 9.6 oz (67 kg)  03/12/21 145 lb (65.8 kg)   BP Readings from Last 3 Encounters:  04/04/22 128/76  09/16/21 110/60  03/12/21 120/70         Past Medical History:  Diagnosis Date   Abnormal drug screen 09/2014   neg xanax (but PRN use), pos EtOH   Acute asthmatic bronchitis    Allergic rhinitis    Allergy    Anxiety    with h/o attacks "on xanax for chest pain"   Asthma 11/16/2015   Benign hypertension    Borderline diabetes mellitus    Cervical disc disease 11/16/2015   Chronic pain syndrome    [facility] - percocets 7.'5mg'$    Depression    Depression with anxiety 10/29/2017   DJD (degenerative joint disease)    Erectile dysfunction 10/29/2017    Gastroparesis    GERD (gastroesophageal reflux disease)    Habitual alcohol use    Hyperlipidemia    Lumbar disc disease 11/16/2015   Palpitations    Past Surgical History:  Procedure Laterality Date   APPENDECTOMY     CERVICAL DISCECTOMY  4/06   decompression, foraminotomy, and fusion Dr. Joya Salm    COLONOSCOPY  11/2012   1 TA, mod diverticulosis, rpt 5 yrs Deatra Ina)   EAR CYST EXCISION Right 04/30/2015   Procedure: CYST REMOVAL Right knee;  Surgeon: Ninetta Lights, MD;  Location: [facility];  Service: Orthopedics;  Laterality: Right;   ESOPHAGOGASTRODUODENOSCOPY  02/2014   gastric polyps, retained gastric residue s/p dilatation Deatra Ina)   KNEE ARTHROSCOPY WITH LATERAL MENISECTOMY Right 04/30/2015   Procedure: RIGHT KNEE ARTHROSCOPY;  Surgeon: Ninetta Lights, MD;  Location: [facility];  Service: Orthopedics;  Laterality: Right;   KNEE ARTHROSCOPY WITH MENISCAL REPAIR Right 04/30/2015   Procedure: KNEE ARTHROSCOPY WITH OPEN MENISCAL REPAIR;  Surgeon: Ninetta Lights, MD;  Location: [facility];  Service: Orthopedics;  Laterality: Right;   SHOULDER SURGERY Right 11/05   Arthroscopy, debridement, distal resection   SHOULDER SURGERY Left 05/2011   Dr.  Murphy   UPPER GASTROINTESTINAL ENDOSCOPY     X3    reports that he quit smoking about 17 months ago. His smoking use included cigarettes. He has never used smokeless tobacco. He reports current alcohol use of about 12.0 standard drinks of alcohol per week. He reports that he does not use drugs. family history includes Lung disease in his father. Allergies  Allergen Reactions   Other Anaphylaxis    BLACKBERRIES   Peanuts [Peanut Oil] Anaphylaxis   Shellfish Allergy Anaphylaxis   Betadine [Povidone Iodine] Other (See Comments)    Due to shellfish allergy    Lexapro [Escitalopram Oxalate] Other (See Comments)   Nexium [Esomeprazole Magnesium] Other (See Comments)    Chest pain, does ok with  prilosec   Current Outpatient Medications on File Prior to Visit  Medication Sig Dispense Refill   albuterol (PROVENTIL HFA;VENTOLIN HFA) 108 (90 Base) MCG/ACT inhaler Inhale 2 puffs into the lungs every 6 (six) hours as needed for wheezing or shortness of breath. 1 Inhaler 11   ALPRAZolam (XANAX) 0.5 MG tablet Take 1 tablet (0.5 mg total) by mouth daily as needed. 90 tablet 1   aspirin EC 81 MG tablet Take 81 mg by mouth daily.     atorvastatin (LIPITOR) 10 MG tablet TAKE 1 TABLET BY MOUTH EVERY DAY 90 tablet 2   baclofen (LIORESAL) 10 MG tablet Take 10 mg by mouth as needed for muscle spasms.     buPROPion (WELLBUTRIN) 100 MG tablet 1 tablet by mouth three times a day     omeprazole (PRILOSEC) 20 MG capsule TAKE 1 CAPSULE BY MOUTH EVERY DAY 30 capsule 0   oxyCODONE-acetaminophen (PERCOCET) 7.5-325 MG per tablet Take as directed by pain management     sildenafil (VIAGRA) 100 MG tablet Take 1/2 to 1 tablet by mouth daily as needed for erectile dysfunction. 20 tablet 4   telmisartan (MICARDIS) 80 MG tablet TAKE 1 TABLET BY MOUTH EVERY DAY 90 tablet 2   lidocaine (LIDODERM) 5 % Place 1 patch onto the skin daily. Remove & Discard patch within 12 hours or as directed by MD (Patient not taking: Reported on 04/04/2022) 40 patch 1   nitroGLYCERIN (NITRODUR - DOSED IN MG/24 HR) 0.2 mg/hr patch Apply 1/4 patch to skin once a day  as directed (Patient not taking: Reported on 03/12/2021)     No current facility-administered medications on file prior to visit.        ROS:  All others reviewed and negative.  Objective        PE:  BP 128/76 (BP Location: Right Arm, Patient Position: Sitting, Cuff Size: Large)   Pulse 67   Temp 97.9 F (36.6 C) (Oral)   Ht '5\' 7"'$  (1.702 m)   Wt 146 lb (66.2 kg)   SpO2 100%   BMI 22.87 kg/m                 Constitutional: Pt appears in NAD               HENT: Head: NCAT.                Right Ear: External ear normal.                 Left Ear: External ear normal.  Bilat tm's with mild erythema.  Max sinus areas non tender.  Pharynx with mild erythema, no exudate               Eyes: .  Pupils are equal, round, and reactive to light. Conjunctivae and EOM are normal               Nose: without d/c or deformity               Neck: Neck supple. Gross normal ROM               Cardiovascular: Normal rate and regular rhythm.                 Pulmonary/Chest: Effort normal and breath sounds without rales or wheezing.                Abd:  Soft, NT, ND, + BS, no organomegaly               Neurological: Pt is alert. At baseline orientation, motor grossly intact               Skin: Skin is warm. No current rashes, no other new lesions, LE edema - none               Psychiatric: Pt behavior is normal without agitation   Micro: none  Cardiac tracings I have personally interpreted today:  none  Pertinent Radiological findings (summarize): none   Lab Results  Component Value Date   WBC 6.0 09/16/2021   HGB 13.9 09/16/2021   HCT 40.0 09/16/2021   PLT 152.0 09/16/2021   GLUCOSE 115 (H) 09/16/2021   CHOL 152 09/16/2021   TRIG 228.0 (H) 09/16/2021   HDL 43.60 09/16/2021   LDLDIRECT 86.0 09/16/2021   LDLCALC 72 03/12/2021   ALT 15 09/16/2021   AST 15 09/16/2021   NA 139 09/16/2021   K 4.2 09/16/2021   CL 104 09/16/2021   CREATININE 1.10 09/16/2021   BUN 16 09/16/2021   CO2 30 09/16/2021   TSH 1.44 09/16/2021   PSA 0.85 09/16/2021   HGBA1C 5.1 09/16/2021   Assessment/Plan:  Siaosi Alter. is a 61 y.o. White or Caucasian [1] male with  has a past medical history of Abnormal drug screen (09/2014), Acute asthmatic bronchitis, Allergic rhinitis, Allergy, Anxiety, Asthma (11/16/2015), Benign hypertension, Borderline diabetes mellitus, Cervical disc disease (11/16/2015), Chronic pain syndrome, Depression, Depression with anxiety (10/29/2017), DJD (degenerative joint disease), Erectile dysfunction (10/29/2017), Gastroparesis, GERD (gastroesophageal reflux disease),  Habitual alcohol use, Hyperlipidemia, Lumbar disc disease (11/16/2015), and Palpitations.  Vitamin D deficiency Last vitamin D Lab Results  Component Value Date   VD25OH 34.83 09/16/2021   Low, to start oral replacement   B12 deficiency Lab Results  Component Value Date   [id number] 119 09/16/2021   Low, to start oral replacement - b12 1000 mcg qd   HYPERTENSION, BENIGN BP Readings from Last 3 Encounters:  04/04/22 128/76  09/16/21 110/60  03/12/21 120/70   Stable, pt to continue medical treatment micardis 80 mg qd   Hyperglycemia Lab Results  Component Value Date   HGBA1C 5.1 09/16/2021   Stable, pt to continue current medical treatment  - diet, wt control   Dyslipidemia Lab Results  Component Value Date   LDLCALC 72 03/12/2021   Stable, pt to continue current statin lipitor 10 mg qd   Allergic rhinitis Pt encourage to restart clarinex daily 10 mg, and also mucinex prn ear pressure  Followup: Return in about 6 months (around 10/05/2022).  Cathlean Cower, MD 04/04/2022 8:37 AM [facility]

## 2022-04-04 NOTE — Assessment & Plan Note (Signed)
Last vitamin D Lab Results  Component Value Date   VD25OH 34.83 09/16/2021   Low, to start oral replacement  

## 2022-04-04 NOTE — Assessment & Plan Note (Signed)
Lab Results  Component Value Date   HGBA1C 5.1 09/16/2021   Stable, pt to continue current medical treatment  - diet, wt control

## 2022-04-04 NOTE — Assessment & Plan Note (Signed)
Lab Results  Component Value Date   [id number] 078 09/16/2021   Low, to start oral replacement - b12 1000 mcg qd

## 2022-04-04 NOTE — Assessment & Plan Note (Signed)
Pt encourage to restart clarinex daily 10 mg, and also mucinex prn ear pressure

## 2022-04-04 NOTE — Assessment & Plan Note (Signed)
Lab Results  Component Value Date   LDLCALC 72 03/12/2021   Stable, pt to continue current statin lipitor 10 mg qd

## 2022-04-04 NOTE — Patient Instructions (Addendum)
Please take OTC Vitamin D3 at 2000 units per day, indefinitely  Please also take the OTC B12 at 1000 mcg per day for at least 6 months  OK to take the Clarinex for allergies, and Mucinex for the ear pressure as needed  Please continue all other medications as before, and refills have been done if requested.  Please have the pharmacy call with any other refills you may need.  Please continue your efforts at being more active, low cholesterol diet, and weight control.  Please keep your appointments with your specialists as you may have planned  Please go to the LAB at the blood drawing area for the tests to be done  You will be contacted by phone if any changes need to be made immediately.  Otherwise, you will receive a letter about your results with an explanation, but please check with MyChart first.  Please remember to sign up for MyChart if you have not done so, as this will be important to you in the future with finding out test results, communicating by private email, and scheduling acute appointments online when needed.  Please make an Appointment to return in 6 months, or sooner if needed

## 2022-04-04 NOTE — Assessment & Plan Note (Signed)
BP Readings from Last 3 Encounters:  04/04/22 128/76  09/16/21 110/60  03/12/21 120/70   Stable, pt to continue medical treatment micardis 80 mg qd

## 2022-05-04 ENCOUNTER — Telehealth: Payer: Self-pay | Admitting: Internal Medicine

## 2022-05-04 NOTE — Telephone Encounter (Signed)
Patient is requesting call back with results from his appointment last month. [phone]

## 2022-05-12 NOTE — Telephone Encounter (Signed)
Spoke with patient regarding results from last month, and he stated that he never received a call. Patient has requested a print out to be sent in the mail.

## 2022-05-13 ENCOUNTER — Telehealth: Payer: Self-pay | Admitting: Internal Medicine

## 2022-05-13 NOTE — Telephone Encounter (Signed)
Patient would like his sildinifil sent in to [facility]

## 2022-05-19 ENCOUNTER — Other Ambulatory Visit: Payer: Self-pay

## 2022-05-19 MED ORDER — SILDENAFIL CITRATE 100 MG PO TABS: ORAL_TABLET | ORAL | 4 refills | Status: DC

## 2022-05-25 ENCOUNTER — Other Ambulatory Visit: Payer: Self-pay | Admitting: Internal Medicine

## 2022-05-25 DIAGNOSIS — K5900 Constipation, unspecified: Secondary | ICD-10-CM | POA: Diagnosis not present

## 2022-05-25 DIAGNOSIS — G894 Chronic pain syndrome: Secondary | ICD-10-CM | POA: Diagnosis not present

## 2022-05-25 DIAGNOSIS — R0789 Other chest pain: Secondary | ICD-10-CM | POA: Diagnosis not present

## 2022-06-21 ENCOUNTER — Other Ambulatory Visit: Payer: Self-pay | Admitting: Internal Medicine

## 2022-06-21 NOTE — Telephone Encounter (Signed)
Please refill as per office routine med refill policy (all routine meds to be refilled for 3 mo or monthly (per pt preference) up to one year from last visit, then month to month grace period for 3 mo, then further med refills will have to be denied) ? ?

## 2022-06-26 ENCOUNTER — Emergency Department (HOSPITAL_BASED_OUTPATIENT_CLINIC_OR_DEPARTMENT_OTHER)
Admission: EM | Admit: 2022-06-26 | Discharge: 2022-06-26 | Disposition: A | Discharge status: Home / Self Care | Payer: Medicare Other | Attending: Emergency Medicine | Admitting: Emergency Medicine

## 2022-06-26 ENCOUNTER — Encounter (HOSPITAL_BASED_OUTPATIENT_CLINIC_OR_DEPARTMENT_OTHER): Payer: Self-pay

## 2022-06-26 DIAGNOSIS — Z79899 Other long term (current) drug therapy: Secondary | ICD-10-CM | POA: Diagnosis not present

## 2022-06-26 DIAGNOSIS — Z91010 Allergy to peanuts: Secondary | ICD-10-CM | POA: Insufficient documentation

## 2022-06-26 DIAGNOSIS — Z7982 Long term (current) use of aspirin: Secondary | ICD-10-CM | POA: Diagnosis not present

## 2022-06-26 DIAGNOSIS — M792 Neuralgia and neuritis, unspecified: Secondary | ICD-10-CM | POA: Diagnosis not present

## 2022-06-26 DIAGNOSIS — I10 Essential (primary) hypertension: Secondary | ICD-10-CM | POA: Insufficient documentation

## 2022-06-26 DIAGNOSIS — M5481 Occipital neuralgia: Secondary | ICD-10-CM | POA: Insufficient documentation

## 2022-06-26 DIAGNOSIS — R519 Headache, unspecified: Secondary | ICD-10-CM | POA: Diagnosis present

## 2022-06-26 MED ORDER — KETOROLAC TROMETHAMINE 60 MG/2ML IM SOLN
30.0000 mg | Freq: Once | INTRAMUSCULAR | Status: AC
  Administered 2022-06-26: 30 mg via INTRAMUSCULAR
  Filled 2022-06-26: qty 2

## 2022-06-26 MED ORDER — LIDOCAINE 5 % EX PTCH
1.0000 | MEDICATED_PATCH | CUTANEOUS | Status: DC
  Filled 2022-06-26: qty 1

## 2022-06-26 MED ORDER — LIDOCAINE 5 % EX PTCH: 1.0000 | MEDICATED_PATCH | CUTANEOUS | 0 refills | Status: DC

## 2022-06-26 MED ORDER — OXYCODONE-ACETAMINOPHEN 5-325 MG PO TABS
2.0000 | ORAL_TABLET | Freq: Once | ORAL | Status: AC
  Administered 2022-06-26: 2 via ORAL
  Filled 2022-06-26: qty 2

## 2022-06-26 MED ORDER — CYCLOBENZAPRINE HCL 10 MG PO TABS
10.0000 mg | ORAL_TABLET | Freq: Once | ORAL | Status: AC
  Administered 2022-06-26: 10 mg via ORAL
  Filled 2022-06-26: qty 1

## 2022-06-26 MED ORDER — BUPIVACAINE HCL 0.5 % IJ SOLN
20.0000 mL | Freq: Once | INTRAMUSCULAR | Status: AC
  Administered 2022-06-26: 20 mL
  Filled 2022-06-26: qty 1

## 2022-06-26 NOTE — ED Notes (Signed)
Dc instructions reviewed with patient. Patient voiced understanding. Dc with belongings. Pt waiting to speak to MD

## 2022-06-26 NOTE — Discharge Instructions (Addendum)
Please follow-up with your PCP and orthopedist as needed and continue your outpatient pain regimen

## 2022-06-26 NOTE — ED Triage Notes (Signed)
Pt reports he has had some right sided neck pain that is also causing a headache. Pt states the pain started about 1 week ago after lifting some heavy gas jugs. Pt states his percocet that he takes for chronic pain is not helping. Pt also took a xanax pta. Pt alert and oriented x 4, nad, pt able to move head side to side and up and down.

## 2022-06-26 NOTE — ED Provider Notes (Signed)
[facility] EMERGENC[facility] DEPT Provider Note   CSN: [id number] Arrival date & time: 06/26/22  7672     History  Chief Complaint  Patient presents with   Neck Pain    Edward Chen. is a 61 y.o. male.   Neck Pain    61 year old male with medical history significant for HTN, HLD, GERD, degenerative joint disease, chronic pain syndrome follows outpatient with [facility], cervical disc disease status post cervical discectomy with decompression foraminotomy and fusion in 2006 who presents to the emergency department with neck pain and headache.  The patient states that for the last few days he has had pain in his right neck that radiates to his face.  He denies any vision changes, vision loss, neurologic deficits.  He states that the pain started around 1 week ago after lifting some heavy jugs.  He has been taking Percocet at home which has not helped.  He is able to range his neck left and right, up and down without any difficulty.  He denies any new numbness, weakness, urinary or fecal incontinence, saddle anesthesia.  The pain starts around the occiput and radiates to her at his forehead and the right side of his face.  He has a history of follow-up with his orthopedist tomorrow but could not wait.  Home Medications Prior to Admission medications   Medication Sig Start Date End Date Taking? Authorizing Provider  lidocaine (LIDODERM) 5 % Place 1 patch onto the skin daily. Remove & Discard patch within 12 hours or as directed by MD 06/26/22  Yes Regan Lemming, MD  albuterol (PROVENTIL HFA;VENTOLIN HFA) 108 (90 Base) MCG/ACT inhaler Inhale 2 puffs into the lungs every 6 (six) hours as needed for wheezing or shortness of breath. 09/16/16   Biagio Borg, MD  ALPRAZolam Duanne Moron) 0.5 MG tablet Take 1 tablet (0.5 mg total) by mouth daily as needed. 01/01/21   Biagio Borg, MD  aspirin EC 81 MG tablet Take 81 mg by mouth daily.    [provider]  atorvastatin  (LIPITOR) 10 MG tablet TAKE 1 TABLET BY MOUTH EVERY DAY 12/27/21   Biagio Borg, MD  baclofen (LIORESAL) 10 MG tablet Take 10 mg by mouth as needed for muscle spasms.    [provider]  buPROPion [facility]) 100 MG tablet 1 tablet by mouth three times a day 08/03/20   [provider]  clotrimazole-betamethasone (LOTRISONE) cream APPLY 1 APPLICATION TOPICALLY DAILY 05/25/22   Biagio Borg, MD  nitroGLYCERIN (NITRODUR - DOSED IN MG/24 HR) 0.2 mg/hr patch Apply 1/4 patch to skin once a day  as directed Patient not taking: Reported on 03/12/2021 05/27/20   [provider]  omeprazole (PRILOSEC) 20 MG capsule TAKE 1 CAPSULE BY MOUTH EVERY DAY 06/22/22   Biagio Borg, MD  oxyCODONE-acetaminophen (PERCOCET) 7.5-325 MG per tablet Take as directed by pain management    [provider]  sildenafil (VIAGRA) 100 MG tablet Take 1/2 to 1 tablet by mouth daily as needed for erectile dysfunction. 05/19/22   Biagio Borg, MD  telmisartan (MICARDIS) 80 MG tablet TAKE 1 TABLET BY MOUTH EVERY DAY 12/27/21   Biagio Borg, MD      Allergies    Other, Peanuts [peanut oil], Shellfish allergy, Betadine [povidone iodine], Lexapro [escitalopram oxalate], and Nexium [esomeprazole magnesium]    Review of Systems   Review of Systems  Musculoskeletal:  Positive for neck pain.  All other systems reviewed and are negative.  Physical Exam Updated Vital Signs BP 137/85   Pulse 66   Temp 97.9 F (36.6 C) (Oral)   Resp 16   Ht '5\' 7"'$  (1.702 m)   Wt 67.1 kg   SpO2 100%   BMI 23.18 kg/m  Physical Exam Vitals and nursing note reviewed.  Constitutional:      General: He is not in acute distress.    Appearance: He is well-developed.  HENT:     Head: Normocephalic and atraumatic.  Eyes:     Conjunctiva/sclera: Conjunctivae normal.  Neck:     Comments: No midline tenderness to palpation, intact active and passive range of motion Cardiovascular:     Rate and Rhythm: Normal rate and  regular rhythm.  Pulmonary:     Effort: Pulmonary effort is normal. No respiratory distress.     Breath sounds: Normal breath sounds.  Abdominal:     Palpations: Abdomen is soft.     Tenderness: There is no abdominal tenderness.  Musculoskeletal:        General: No swelling.     Cervical back: Normal range of motion and neck supple. No tenderness.  Skin:    General: Skin is warm and dry.     Capillary Refill: Capillary refill takes less than 2 seconds.  Neurological:     General: No focal deficit present.     Mental Status: He is alert and oriented to person, place, and time. Mental status is at baseline.     Cranial Nerves: No cranial nerve deficit.     Sensory: No sensory deficit.     Motor: No weakness.  Psychiatric:        Mood and Affect: Mood normal.     ED Results / Procedures / Treatments   Labs (all labs ordered are listed, but only abnormal results are displayed) Labs Reviewed - No data to display  EKG None  Radiology No results found.  Procedures .Nerve Block  Date/Time: 06/26/2022 11:59 AM  Performed by: Regan Lemming, MD Authorized by: Regan Lemming, MD   Consent:    Consent obtained:  Verbal   Consent given by:  Patient   Risks discussed:  Allergic reaction, bleeding and infection Universal protocol:    Patient identity confirmed:  Verbally with patient Indications:    Indications:  Pain relief Location:    Body area:  Head   Head nerve:  Occipital   Laterality:  Right Pre-procedure details:    Skin preparation:  Chlorhexidine   Preparation: Patient was prepped and draped in usual sterile fashion   Skin anesthesia:    Skin anesthesia method:  None Procedure details:    Block needle gauge:  25 G   Anesthetic injected:  Bupivacaine 0.5% w/o epi   Steroid injected:  None   Additive injected:  None   Injection procedure:  Anatomic landmarks identified, incremental injection, negative aspiration for blood, anatomic landmarks palpated and  introduced needle   Paresthesia:  None Post-procedure details:    Dressing:  None   Outcome:  Pain relieved   Procedure completion:  Tolerated     Medications Ordered in ED Medications  ketorolac (TORADOL) injection 30 mg (30 mg Intramuscular Given 06/26/22 1127)  cyclobenzaprine (FLEXERIL) tablet 10 mg (10 mg Oral Given 06/26/22 1125)  oxyCODONE-acetaminophen (PERCOCET/ROXICET) 5-325 MG per tablet 2 tablet (2 tablets Oral Given 06/26/22 1126)  bupivacaine (MARCAINE) 0.5 % (with pres) injection 20 mL (20 mLs Infiltration Given by Other 06/26/22 1138)    ED Course/ Medical Decision Making/  A&P                           Medical Decision Making Risk Prescription drug management.    61 year old male with medical history significant for HTN, HLD, GERD, degenerative joint disease, chronic pain syndrome follows outpatient with [facility], cervical disc disease status post cervical discectomy with decompression foraminotomy and fusion in 2006 who presents to the emergency department with neck pain and headache.  The patient states that for the last few days he has had pain in his right neck that radiates to his face.  He denies any vision changes, vision loss, neurologic deficits.  He states that the pain started around 1 week ago after lifting some heavy jugs.  He has been taking Percocet at home which has not helped.  He is able to range his neck left and right, up and down without any difficulty.  He denies any new numbness, weakness, urinary or fecal incontinence, saddle anesthesia.  The pain starts around the occiput and radiates to her at his forehead and the right side of his face.  He has a history of follow-up with his orthopedist tomorrow but could not wait.  On arrival, the patient was vitally stable.  Physical exam generally unremarkable with a normal neurologic exam.  Patient has pain about the right occipital radiating to his right temple and face.  Symptoms are consistent  with occipital neuralgia.  Patient has no midline tenderness to palpation of the cervical spine and intact range of motion of the neck actively and passively.  Patient was administered his home Percocet, IM Toradol, Flexeril for muscle spasm.  The patient was consented for nerve block of the occipital nerve on the right which was subsequently performed with bupivacaine 0.5%.  This resulted in complete resolution of the patient's pain.  Symptoms are consistent with occipital neuralgia.  Patient was advised to follow-up outpatient as needed, overall stable for discharge at this time.   Final Clinical Impression(s) / ED Diagnoses Final diagnoses:  Occipital neuralgia of right side    Rx / DC Orders ED Discharge Orders          Ordered    lidocaine (LIDODERM) 5 %  Every 24 hours        06/26/22 1219              Regan Lemming, MD 06/27/22 [phone]

## 2022-06-27 DIAGNOSIS — M542 Cervicalgia: Secondary | ICD-10-CM | POA: Diagnosis not present

## 2022-07-01 DIAGNOSIS — M542 Cervicalgia: Secondary | ICD-10-CM | POA: Diagnosis not present

## 2022-07-04 ENCOUNTER — Ambulatory Visit (INDEPENDENT_AMBULATORY_CARE_PROVIDER_SITE_OTHER): Payer: Medicare Other | Admitting: Internal Medicine

## 2022-07-04 ENCOUNTER — Encounter: Payer: Self-pay | Admitting: Internal Medicine

## 2022-07-04 VITALS — BP 126/74 | HR 77 | Temp 97.8°F | Ht 67.0 in | Wt 146.0 lb

## 2022-07-04 DIAGNOSIS — R519 Headache, unspecified: Secondary | ICD-10-CM

## 2022-07-04 DIAGNOSIS — M5481 Occipital neuralgia: Secondary | ICD-10-CM | POA: Diagnosis not present

## 2022-07-04 DIAGNOSIS — I10 Essential (primary) hypertension: Secondary | ICD-10-CM | POA: Diagnosis not present

## 2022-07-04 DIAGNOSIS — S161XXD Strain of muscle, fascia and tendon at neck level, subsequent encounter: Secondary | ICD-10-CM | POA: Diagnosis not present

## 2022-07-04 DIAGNOSIS — S161XXA Strain of muscle, fascia and tendon at neck level, initial encounter: Secondary | ICD-10-CM | POA: Insufficient documentation

## 2022-07-04 DIAGNOSIS — R739 Hyperglycemia, unspecified: Secondary | ICD-10-CM

## 2022-07-04 MED ORDER — CYCLOBENZAPRINE HCL 5 MG PO TABS: 5.0000 mg | ORAL_TABLET | Freq: Three times a day (TID) | ORAL | 1 refills | Status: DC | PRN

## 2022-07-04 MED ORDER — GABAPENTIN 100 MG PO CAPS: 100.0000 mg | ORAL_CAPSULE | Freq: Three times a day (TID) | ORAL | 3 refills | Status: DC

## 2022-07-04 NOTE — Assessment & Plan Note (Signed)
Right scm strain, also improving, for contd flexeril 5 tid prn,  to f/u any worsening symptoms or concerns

## 2022-07-04 NOTE — Patient Instructions (Signed)
Please take all new medication as prescribed  - the gabapentin 100 mg three times per day for Nerve Pain  You will be contacted regarding the referral for: MRI head  Please continue all other medications as before, and refills have been done as requested - the flexeril  Please have the pharmacy call with any other refills you may need.  Please continue your efforts at being more active, low cholesterol diet, and weight control  Please keep your appointments with your specialists as you may have planned

## 2022-07-04 NOTE — Assessment & Plan Note (Signed)
BP Readings from Last 3 Encounters:  07/04/22 126/74  06/26/22 137/85  04/04/22 128/76   Stable, pt to continue medical treatment micardis 80 mg qd

## 2022-07-04 NOTE — Assessment & Plan Note (Signed)
Pt has neuro changes, but will need Head MRI to r/o intracranial disorder

## 2022-07-04 NOTE — Progress Notes (Signed)
Patient ID: Edward Chen., male   DOB: 1960/10/19, 61 y.o.   MRN: [id number]       Chief Complaint: follow up right hemicrania,  headache, occipital neuralgia, right SCM strain       HPI:  Edward Chen. is a 61 y.o. male here with c/o trying to lift a heavy object recently but unfortunately seemed to cause pain and injury to the right neck and head.  Has seen dental with some bone loss noted to the right jaw, but no significant TMJ issues.  Pt has strained and warm right SCM for which he has seen orthopedic, flexeril has helped, and now getting PT starting last Friday. Not really otherwise improved yet, but also seen in ED oct 15 with severe persistent pain to right occiput with radiation to the right hemicranium, severe to start and now mild to mod but still would like pain tx.  Pt did not realize lidoderm was sent by ED to his pharmacy.  Pt denies chest pain, increased sob or doe, wheezing, orthopnea, PND, increased LE swelling, palpitations, dizziness or syncope.   Pt denies polydipsia, polyuria, or new focal neuro s/s.        Wt Readings from Last 3 Encounters:  07/04/22 146 lb (66.2 kg)  06/26/22 148 lb (67.1 kg)  04/04/22 146 lb (66.2 kg)   BP Readings from Last 3 Encounters:  07/04/22 126/74  06/26/22 137/85  04/04/22 128/76         Past Medical History:  Diagnosis Date   Abnormal drug screen 09/2014   neg xanax (but PRN use), pos EtOH   Acute asthmatic bronchitis    Allergic rhinitis    Allergy    Anxiety    with h/o attacks "on xanax for chest pain"   Asthma 11/16/2015   Benign hypertension    Borderline diabetes mellitus    Cervical disc disease 11/16/2015   Chronic pain syndrome    [facility] - percocets 7.'5mg'$    Depression    Depression with anxiety 10/29/2017   DJD (degenerative joint disease)    Erectile dysfunction 10/29/2017   Gastroparesis    GERD (gastroesophageal reflux disease)    Habitual alcohol use    Hyperlipidemia    Lumbar disc disease  11/16/2015   Palpitations    Past Surgical History:  Procedure Laterality Date   APPENDECTOMY     CERVICAL DISCECTOMY  4/06   decompression, foraminotomy, and fusion Dr. Joya Salm    COLONOSCOPY  11/2012   1 TA, mod diverticulosis, rpt 5 yrs Deatra Ina)   EAR CYST EXCISION Right 04/30/2015   Procedure: CYST REMOVAL Right knee;  Surgeon: Ninetta Lights, MD;  Location: [facility];  Service: Orthopedics;  Laterality: Right;   ESOPHAGOGASTRODUODENOSCOPY  02/2014   gastric polyps, retained gastric residue s/p dilatation Deatra Ina)   KNEE ARTHROSCOPY WITH LATERAL MENISECTOMY Right 04/30/2015   Procedure: RIGHT KNEE ARTHROSCOPY;  Surgeon: Ninetta Lights, MD;  Location: [facility];  Service: Orthopedics;  Laterality: Right;   KNEE ARTHROSCOPY WITH MENISCAL REPAIR Right 04/30/2015   Procedure: KNEE ARTHROSCOPY WITH OPEN MENISCAL REPAIR;  Surgeon: Ninetta Lights, MD;  Location: [facility];  Service: Orthopedics;  Laterality: Right;   SHOULDER SURGERY Right 11/05   Arthroscopy, debridement, distal resection   SHOULDER SURGERY Left 05/2011   Dr. Percell Miller   UPPER GASTROINTESTINAL ENDOSCOPY     X3    reports that he quit smoking about 20 months ago.  His smoking use included cigarettes. He has never used smokeless tobacco. He reports current alcohol use of about 12.0 standard drinks of alcohol per week. He reports that he does not use drugs. family history includes Lung disease in his father. Allergies  Allergen Reactions   Other Anaphylaxis    BLACKBERRIES   Peanuts [Peanut Oil] Anaphylaxis   Shellfish Allergy Anaphylaxis   Betadine [Povidone Iodine] Other (See Comments)    Due to shellfish allergy    Lexapro [Escitalopram Oxalate] Other (See Comments)   Nexium [Esomeprazole Magnesium] Other (See Comments)    Chest pain, does ok with prilosec   Current Outpatient Medications on File Prior to Visit  Medication Sig Dispense Refill   albuterol (PROVENTIL  HFA;VENTOLIN HFA) 108 (90 Base) MCG/ACT inhaler Inhale 2 puffs into the lungs every 6 (six) hours as needed for wheezing or shortness of breath. 1 Inhaler 11   ALPRAZolam (XANAX) 0.5 MG tablet Take 1 tablet (0.5 mg total) by mouth daily as needed. 90 tablet 1   aspirin EC 81 MG tablet Take 81 mg by mouth daily.     atorvastatin (LIPITOR) 10 MG tablet TAKE 1 TABLET BY MOUTH EVERY DAY 90 tablet 2   baclofen (LIORESAL) 10 MG tablet Take 10 mg by mouth as needed for muscle spasms.     buPROPion (WELLBUTRIN) 100 MG tablet 1 tablet by mouth three times a day     clotrimazole-betamethasone (LOTRISONE) cream APPLY 1 APPLICATION TOPICALLY DAILY 30 g 0   lidocaine (LIDODERM) 5 % Place 1 patch onto the skin daily. Remove & Discard patch within 12 hours or as directed by MD 30 patch 0   nitroGLYCERIN (NITRODUR - DOSED IN MG/24 HR) 0.2 mg/hr patch      omeprazole (PRILOSEC) 20 MG capsule TAKE 1 CAPSULE BY MOUTH EVERY DAY 30 capsule 2   oxyCODONE-acetaminophen (PERCOCET) 7.5-325 MG per tablet Take as directed by pain management     sildenafil (VIAGRA) 100 MG tablet Take 1/2 to 1 tablet by mouth daily as needed for erectile dysfunction. 20 tablet 4   telmisartan (MICARDIS) 80 MG tablet TAKE 1 TABLET BY MOUTH EVERY DAY 90 tablet 2   No current facility-administered medications on file prior to visit.        ROS:  All others reviewed and negative.  Objective        PE:  BP 126/74 (BP Location: Right Arm, Patient Position: Sitting, Cuff Size: Large)   Pulse 77   Temp 97.8 F (36.6 C) (Oral)   Ht '5\' 7"'$  (1.702 m)   Wt 146 lb (66.2 kg)   SpO2 96%   BMI 22.87 kg/m                 Constitutional: Pt appears in NAD               HENT: Head: NCAT.                Right Ear: External ear normal.                 Left Ear: External ear normal.                Eyes: . Pupils are equal, round, and reactive to light. Conjunctivae and EOM are normal               Nose: without d/c or deformity               Neck:  Neck supple.  Gross normal ROM but tender right SCM               Cardiovascular: Normal rate and regular rhythm.                 Pulmonary/Chest: Effort normal and breath sounds without rales or wheezing.                Abd:  Soft, NT, ND, + BS, no organomegaly               Neurological: Pt is alert. At baseline orientation, motor grossly intact, cn 2-12 intact               Skin: Skin is warm. No rashes, no other new lesions, LE edema - none               Psychiatric: Pt behavior is normal without agitation   Micro: none  Cardiac tracings I have personally interpreted today:  none  Pertinent Radiological findings (summarize): none   Lab Results  Component Value Date   WBC 6.0 09/16/2021   HGB 13.9 09/16/2021   HCT 40.0 09/16/2021   PLT 152.0 09/16/2021   GLUCOSE 112 (H) 04/04/2022   CHOL 159 04/04/2022   TRIG 79.0 04/04/2022   HDL 52.20 04/04/2022   LDLDIRECT 86.0 09/16/2021   LDLCALC 91 04/04/2022   ALT 14 04/04/2022   AST 15 04/04/2022   NA 138 04/04/2022   K 4.0 04/04/2022   CL 103 04/04/2022   CREATININE 0.99 04/04/2022   BUN 17 04/04/2022   CO2 28 04/04/2022   TSH 1.44 09/16/2021   PSA 0.85 09/16/2021   HGBA1C 5.3 04/04/2022   Assessment/Plan:  Isaack Preble. is a 61 y.o. White or Caucasian [1] male with  has a past medical history of Abnormal drug screen (09/2014), Acute asthmatic bronchitis, Allergic rhinitis, Allergy, Anxiety, Asthma (11/16/2015), Benign hypertension, Borderline diabetes mellitus, Cervical disc disease (11/16/2015), Chronic pain syndrome, Depression, Depression with anxiety (10/29/2017), DJD (degenerative joint disease), Erectile dysfunction (10/29/2017), Gastroparesis, GERD (gastroesophageal reflux disease), Habitual alcohol use, Hyperlipidemia, Lumbar disc disease (11/16/2015), and Palpitations.  Occipital neuralgia of right side Improved with time but still mild to mod uncontrolled, now for gabapentin 100 gm tid  Nonintractable headache Pt has  neuro changes, but will need Head MRI to r/o intracranial disorder  Neck muscle strain Right scm strain, also improving, for contd flexeril 5 tid prn,  to f/u any worsening symptoms or concerns  HYPERTENSION, BENIGN BP Readings from Last 3 Encounters:  07/04/22 126/74  06/26/22 137/85  04/04/22 128/76   Stable, pt to continue medical treatment micardis 80 mg qd   Hyperglycemia Lab Results  Component Value Date   HGBA1C 5.3 04/04/2022   Stable, pt to continue current medical treatment  - diet, wt control, excercise  Followup: Return if symptoms worsen or fail to improve.  Cathlean Cower, MD 07/04/2022 12:23 PM [facility]

## 2022-07-04 NOTE — Assessment & Plan Note (Signed)
Improved with time but still mild to mod uncontrolled, now for gabapentin 100 gm tid

## 2022-07-04 NOTE — Assessment & Plan Note (Signed)
Lab Results  Component Value Date   HGBA1C 5.3 04/04/2022   Stable, pt to continue current medical treatment  - diet, wt control, excercise

## 2022-07-06 ENCOUNTER — Telehealth: Payer: Self-pay

## 2022-07-06 NOTE — Telephone Encounter (Signed)
     Patient  visit on [facility]  at 10/15  Have you been able to follow up with your primary care physician? yes  The patient was or was not able to obtain any needed medicine or equipment. yes  Are there diet recommendations that you are having difficulty following? na  Patient expresses understanding of discharge instructions and education provided has no other needs at this time. yes    [facility], Care Management  [phone], [place], [place] [address] Phone: [phone] Email: [email or web address]

## 2022-07-15 DIAGNOSIS — M542 Cervicalgia: Secondary | ICD-10-CM | POA: Diagnosis not present

## 2022-07-17 ENCOUNTER — Ambulatory Visit
Admission: RE | Admit: 2022-07-17 | Discharge: 2022-07-17 | Disposition: A | Discharge status: Home / Self Care | Payer: Medicare Other | Source: Ambulatory Visit | Attending: Internal Medicine | Admitting: Internal Medicine

## 2022-07-17 DIAGNOSIS — R519 Headache, unspecified: Secondary | ICD-10-CM

## 2022-07-19 ENCOUNTER — Telehealth: Payer: Self-pay | Admitting: Internal Medicine

## 2022-07-19 DIAGNOSIS — M542 Cervicalgia: Secondary | ICD-10-CM

## 2022-07-19 NOTE — Telephone Encounter (Signed)
Patient called and said that he had an MRI Sunday 07/17/22 and he said that they sent to Dr Jenny Reichmann and he would like a call with results at [phone]

## 2022-07-20 DIAGNOSIS — R0789 Other chest pain: Secondary | ICD-10-CM | POA: Diagnosis not present

## 2022-07-20 DIAGNOSIS — K5900 Constipation, unspecified: Secondary | ICD-10-CM | POA: Diagnosis not present

## 2022-07-20 DIAGNOSIS — G894 Chronic pain syndrome: Secondary | ICD-10-CM | POA: Diagnosis not present

## 2022-07-20 NOTE — Telephone Encounter (Signed)
Pt verbalized understanding of results however pt is requesting to see a specialist that can help him with his neck pain and assist in finding a cause.  Please refer pt to a specialist per pt request.

## 2022-07-20 NOTE — Telephone Encounter (Signed)
Patient is requesting referral for his neck pain

## 2022-07-20 NOTE — Telephone Encounter (Signed)
[facility] for ortho referral - done

## 2022-07-27 ENCOUNTER — Telehealth: Payer: Self-pay | Admitting: Internal Medicine

## 2022-07-27 DIAGNOSIS — M545 Low back pain, unspecified: Secondary | ICD-10-CM

## 2022-07-27 DIAGNOSIS — M5481 Occipital neuralgia: Secondary | ICD-10-CM

## 2022-07-27 DIAGNOSIS — R519 Headache, unspecified: Secondary | ICD-10-CM

## 2022-07-27 NOTE — Telephone Encounter (Signed)
Patient has decided to move forward with referral to neuro.

## 2022-07-27 NOTE — Telephone Encounter (Signed)
Patient would like a referral to a neurologist.  Please advise.  Patient said that he has discussed this with Dr. Jenny Reichmann previously.

## 2022-07-27 NOTE — Telephone Encounter (Signed)
Ok this is done 

## 2022-08-18 NOTE — Progress Notes (Signed)
Referring:  Biagio Borg, MD [address] [place],  [place] [address]  PCP: Biagio Borg, MD  Neurology was asked to evaluate Edward Chen., a 61 year old male for a chief complaint of headaches.  Our recommendations of care will be communicated by shared medical record.    CC:  headaches  History provided from self  HPI:  Medical co-morbidities: asthma, GERD, HTN, HLD, anxiety, cervical stenosis s/p fusion in 2006  The patient presents for evaluation of headaches which began in October 2023 after an period of heavy lifting. Headaches start in the right occiput and radiate forward. Went to the ED 06/26/22 where he was given an occipital nerve block. This did relieve his pain completely for about 24 hours. Pain then returned but was less severe. He was given a prednisone pack from his orthopedic doctor which did help. His PCP started him on gabapentin 100 mg TID for occipital neuralgia, but he stopped it after 2 doses as he did not find it helpful. Also takes Flexeril for chronic back pain, but this also did not help his headaches. He was also referred to orthopedics for his neck pain. He went to a few PT sessions but stopped as it was too expensive.  He denies radicular pain or numbness/weakness in his upper extremities.  Brain MRI 07/17/22 showed a 16 mm hyperintensity suspected to be a prominent perivascular space. It also showed scattered nonspecific white matter changes. MRI was otherwise unremarkable.  Headache History: Onset: 2 months ago Triggers: lying on the back of his head Aura: none Location: right occiput and retro-orbital Quality/Description: throbbing Associated Symptoms:  Photophobia: no  Phonophobia: no  Nausea: no Worse with activity?: no Duration of headaches: several hours  Headache days per month: 30 Headache free days per month: 0  Current Treatment: Abortive none  Preventative none  Prior Therapies                                  Gabapentin 100 mg TID   LABS: CBC    Component Value Date/Time   WBC 6.0 09/16/2021 1127   RBC 4.42 09/16/2021 1127   HGB 13.9 09/16/2021 1127   HCT 40.0 09/16/2021 1127   PLT 152.0 09/16/2021 1127   MCV 90.6 09/16/2021 1127   MCHC 34.8 09/16/2021 1127   RDW 12.6 09/16/2021 1127   LYMPHSABS 1.8 09/16/2021 1127   MONOABS 0.3 09/16/2021 1127   EOSABS 0.1 09/16/2021 1127   BASOSABS 0.0 09/16/2021 1127      Latest Ref Rng & Units 04/04/2022    8:51 AM 09/16/2021   11:27 AM 03/12/2021   10:02 AM  CMP  Glucose 70 - 99 mg/dL 112  115  113   BUN 6 - 23 mg/dL '17  16  17   '$ Creatinine 0.40 - 1.50 mg/dL 0.99  1.10  1.11   Sodium 135 - 145 mEq/L 138  139  141   Potassium 3.5 - 5.1 mEq/L 4.0  4.2  4.7   Chloride 96 - 112 mEq/L 103  104  105   CO2 19 - 32 mEq/L '28  30  29   '$ Calcium 8.4 - 10.5 mg/dL 9.0  9.2  9.5   Total Protein 6.0 - 8.3 g/dL 6.7  7.2  7.3   Total Bilirubin 0.2 - 1.2 mg/dL 0.6  1.3  0.8   Alkaline Phos 39 - 117 U/L 56  55  58   AST 0 - 37 U/L '15  15  17   '$ ALT 0 - 53 U/L '14  15  15      '$ IMAGING:  MRI brain 07/17/22: 16 mm T2 hyperintense focus within the inferior left parietal lobe white matter, favored to reflect a prominent perivascular space (although chronic lacunar infarct and neuroglial cyst are alternative considerations).   There are a few small scattered foci of T2 FLAIR hyperintense signal abnormality elsewhere within the cerebral white matter, nonspecific but most often secondary to chronic small vessel ischemia.  Imaging independently reviewed on August 19, 2022   Current Outpatient Medications on File Prior to Visit  Medication Sig Dispense Refill   albuterol (PROVENTIL HFA;VENTOLIN HFA) 108 (90 Base) MCG/ACT inhaler Inhale 2 puffs into the lungs every 6 (six) hours as needed for wheezing or shortness of breath. 1 Inhaler 11   ALPRAZolam (XANAX) 0.5 MG tablet Take 1 tablet (0.5 mg total) by mouth daily as needed. 90 tablet 1   aspirin EC 81 MG  tablet Take 81 mg by mouth daily.     atorvastatin (LIPITOR) 10 MG tablet TAKE 1 TABLET BY MOUTH EVERY DAY 90 tablet 2   baclofen (LIORESAL) 10 MG tablet Take 10 mg by mouth as needed for muscle spasms.     buPROPion (WELLBUTRIN) 100 MG tablet 1 tablet by mouth three times a day     clotrimazole-betamethasone (LOTRISONE) cream APPLY 1 APPLICATION TOPICALLY DAILY 30 g 0   cyclobenzaprine (FLEXERIL) 5 MG tablet Take 1 tablet (5 mg total) by mouth 3 (three) times daily as needed. 60 tablet 1   omeprazole (PRILOSEC) 20 MG capsule TAKE 1 CAPSULE BY MOUTH EVERY DAY 30 capsule 2   oxyCODONE-acetaminophen (PERCOCET) 7.5-325 MG per tablet Take as directed by pain management     sildenafil (VIAGRA) 100 MG tablet Take 1/2 to 1 tablet by mouth daily as needed for erectile dysfunction. 20 tablet 4   telmisartan (MICARDIS) 80 MG tablet TAKE 1 TABLET BY MOUTH EVERY DAY 90 tablet 2   No current facility-administered medications on file prior to visit.     Allergies: Allergies  Allergen Reactions   Other Anaphylaxis    BLACKBERRIES   Peanuts [Peanut Oil] Anaphylaxis   Shellfish Allergy Anaphylaxis   Betadine [Povidone Iodine] Other (See Comments)    Due to shellfish allergy    Lexapro [Escitalopram Oxalate] Other (See Comments)   Nexium [Esomeprazole Magnesium] Other (See Comments)    Chest pain, does ok with prilosec    Family History: Family History  Problem Relation Age of Onset   Lung disease Father        smoker   Cancer Neg Hx    CAD Neg Hx    Stroke Neg Hx    Diabetes Neg Hx    Colon cancer Neg Hx    Esophageal cancer Neg Hx    Stomach cancer Neg Hx    Rectal cancer Neg Hx      Past Medical History: Past Medical History:  Diagnosis Date   Abnormal drug screen 09/2014   neg xanax (but PRN use), pos EtOH   Acute asthmatic bronchitis    Allergic rhinitis    Allergy    Anxiety    with h/o attacks "on xanax for chest pain"   Asthma 11/16/2015   Benign hypertension     Borderline diabetes mellitus    Cervical disc disease 11/16/2015   Chronic pain syndrome    [facility] - percocets 7.'5mg'$    Depression    Depression with anxiety 10/29/2017   DJD (degenerative joint disease)    Erectile dysfunction 10/29/2017   Gastroparesis    GERD (gastroesophageal reflux disease)    Habitual alcohol use    Hyperlipidemia    Lumbar disc disease 11/16/2015   Palpitations     Past Surgical History Past Surgical History:  Procedure Laterality Date   APPENDECTOMY     CERVICAL DISCECTOMY  4/06   decompression, foraminotomy, and fusion Dr. Joya Salm    COLONOSCOPY  11/2012   1 TA, mod diverticulosis, rpt 5 yrs Deatra Ina)   EAR CYST EXCISION Right 04/30/2015   Procedure: CYST REMOVAL Right knee;  Surgeon: Ninetta Lights, MD;  Location: [facility];  Service: Orthopedics;  Laterality: Right;   ESOPHAGOGASTRODUODENOSCOPY  02/2014   gastric polyps, retained gastric residue s/p dilatation Deatra Ina)   KNEE ARTHROSCOPY WITH LATERAL MENISECTOMY Right 04/30/2015   Procedure: RIGHT KNEE ARTHROSCOPY;  Surgeon: Ninetta Lights, MD;  Location: [facility];  Service: Orthopedics;  Laterality: Right;   KNEE ARTHROSCOPY WITH MENISCAL REPAIR Right 04/30/2015   Procedure: KNEE ARTHROSCOPY WITH OPEN MENISCAL REPAIR;  Surgeon: Ninetta Lights, MD;  Location: [facility];  Service: Orthopedics;  Laterality: Right;   SHOULDER SURGERY Right 11/05   Arthroscopy, debridement, distal resection   SHOULDER SURGERY Left 05/2011   Dr. Percell Miller   UPPER GASTROINTESTINAL ENDOSCOPY     X3    Social History: Social History   Tobacco Use   Smoking status: Former    Years: 20.00    Types: Cigarettes    Quit date: 10/20/2020    Years since quitting: 1.8   Smokeless tobacco: Never   Tobacco comments:    smokes a couple of times a week  Vaping Use   Vaping Use: Never used  Substance Use Topics   Alcohol use: Yes    Alcohol/week: 12.0 standard drinks of  alcohol    Types: 12 Cans of beer per week    Comment: DRINKS ON WEEKENDS   Drug use: No    ROS: Negative for fevers, chills. Positive for headaches. All other systems reviewed and negative unless stated otherwise in HPI.   Physical Exam:   Vital Signs: BP 132/85   Pulse 77   Ht '5\' 7"'$  (1.702 m)   Wt 149 lb (67.6 kg)   BMI 23.34 kg/m  GENERAL: well appearing,in no acute distress,alert SKIN:  Color, texture, turgor normal. No rashes or lesions HEAD:  Normocephalic/atraumatic. CV:  RRR RESP: Normal respiratory effort MSK: +tenderness to palpation over R>L occiput, neck, and shoulders. Limited range of motion turning head left and right  NEUROLOGICAL: Mental Status: Alert, oriented to person, place and time,Follows commands Cranial Nerves: PERRL, visual fields intact to confrontation, extraocular movements intact, facial sensation intact, no facial droop or ptosis, hearing grossly intact, no dysarthria Motor: muscle strength 5/5 both upper and lower extremities,no drift, normal tone Reflexes: 2+ throughout Sensation: intact to light touch all 4 extremities Coordination: Finger-to- nose-finger intact bilaterally Gait: normal-based   IMPRESSION: 61 year old male with a history of asthma, GERD, HTN, HLD, anxiety, cervical stenosis s/p fusion in 2006 who presents for evaluation of headaches. His clinical picture is most consistent with R>L occipital neuralgia. He does not report radicular symptoms at this time. Discussed diagnosis and treatment options. Recommend he continue PT exercises at home. Will increase gabapentin to 300 mg TID, and counseled that this will need  to be taken consistently to be effective.   PLAN: -Continue home PT exercises -Increase gabapentin 300 mg TID, counseled on importance of taking this consistently  -Next steps: consider occipital nerve block, low dose amitripytline  I spent a total of 31 minutes chart reviewing and counseling the patient. Headache  education was done. Discussed treatment options including preventive and acute medications, and physical therapy. Discussed medication side effects, adverse reactions and drug interactions. Written educational materials and patient instructions outlining all of the above were given.  Follow-up: 6 months   Genia Harold, MD 08/19/2022   9:59 AM

## 2022-08-19 ENCOUNTER — Encounter: Payer: Self-pay | Admitting: Psychiatry

## 2022-08-19 ENCOUNTER — Ambulatory Visit: Payer: Medicare Other | Admitting: Psychiatry

## 2022-08-19 VITALS — BP 132/85 | HR 77 | Ht 67.0 in | Wt 149.0 lb

## 2022-08-19 DIAGNOSIS — M5481 Occipital neuralgia: Secondary | ICD-10-CM

## 2022-08-19 MED ORDER — GABAPENTIN 300 MG PO CAPS: 300.0000 mg | ORAL_CAPSULE | Freq: Three times a day (TID) | ORAL | 90 refills | Status: DC

## 2022-08-19 NOTE — Patient Instructions (Signed)
Increase gabapentin to 300 mg three times a day  Occipital neuralgia Your headaches are consistent with occipital neuralgia.This is caused by irritation of the occipital nerve, which travels between your neck muscles and provides sensation to your scalp. Pain is often described as a sharp, shooting pain at the back of the head which travels to the scalp and behind the eye. Other symptoms include tenderness at the base of the head and neck pain. When neck muscles are tight they can compress the nerve and cause irritation.   Treatment options include physical therapy for neck pain and tightness. We can also use medications for neuropathic pain (pain originating from a nerve) as well as muscle relaxers. We can also perform an occipital nerve block, which is an injection of steroids and a numbing medication in the back of the head. This can be performed every 3 months.

## 2022-09-19 DIAGNOSIS — K5900 Constipation, unspecified: Secondary | ICD-10-CM | POA: Diagnosis not present

## 2022-09-19 DIAGNOSIS — R0789 Other chest pain: Secondary | ICD-10-CM | POA: Diagnosis not present

## 2022-09-19 DIAGNOSIS — G894 Chronic pain syndrome: Secondary | ICD-10-CM | POA: Diagnosis not present

## 2022-10-05 ENCOUNTER — Telehealth: Payer: Self-pay | Admitting: Internal Medicine

## 2022-10-05 ENCOUNTER — Ambulatory Visit (INDEPENDENT_AMBULATORY_CARE_PROVIDER_SITE_OTHER): Payer: Medicare Other | Admitting: Internal Medicine

## 2022-10-05 ENCOUNTER — Encounter: Payer: Self-pay | Admitting: Internal Medicine

## 2022-10-05 ENCOUNTER — Other Ambulatory Visit: Payer: Self-pay

## 2022-10-05 VITALS — BP 124/66 | HR 80 | Temp 98.1°F | Ht 67.0 in | Wt 151.0 lb

## 2022-10-05 DIAGNOSIS — E559 Vitamin D deficiency, unspecified: Secondary | ICD-10-CM

## 2022-10-05 DIAGNOSIS — R972 Elevated prostate specific antigen [PSA]: Secondary | ICD-10-CM

## 2022-10-05 DIAGNOSIS — E538 Deficiency of other specified B group vitamins: Secondary | ICD-10-CM | POA: Diagnosis not present

## 2022-10-05 DIAGNOSIS — Z0001 Encounter for general adult medical examination with abnormal findings: Secondary | ICD-10-CM

## 2022-10-05 DIAGNOSIS — I10 Essential (primary) hypertension: Secondary | ICD-10-CM | POA: Diagnosis not present

## 2022-10-05 DIAGNOSIS — E785 Hyperlipidemia, unspecified: Secondary | ICD-10-CM | POA: Diagnosis not present

## 2022-10-05 DIAGNOSIS — R739 Hyperglycemia, unspecified: Secondary | ICD-10-CM | POA: Diagnosis not present

## 2022-10-05 DIAGNOSIS — R059 Cough, unspecified: Secondary | ICD-10-CM | POA: Insufficient documentation

## 2022-10-05 DIAGNOSIS — R051 Acute cough: Secondary | ICD-10-CM

## 2022-10-05 LAB — VITAMIN B12: Vitamin B-12: 795 pg/mL (ref 211–911)

## 2022-10-05 LAB — CBC WITH DIFFERENTIAL/PLATELET
Basophils Absolute: 0 10*3/uL (ref 0.0–0.1)
Basophils Relative: 0.6 % (ref 0.0–3.0)
Eosinophils Absolute: 0.2 10*3/uL (ref 0.0–0.7)
Eosinophils Relative: 2.9 % (ref 0.0–5.0)
HCT: 40.3 % (ref 39.0–52.0)
Hemoglobin: 14.3 g/dL (ref 13.0–17.0)
Lymphocytes Relative: 40.7 % (ref 12.0–46.0)
Lymphs Abs: 2.1 10*3/uL (ref 0.7–4.0)
MCHC: 35.6 g/dL (ref 30.0–36.0)
MCV: 90.7 fl (ref 78.0–100.0)
Monocytes Absolute: 0.3 10*3/uL (ref 0.1–1.0)
Monocytes Relative: 5.8 % (ref 3.0–12.0)
Neutro Abs: 2.6 10*3/uL (ref 1.4–7.7)
Neutrophils Relative %: 50 % (ref 43.0–77.0)
Platelets: 171 10*3/uL (ref 150.0–400.0)
RBC: 4.44 Mil/uL (ref 4.22–5.81)
RDW: 12.4 % (ref 11.5–15.5)
WBC: 5.2 10*3/uL (ref 4.0–10.5)

## 2022-10-05 LAB — MICROALBUMIN / CREATININE URINE RATIO
Creatinine,U: 105.3 mg/dL
Microalb Creat Ratio: 0.7 mg/g (ref 0.0–30.0)
Microalb, Ur: <0.7 mg/dL (ref 0.0–1.9)

## 2022-10-05 LAB — URINALYSIS, ROUTINE W REFLEX MICROSCOPIC
Bilirubin Urine: NEGATIVE
Hgb urine dipstick: NEGATIVE
Ketones, ur: NEGATIVE
Leukocytes,Ua: NEGATIVE
Nitrite: NEGATIVE
RBC / HPF: NONE SEEN (ref 0–?)
Specific Gravity, Urine: 1.02 (ref 1.000–1.030)
Total Protein, Urine: NEGATIVE
Urine Glucose: NEGATIVE
Urobilinogen, UA: 0.2 (ref 0.0–1.0)
WBC, UA: NONE SEEN (ref 0–?)
pH: 7.5 (ref 5.0–8.0)

## 2022-10-05 LAB — LDL CHOLESTEROL, DIRECT: Direct LDL: 75 mg/dL

## 2022-10-05 LAB — VITAMIN D 25 HYDROXY (VIT D DEFICIENCY, FRACTURES): VITD: 24.41 ng/mL — ABNORMAL LOW (ref 30.00–100.00)

## 2022-10-05 LAB — BASIC METABOLIC PANEL
BUN: 18 mg/dL (ref 6–23)
CO2: 30 mEq/L (ref 19–32)
Calcium: 9.2 mg/dL (ref 8.4–10.5)
Chloride: 101 mEq/L (ref 96–112)
Creatinine, Ser: 0.98 mg/dL (ref 0.40–1.50)
GFR: 81.95 mL/min (ref >60.00)
Glucose, Bld: 97 mg/dL (ref 70–99)
Potassium: 5 mEq/L (ref 3.5–5.1)
Sodium: 139 mEq/L (ref 135–145)

## 2022-10-05 LAB — HEPATIC FUNCTION PANEL
ALT: 16 U/L (ref 0–53)
AST: 16 U/L (ref 0–37)
Albumin: 4.6 g/dL (ref 3.5–5.2)
Alkaline Phosphatase: 58 U/L (ref 39–117)
Bilirubin, Direct: 0.1 mg/dL (ref 0.0–0.3)
Total Bilirubin: 0.6 mg/dL (ref 0.2–1.2)
Total Protein: 7.2 g/dL (ref 6.0–8.3)

## 2022-10-05 LAB — PSA: PSA: 0.72 ng/mL (ref 0.10–4.00)

## 2022-10-05 LAB — HEMOGLOBIN A1C: Hgb A1c MFr Bld: 4.9 % (ref 4.6–6.5)

## 2022-10-05 LAB — TSH: TSH: 1.44 u[IU]/mL (ref 0.35–5.50)

## 2022-10-05 LAB — LIPID PANEL
Cholesterol: 170 mg/dL (ref 0–200)
HDL: 37.7 mg/dL — ABNORMAL LOW (ref >39.00)
Total CHOL/HDL Ratio: 5
Triglycerides: 443 mg/dL — ABNORMAL HIGH (ref 0.0–149.0)

## 2022-10-05 MED ORDER — AZITHROMYCIN 250 MG PO TABS: ORAL_TABLET | ORAL | 1 refills | Status: AC

## 2022-10-05 MED ORDER — ALBUTEROL SULFATE HFA 108 (90 BASE) MCG/ACT IN AERS: 2.0000 | INHALATION_SPRAY | Freq: Four times a day (QID) | RESPIRATORY_TRACT | 11 refills | Status: DC | PRN

## 2022-10-05 NOTE — Assessment & Plan Note (Signed)
Lab Results  Component Value Date   LDLCALC 91 04/04/2022   Stable, pt to continue current statin lipitor 10 mg qd

## 2022-10-05 NOTE — Assessment & Plan Note (Signed)
BP Readings from Last 3 Encounters:  10/05/22 124/66  08/19/22 132/85  07/04/22 126/74   Stable, pt to continue medical treatment micardis 80 mg qd

## 2022-10-05 NOTE — Assessment & Plan Note (Signed)
Age and sex appropriate education and counseling updated with regular exercise and diet Referrals for preventative services - none needed Immunizations addressed - for flu, covid booster, shingrx, tdap at the pharmacy Smoking counseling  - none needed Evidence for depression or other mood disorder - none significant Most recent labs reviewed. I have personally reviewed and have noted: 1) the patient's medical and social history 2) The patient's current medications and supplements 3) The patient's height, weight, and BMI have been recorded in the chart

## 2022-10-05 NOTE — Patient Instructions (Addendum)
Please have your Shingrix (shingles) shots done at your local pharmacy, and the Tdap tetanus shot  Please take all new medication as prescribed - the antibiotic as discussed  Please continue all other medications as before, and refills have been done if requested.  Please have the pharmacy call with any other refills you may need.  Please continue your efforts at being more active, low cholesterol diet, and weight control.  You are otherwise up to date with prevention measures today.  Please keep your appointments with your specialists as you may have planned  Please go to the LAB at the blood drawing area for the tests to be done  You will be contacted by phone if any changes need to be made immediately.  Otherwise, you will receive a letter about your results with an explanation, but please check with MyChart first.  Please remember to sign up for MyChart if you have not done so, as this will be important to you in the future with finding out test results, communicating by private email, and scheduling acute appointments online when needed.  Please make an Appointment to return in 6 months, or sooner if needed

## 2022-10-05 NOTE — Telephone Encounter (Signed)
Patient states he forgot to mention during his appointment he needs a refill on his albuterol.

## 2022-10-05 NOTE — Assessment & Plan Note (Signed)
Mild to mod, c/w bronchitis vs pna, declines cxr, for antibx course, to f/u any worsening symptoms or concerns  

## 2022-10-05 NOTE — Assessment & Plan Note (Signed)
Lab Results  Component Value Date   OBTVMTNZ18 209 09/16/2021   Low, to continue oral replacement - b12 1000 mcg qd

## 2022-10-05 NOTE — Assessment & Plan Note (Signed)
Last vitamin D Lab Results  Component Value Date   VD25OH 34.83 09/16/2021   Low, to start oral replacement

## 2022-10-05 NOTE — Assessment & Plan Note (Signed)
Lab Results  Component Value Date   HGBA1C 5.3 04/04/2022   Stable, pt to continue current medical treatment  - diet, wt controlled

## 2022-10-05 NOTE — Progress Notes (Signed)
Patient ID: [name], male   DOB: 11-17-1960, 62 y.o.   MRN: [id number]         Chief Complaint:: wellness exam and cough, low b12, hld, hyperglycemia, htn, low vit d       HPI:  Edward Chen. is a 62 y.o. male here for wellness exam; for tdap, flu shot, and shingrx at the pharmacy, o/w up to date                        Also Here with acute onset mild to mod 2-3 days ST, HA, general weakness and malaise, with prod cough greenish sputum, but Pt denies chest pain, increased sob or doe, wheezing, orthopnea, PND, increased LE swelling, palpitations, dizziness or syncope.   Pt denies polydipsia, polyuria, or new focal neuro s/s.    Pt denies fever, wt loss, night sweats, loss of appetite, or other constitutional symptoms  Taking B12 daily for last 6 mo.     Wt Readings from Last 3 Encounters:  10/05/22 151 lb (68.5 kg)  08/19/22 149 lb (67.6 kg)  07/04/22 146 lb (66.2 kg)   BP Readings from Last 3 Encounters:  10/05/22 124/66  08/19/22 132/85  07/04/22 126/74   Immunization History  Administered Date(s) Administered   Influenza Whole 06/12/2004   PFIZER(Purple Top)SARS-COV-2 Vaccination 12/30/2019, 01/20/2020   Pneumococcal Polysaccharide-23 10/12/2009   Td 09/13/2011   Health Maintenance Due  Topic Date Due   DTaP/Tdap/Td (2 - Tdap) 09/12/2021      Past Medical History:  Diagnosis Date   Abnormal drug screen 09/2014   neg xanax (but PRN use), pos EtOH   Acute asthmatic bronchitis    Allergic rhinitis    Allergy    Anxiety    with h/o attacks "on xanax for chest pain"   Asthma 11/16/2015   Benign hypertension    Borderline diabetes mellitus    Cervical disc disease 11/16/2015   Chronic pain syndrome    [facility] - percocets 7.'5mg'$    Depression    Depression with anxiety 10/29/2017   DJD (degenerative joint disease)    Erectile dysfunction 10/29/2017   Gastroparesis    GERD (gastroesophageal reflux disease)    Habitual alcohol use    Hyperlipidemia     Lumbar disc disease 11/16/2015   Palpitations    Past Surgical History:  Procedure Laterality Date   APPENDECTOMY     CERVICAL DISCECTOMY  4/06   decompression, foraminotomy, and fusion Dr. Joya Salm    COLONOSCOPY  11/2012   1 TA, mod diverticulosis, rpt 5 yrs Deatra Ina)   EAR CYST EXCISION Right 04/30/2015   Procedure: CYST REMOVAL Right knee;  Surgeon: Ninetta Lights, MD;  Location: [place];  Service: Orthopedics;  Laterality: Right;   ESOPHAGOGASTRODUODENOSCOPY  02/2014   gastric polyps, retained gastric residue s/p dilatation Deatra Ina)   KNEE ARTHROSCOPY WITH LATERAL MENISECTOMY Right 04/30/2015   Procedure: RIGHT KNEE ARTHROSCOPY;  Surgeon: Ninetta Lights, MD;  Location: [place];  Service: Orthopedics;  Laterality: Right;   KNEE ARTHROSCOPY WITH MENISCAL REPAIR Right 04/30/2015   Procedure: KNEE ARTHROSCOPY WITH OPEN MENISCAL REPAIR;  Surgeon: Ninetta Lights, MD;  Location: [place] [facility];  Service: Orthopedics;  Laterality: Right;   SHOULDER SURGERY Right 11/05   Arthroscopy, debridement, distal resection   SHOULDER SURGERY Left 05/2011   Dr. Percell Miller   UPPER GASTROINTESTINAL ENDOSCOPY     X3  reports that he quit smoking about 1 years ago. His smoking use included cigarettes. He has never used smokeless tobacco. He reports current alcohol use of about 12.0 standard drinks of alcohol per week. He reports that he does not use drugs. family history includes Lung disease in his father. Allergies  Allergen Reactions   Other Anaphylaxis    BLACKBERRIES   Peanuts [Peanut Oil] Anaphylaxis   Shellfish Allergy Anaphylaxis   Betadine [Povidone Iodine] Other (See Comments)    Due to shellfish allergy    Lexapro [Escitalopram Oxalate] Other (See Comments)   Nexium [Esomeprazole Magnesium] Other (See Comments)    Chest pain, does ok with prilosec   Current Outpatient Medications on File Prior to Visit  Medication Sig Dispense Refill   albuterol  (PROVENTIL HFA;VENTOLIN HFA) 108 (90 Base) MCG/ACT inhaler Inhale 2 puffs into the lungs every 6 (six) hours as needed for wheezing or shortness of breath. 1 Inhaler 11   ALPRAZolam (XANAX) 0.5 MG tablet Take 1 tablet (0.5 mg total) by mouth daily as needed. 90 tablet 1   aspirin EC 81 MG tablet Take 81 mg by mouth daily.     atorvastatin (LIPITOR) 10 MG tablet TAKE 1 TABLET BY MOUTH EVERY DAY 90 tablet 2   baclofen (LIORESAL) 10 MG tablet Take 10 mg by mouth as needed for muscle spasms.     buPROPion (WELLBUTRIN) 100 MG tablet 1 tablet by mouth three times a day     clotrimazole-betamethasone (LOTRISONE) cream APPLY 1 APPLICATION TOPICALLY DAILY 30 g 0   cyclobenzaprine (FLEXERIL) 5 MG tablet Take 1 tablet (5 mg total) by mouth 3 (three) times daily as needed. 60 tablet 1   gabapentin (NEURONTIN) 300 MG capsule Take 1 capsule (300 mg total) by mouth 3 (three) times daily. 90 capsule 90   omeprazole (PRILOSEC) 20 MG capsule TAKE 1 CAPSULE BY MOUTH EVERY DAY 30 capsule 2   oxyCODONE-acetaminophen (PERCOCET) 7.5-325 MG per tablet Take as directed by pain management     sildenafil (VIAGRA) 100 MG tablet Take 1/2 to 1 tablet by mouth daily as needed for erectile dysfunction. 20 tablet 4   telmisartan (MICARDIS) 80 MG tablet TAKE 1 TABLET BY MOUTH EVERY DAY 90 tablet 2   No current facility-administered medications on file prior to visit.        ROS:  All others reviewed and negative.  Objective        PE:  BP 124/66 (BP Location: Left Arm, Patient Position: Sitting, Cuff Size: Large)   Pulse 80   Temp 98.1 F (36.7 C) (Oral)   Ht '5\' 7"'$  (1.702 m)   Wt 151 lb (68.5 kg)   SpO2 98%   BMI 23.65 kg/m                 Constitutional: Pt appears in NAD               HENT: Head: NCAT.                Right Ear: External ear normal.                 Left Ear: External ear normal.                Eyes: . Pupils are equal, round, and reactive to light. Conjunctivae and EOM are normal                Nose: without d/c or deformity  Neck: Neck supple. Gross normal ROM               Cardiovascular: Normal rate and regular rhythm.                 Pulmonary/Chest: Effort normal and breath sounds without rales or wheezing.                Abd:  Soft, NT, ND, + BS, no organomegaly               Neurological: Pt is alert. At baseline orientation, motor grossly intact               Skin: Skin is warm. No rashes, no other new lesions, LE edema - none               Psychiatric: Pt behavior is normal without agitation   Micro: none  Cardiac tracings I have personally interpreted today:  none  Pertinent Radiological findings (summarize): none   Lab Results  Component Value Date   WBC 6.0 09/16/2021   HGB 13.9 09/16/2021   HCT 40.0 09/16/2021   PLT 152.0 09/16/2021   GLUCOSE 112 (H) 04/04/2022   CHOL 159 04/04/2022   TRIG 79.0 04/04/2022   HDL 52.20 04/04/2022   LDLDIRECT 86.0 09/16/2021   LDLCALC 91 04/04/2022   ALT 14 04/04/2022   AST 15 04/04/2022   NA 138 04/04/2022   K 4.0 04/04/2022   CL 103 04/04/2022   CREATININE 0.99 04/04/2022   BUN 17 04/04/2022   CO2 28 04/04/2022   TSH 1.44 09/16/2021   PSA 0.85 09/16/2021   HGBA1C 5.3 04/04/2022   Assessment/Plan:  Edward Chen. is a 62 y.o. White or Caucasian [1] male with  has a past medical history of Abnormal drug screen (09/2014), Acute asthmatic bronchitis, Allergic rhinitis, Allergy, Anxiety, Asthma (11/16/2015), Benign hypertension, Borderline diabetes mellitus, Cervical disc disease (11/16/2015), Chronic pain syndrome, Depression, Depression with anxiety (10/29/2017), DJD (degenerative joint disease), Erectile dysfunction (10/29/2017), Gastroparesis, GERD (gastroesophageal reflux disease), Habitual alcohol use, Hyperlipidemia, Lumbar disc disease (11/16/2015), and Palpitations.  Encounter for well adult exam with abnormal findings Age and sex appropriate education and counseling updated with regular exercise and  diet Referrals for preventative services - none needed Immunizations addressed - for flu, covid booster, shingrx, tdap at the pharmacy Smoking counseling  - none needed Evidence for depression or other mood disorder - none significant Most recent labs reviewed. I have personally reviewed and have noted: 1) the patient's medical and social history 2) The patient's current medications and supplements 3) The patient's height, weight, and BMI have been recorded in the chart   B12 deficiency Lab Results  Component Value Date   VITAMINB12 294 09/16/2021   Low, to continue oral replacement - b12 1000 mcg qd   Cough Mild to mod, c/w bronchitis vs pna, declines cxr, for antibx course,  to f/u any worsening symptoms or concerns   Dyslipidemia Lab Results  Component Value Date   LDLCALC 91 04/04/2022   Stable, pt to continue current statin lipitor 10 mg qd   Hyperglycemia Lab Results  Component Value Date   HGBA1C 5.3 04/04/2022   Stable, pt to continue current medical treatment  - diet, wt controlled   HYPERTENSION, BENIGN BP Readings from Last 3 Encounters:  10/05/22 124/66  08/19/22 132/85  07/04/22 126/74   Stable, pt to continue medical treatment micardis 80 mg qd   Vitamin D deficiency Last vitamin D Lab  Results  Component Value Date   VD25OH 34.83 09/16/2021   Low, to start oral replacement   Elevated PSA Lab Results  Component Value Date   PSA 0.85 09/16/2021   PSA 0.63 09/16/2020   PSA 0.63 03/10/2020   Recently stable, for f/u lab today  Followup: Return in about 6 months (around 04/05/2023).  Cathlean Cower, MD 10/05/2022 9:58 AM [facility]

## 2022-10-05 NOTE — Assessment & Plan Note (Signed)
Lab Results  Component Value Date   PSA 0.85 09/16/2021   PSA 0.63 09/16/2020   PSA 0.63 03/10/2020   Recently stable, for f/u lab today

## 2022-10-05 NOTE — Telephone Encounter (Signed)
Confirmed pharmacy with patient, Rx request sent to pharmacy

## 2022-10-07 ENCOUNTER — Telehealth: Payer: Self-pay

## 2022-10-07 NOTE — Telephone Encounter (Signed)
N/A unable to leave a message for patient to call back to schedule Medicare Annual Wellness Visit   Last AWV  09/30/21  Please schedule at anytime with [facility] if patient calls the office back.    30 Minutes appointment   Any questions, please call me at [phone]

## 2022-11-02 ENCOUNTER — Telehealth: Payer: Self-pay

## 2022-11-02 NOTE — Telephone Encounter (Signed)
Pt called asking for copy of last labs be sent to him via mail. He states he never received a copy.

## 2022-11-03 ENCOUNTER — Encounter: Payer: Self-pay | Admitting: Internal Medicine

## 2022-11-03 NOTE — Telephone Encounter (Signed)
I dont know how to make a letter after it has left my Lab list in my inbox.  Ok to print off his labs though and send

## 2022-11-03 NOTE — Telephone Encounter (Signed)
Labs printed and mailed to pt.  °

## 2022-11-03 NOTE — Telephone Encounter (Signed)
Informed patient of recent lab results, please abstract letter for results and I will send out in the mail per patient request

## 2022-11-21 DIAGNOSIS — G894 Chronic pain syndrome: Secondary | ICD-10-CM | POA: Diagnosis not present

## 2022-11-21 DIAGNOSIS — R0789 Other chest pain: Secondary | ICD-10-CM | POA: Diagnosis not present

## 2022-11-21 DIAGNOSIS — K5900 Constipation, unspecified: Secondary | ICD-10-CM | POA: Diagnosis not present

## 2022-12-22 ENCOUNTER — Other Ambulatory Visit: Payer: Self-pay | Admitting: Internal Medicine

## 2023-01-16 DIAGNOSIS — R0789 Other chest pain: Secondary | ICD-10-CM | POA: Diagnosis not present

## 2023-01-16 DIAGNOSIS — K5900 Constipation, unspecified: Secondary | ICD-10-CM | POA: Diagnosis not present

## 2023-01-16 DIAGNOSIS — G894 Chronic pain syndrome: Secondary | ICD-10-CM | POA: Diagnosis not present

## 2023-01-16 DIAGNOSIS — Z79891 Long term (current) use of opiate analgesic: Secondary | ICD-10-CM | POA: Diagnosis not present

## 2023-01-17 DIAGNOSIS — G894 Chronic pain syndrome: Secondary | ICD-10-CM | POA: Diagnosis not present

## 2023-01-17 DIAGNOSIS — Z79891 Long term (current) use of opiate analgesic: Secondary | ICD-10-CM | POA: Diagnosis not present

## 2023-02-23 NOTE — Progress Notes (Deleted)
No chief complaint on file.   HISTORY OF PRESENT ILLNESS:  02/23/23 ALL:  Edward Chen. is a 62 y.o. male here today for follow up for occipital neuralgia. He was seen in consult with Dr Delena Bali 08/2022 and restarted on gabapentin up to 300mg  TID. Since,    HISTORY (copied from Dr Quentin Mulling previous note)  Medical co-morbidities: asthma, GERD, HTN, HLD, anxiety, cervical stenosis s/p fusion in 2006   The patient presents for evaluation of headaches which began in October 2023 after an period of heavy lifting. Headaches start in the right occiput and radiate forward. Went to the ED 06/26/22 where he was given an occipital nerve block. This did relieve his pain completely for about 24 hours. Pain then returned but was less severe. He was given a prednisone pack from his orthopedic doctor which did help. His PCP started him on gabapentin 100 mg TID for occipital neuralgia, but he stopped it after 2 doses as he did not find it helpful. Also takes Flexeril for chronic back pain, but this also did not help his headaches. He was also referred to orthopedics for his neck pain. He went to a few PT sessions but stopped as it was too expensive.   He denies radicular pain or numbness/weakness in his upper extremities.   Brain MRI 07/17/22 showed a 16 mm hyperintensity suspected to be a prominent perivascular space. It also showed scattered nonspecific white matter changes. MRI was otherwise unremarkable.   Headache History: Onset: 2 months ago Triggers: lying on the back of his head Aura: none Location: right occiput and retro-orbital Quality/Description: throbbing Associated Symptoms:             Photophobia: no             Phonophobia: no             Nausea: no Worse with activity?: no Duration of headaches: several hours   Headache days per month: 30 Headache free days per month: 0   Current Treatment: Abortive none   Preventative none   Prior Therapies                                  Gabapentin 100 mg TID   REVIEW OF SYSTEMS: Out of a complete 14 system review of symptoms, the patient complains only of the following symptoms, and all other reviewed systems are negative.   ALLERGIES: Allergies  Allergen Reactions   Other Anaphylaxis    BLACKBERRIES   Peanuts [Peanut Oil] Anaphylaxis   Shellfish Allergy Anaphylaxis   Betadine [Povidone Iodine] Other (See Comments)    Due to shellfish allergy    Lexapro [Escitalopram Oxalate] Other (See Comments)   Nexium [Esomeprazole Magnesium] Other (See Comments)    Chest pain, does ok with prilosec     HOME MEDICATIONS: Outpatient Medications Prior to Visit  Medication Sig Dispense Refill   albuterol (VENTOLIN HFA) 108 (90 Base) MCG/ACT inhaler Inhale 2 puffs into the lungs every 6 (six) hours as needed for wheezing or shortness of breath. 1 each 11   ALPRAZolam (XANAX) 0.5 MG tablet Take 1 tablet (0.5 mg total) by mouth daily as needed. 90 tablet 1   aspirin EC 81 MG tablet Take 81 mg by mouth daily.     atorvastatin (LIPITOR) 10 MG tablet TAKE 1 TABLET BY MOUTH EVERY DAY 90 tablet 2   baclofen (LIORESAL) 10 MG tablet Take  10 mg by mouth as needed for muscle spasms.     buPROPion (WELLBUTRIN) 100 MG tablet 1 tablet by mouth three times a day     clotrimazole-betamethasone (LOTRISONE) cream APPLY 1 APPLICATION TOPICALLY DAILY 30 g 0   cyclobenzaprine (FLEXERIL) 5 MG tablet Take 1 tablet (5 mg total) by mouth 3 (three) times daily as needed. 60 tablet 1   gabapentin (NEURONTIN) 300 MG capsule Take 1 capsule (300 mg total) by mouth 3 (three) times daily. 90 capsule 90   omeprazole (PRILOSEC) 20 MG capsule TAKE 1 CAPSULE BY MOUTH EVERY DAY 30 capsule 2   oxyCODONE-acetaminophen (PERCOCET) 7.5-325 MG per tablet Take as directed by pain management     sildenafil (VIAGRA) 100 MG tablet Take 1/2 to 1 tablet by mouth daily as needed for erectile dysfunction. 20 tablet 4   telmisartan (MICARDIS) 80 MG tablet TAKE 1 TABLET BY  MOUTH EVERY DAY 90 tablet 2   No facility-administered medications prior to visit.     PAST MEDICAL HISTORY: Past Medical History:  Diagnosis Date   Abnormal drug screen 09/2014   neg xanax (but PRN use), pos EtOH   Acute asthmatic bronchitis    Allergic rhinitis    Allergy    Anxiety    with h/o attacks "on xanax for chest pain"   Asthma 11/16/2015   Benign hypertension    Borderline diabetes mellitus    Cervical disc disease 11/16/2015   Chronic pain syndrome    [facility] - percocets 7.5mg    Depression    Depression with anxiety 10/29/2017   DJD (degenerative joint disease)    Erectile dysfunction 10/29/2017   Gastroparesis    GERD (gastroesophageal reflux disease)    Habitual alcohol use    Hyperlipidemia    Lumbar disc disease 11/16/2015   Palpitations      PAST SURGICAL HISTORY: Past Surgical History:  Procedure Laterality Date   APPENDECTOMY     CERVICAL DISCECTOMY  4/06   decompression, foraminotomy, and fusion Dr. Jeral Fruit    COLONOSCOPY  11/2012   1 TA, mod diverticulosis, rpt 5 yrs Arlyce Dice)   EAR CYST EXCISION Right 04/30/2015   Procedure: CYST REMOVAL Right knee;  Surgeon: Loreta Ave, MD;  Location: [facility];  Service: Orthopedics;  Laterality: Right;   ESOPHAGOGASTRODUODENOSCOPY  02/2014   gastric polyps, retained gastric residue s/p dilatation Arlyce Dice)   KNEE ARTHROSCOPY WITH LATERAL MENISECTOMY Right 04/30/2015   Procedure: RIGHT KNEE ARTHROSCOPY;  Surgeon: Loreta Ave, MD;  Location: [facility];  Service: Orthopedics;  Laterality: Right;   KNEE ARTHROSCOPY WITH MENISCAL REPAIR Right 04/30/2015   Procedure: KNEE ARTHROSCOPY WITH OPEN MENISCAL REPAIR;  Surgeon: Loreta Ave, MD;  Location: [facility];  Service: Orthopedics;  Laterality: Right;   SHOULDER SURGERY Right 11/05   Arthroscopy, debridement, distal resection   SHOULDER SURGERY Left 05/2011   Dr. Eulah Pont   UPPER GASTROINTESTINAL ENDOSCOPY      X3     FAMILY HISTORY: Family History  Problem Relation Age of Onset   Lung disease Father        smoker   Cancer Neg Hx    CAD Neg Hx    Stroke Neg Hx    Diabetes Neg Hx    Colon cancer Neg Hx    Esophageal cancer Neg Hx    Stomach cancer Neg Hx    Rectal cancer Neg Hx      SOCIAL HISTORY: Social History  Socioeconomic History   Marital status: Married    Spouse name: Dietrick Kozinski   Number of children: Not on file   Years of education: Not on file   Highest education level: Not on file  Occupational History   Occupation: retired on disability  Tobacco Use   Smoking status: Former    Years: 20    Types: Cigarettes    Quit date: 10/20/2020    Years since quitting: 2.3   Smokeless tobacco: Never   Tobacco comments:    smokes a couple of times a week  Vaping Use   Vaping Use: Never used  Substance and Sexual Activity   Alcohol use: Yes    Alcohol/week: 12.0 standard drinks of alcohol    Types: 12 Cans of beer per week    Comment: DRINKS ON WEEKENDS   Drug use: No   Sexual activity: Not on file    Comment: smokes when drinking  Other Topics Concern   Not on file  Social History Narrative   On disability for chronic lower back pain followed by pain clinic   Social Determinants of Health   Financial Resource Strain: Low Risk  (09/30/2021)   Overall Financial Resource Strain (CARDIA)    Difficulty of Paying Living Expenses: Not hard at all  Food Insecurity: No Food Insecurity (09/30/2021)   Hunger Vital Sign    Worried About Running Out of Food in the Last Year: Never true    Ran Out of Food in the Last Year: Never true  Transportation Needs: No Transportation Needs (09/30/2021)   PRAPARE - Administrator, Civil Service (Medical): No    Lack of Transportation (Non-Medical): No  Physical Activity: Inactive (09/30/2021)   Exercise Vital Sign    Days of Exercise per Week: 0 days    Minutes of Exercise per Session: 0 min  Stress: No Stress  Concern Present (09/30/2021)   [employer] of Occupational Health - Occupational Stress Questionnaire    Feeling of Stress : Not at all  Social Connections: Not on file  Intimate Partner Violence: Not At Risk (09/30/2021)   Humiliation, Afraid, Rape, and Kick questionnaire    Fear of Current or Ex-Partner: No    Emotionally Abused: No    Physically Abused: No    Sexually Abused: No     PHYSICAL EXAM  There were no vitals filed for this visit. There is no height or weight on file to calculate BMI.  Generalized: Well developed, in no acute distress  Cardiology: normal rate and rhythm, no murmur auscultated  Respiratory: clear to auscultation bilaterally    Neurological examination  Mentation: Alert oriented to time, place, history taking. Follows all commands speech and language fluent Cranial nerve II-XII: Pupils were equal round reactive to light. Extraocular movements were full, visual field were full on confrontational test. Facial sensation and strength were normal. Uvula tongue midline. Head turning and shoulder shrug  were normal and symmetric. Motor: The motor testing reveals 5 over 5 strength of all 4 extremities. Good symmetric motor tone is noted throughout.  Sensory: Sensory testing is intact to soft touch on all 4 extremities. No evidence of extinction is noted.  Coordination: Cerebellar testing reveals good finger-nose-finger and heel-to-shin bilaterally.  Gait and station: Gait is normal. Tandem gait is normal. Romberg is negative. No drift is seen.  Reflexes: Deep tendon reflexes are symmetric and normal bilaterally.    DIAGNOSTIC DATA (LABS, IMAGING, TESTING) - I reviewed patient records, labs, notes, testing  and imaging myself where available.  Lab Results  Component Value Date   WBC 5.2 10/05/2022   HGB 14.3 10/05/2022   HCT 40.3 10/05/2022   MCV 90.7 10/05/2022   PLT 171.0 10/05/2022      Component Value Date/Time   NA 139 10/05/2022 0931   K 5.0  10/05/2022 0931   CL 101 10/05/2022 0931   CO2 30 10/05/2022 0931   GLUCOSE 97 10/05/2022 0931   BUN 18 10/05/2022 0931   CREATININE 0.98 10/05/2022 0931   CALCIUM 9.2 10/05/2022 0931   PROT 7.2 10/05/2022 0931   ALBUMIN 4.6 10/05/2022 0931   AST 16 10/05/2022 0931   ALT 16 10/05/2022 0931   ALKPHOS 58 10/05/2022 0931   BILITOT 0.6 10/05/2022 0931   GFRNONAA >60 04/29/2015 1010   GFRAA >60 04/29/2015 1010   Lab Results  Component Value Date   CHOL 170 10/05/2022   HDL 37.70 (L) 10/05/2022   LDLCALC 91 04/04/2022   LDLDIRECT 75.0 10/05/2022   TRIG (H) 10/05/2022    443.0 Triglyceride is over 400; calculations on Lipids are invalid.   CHOLHDL 5 10/05/2022   Lab Results  Component Value Date   HGBA1C 4.9 10/05/2022   Lab Results  Component Value Date   VITAMINB12 795 10/05/2022   Lab Results  Component Value Date   TSH 1.44 10/05/2022        No data to display               No data to display           ASSESSMENT AND PLAN  62 y.o. year old male  has a past medical history of Abnormal drug screen (09/2014), Acute asthmatic bronchitis, Allergic rhinitis, Allergy, Anxiety, Asthma (11/16/2015), Benign hypertension, Borderline diabetes mellitus, Cervical disc disease (11/16/2015), Chronic pain syndrome, Depression, Depression with anxiety (10/29/2017), DJD (degenerative joint disease), Erectile dysfunction (10/29/2017), Gastroparesis, GERD (gastroesophageal reflux disease), Habitual alcohol use, Hyperlipidemia, Lumbar disc disease (11/16/2015), and Palpitations. here with    No diagnosis found.  [name]. ***.  Healthy lifestyle habits encouraged. *** will follow up with PCP as directed. *** will return to see me in ***, sooner if needed. *** verbalizes understanding and agreement with this plan.   No orders of the defined types were placed in this encounter.    No orders of the defined types were placed in this encounter.    Shawnie Dapper, MSN, FNP-C  02/23/2023, 3:46 PM  [facility] [address] [place], [place] [address] [phone]

## 2023-02-28 ENCOUNTER — Ambulatory Visit: Payer: Medicare Other | Admitting: Family Medicine

## 2023-02-28 DIAGNOSIS — M5481 Occipital neuralgia: Secondary | ICD-10-CM

## 2023-03-13 DIAGNOSIS — R0789 Other chest pain: Secondary | ICD-10-CM | POA: Diagnosis not present

## 2023-03-13 DIAGNOSIS — G894 Chronic pain syndrome: Secondary | ICD-10-CM | POA: Diagnosis not present

## 2023-03-13 DIAGNOSIS — K5900 Constipation, unspecified: Secondary | ICD-10-CM | POA: Diagnosis not present

## 2023-03-17 ENCOUNTER — Other Ambulatory Visit: Payer: Self-pay | Admitting: Internal Medicine

## 2023-03-17 ENCOUNTER — Other Ambulatory Visit: Payer: Self-pay

## 2023-04-05 ENCOUNTER — Encounter: Payer: Self-pay | Admitting: Internal Medicine

## 2023-04-05 ENCOUNTER — Ambulatory Visit (INDEPENDENT_AMBULATORY_CARE_PROVIDER_SITE_OTHER): Payer: Medicare Other | Admitting: Internal Medicine

## 2023-04-05 VITALS — BP 130/82 | HR 65 | Temp 98.0°F | Ht 67.0 in | Wt 145.0 lb

## 2023-04-05 DIAGNOSIS — E538 Deficiency of other specified B group vitamins: Secondary | ICD-10-CM | POA: Diagnosis not present

## 2023-04-05 DIAGNOSIS — Z20828 Contact with and (suspected) exposure to other viral communicable diseases: Secondary | ICD-10-CM

## 2023-04-05 DIAGNOSIS — R739 Hyperglycemia, unspecified: Secondary | ICD-10-CM | POA: Diagnosis not present

## 2023-04-05 DIAGNOSIS — E785 Hyperlipidemia, unspecified: Secondary | ICD-10-CM

## 2023-04-05 DIAGNOSIS — I10 Essential (primary) hypertension: Secondary | ICD-10-CM | POA: Diagnosis not present

## 2023-04-05 DIAGNOSIS — J309 Allergic rhinitis, unspecified: Secondary | ICD-10-CM | POA: Diagnosis not present

## 2023-04-05 DIAGNOSIS — E559 Vitamin D deficiency, unspecified: Secondary | ICD-10-CM | POA: Diagnosis not present

## 2023-04-05 LAB — VITAMIN D 25 HYDROXY (VIT D DEFICIENCY, FRACTURES): VITD: 48.65 ng/mL (ref 30.00–100.00)

## 2023-04-05 LAB — LIPID PANEL
Cholesterol: 130 mg/dL (ref 0–200)
HDL: 46.1 mg/dL (ref >39.00)
LDL Cholesterol: 58 mg/dL (ref 0–99)
NonHDL: 83.77
Total CHOL/HDL Ratio: 3
Triglycerides: 128 mg/dL (ref 0.0–149.0)
VLDL: 25.6 mg/dL (ref 0.0–40.0)

## 2023-04-05 LAB — BASIC METABOLIC PANEL
BUN: 18 mg/dL (ref 6–23)
CO2: 29 mEq/L (ref 19–32)
Calcium: 9.3 mg/dL (ref 8.4–10.5)
Chloride: 103 mEq/L (ref 96–112)
Creatinine, Ser: 1.1 mg/dL (ref 0.40–1.50)
GFR: 71.09 mL/min (ref >60.00)
Glucose, Bld: 99 mg/dL (ref 70–99)
Potassium: 4 mEq/L (ref 3.5–5.1)
Sodium: 139 mEq/L (ref 135–145)

## 2023-04-05 LAB — HEPATIC FUNCTION PANEL
ALT: 14 U/L (ref 0–53)
AST: 16 U/L (ref 0–37)
Albumin: 4.2 g/dL (ref 3.5–5.2)
Alkaline Phosphatase: 51 U/L (ref 39–117)
Bilirubin, Direct: 0.1 mg/dL (ref 0.0–0.3)
Total Bilirubin: 0.7 mg/dL (ref 0.2–1.2)
Total Protein: 6.7 g/dL (ref 6.0–8.3)

## 2023-04-05 LAB — HEMOGLOBIN A1C: Hgb A1c MFr Bld: 5.2 % (ref 4.6–6.5)

## 2023-04-05 LAB — VITAMIN B12: Vitamin B-12: 816 pg/mL (ref 211–911)

## 2023-04-05 NOTE — Assessment & Plan Note (Signed)
BP Readings from Last 3 Encounters:  04/05/23 130/82  10/05/22 124/66  08/19/22 132/85   Stable, pt to continue medical treatment micardis 80 qd

## 2023-04-05 NOTE — Progress Notes (Signed)
Patient ID: Edward Ace., male   DOB: Jun 19, 1961, 62 y.o.   MRN: [id number]        Chief Complaint: follow up HTN, HLD and hypertriglyceridemia, low vit d and b12       HPI:  Edward Parkerson. is a 62 y.o. male here overall doing ok, Pt denies chest pain, increased sob or doe, wheezing, orthopnea, PND, increased LE swelling, palpitations, dizziness or syncope.   Pt denies polydipsia, polyuria, or new focal neuro s/s.    Pt denies fever, wt loss, night sweats, loss of appetite, or other constitutional symptoms  Does have several wks ongoing nasal allergy symptoms with clearish congestion, itch and sneezing, without fever, pain, ST, cough, swelling or wheezing.       Wt Readings from Last 3 Encounters:  04/05/23 145 lb (65.8 kg)  10/05/22 151 lb (68.5 kg)  08/19/22 149 lb (67.6 kg)   BP Readings from Last 3 Encounters:  04/05/23 130/82  10/05/22 124/66  08/19/22 132/85         Past Medical History:  Diagnosis Date   Abnormal drug screen 09/2014   neg xanax (but PRN use), pos EtOH   Acute asthmatic bronchitis    Allergic rhinitis    Allergy    Anxiety    with h/o attacks "on xanax for chest pain"   Asthma 11/16/2015   Benign hypertension    Borderline diabetes mellitus    Cervical disc disease 11/16/2015   Chronic pain syndrome    [facility] - percocets 7.5mg    Depression    Depression with anxiety 10/29/2017   DJD (degenerative joint disease)    Erectile dysfunction 10/29/2017   Gastroparesis    GERD (gastroesophageal reflux disease)    Habitual alcohol use    Hyperlipidemia    Lumbar disc disease 11/16/2015   Palpitations    Past Surgical History:  Procedure Laterality Date   APPENDECTOMY     CERVICAL DISCECTOMY  4/06   decompression, foraminotomy, and fusion Dr. Jeral Fruit    COLONOSCOPY  11/2012   1 TA, mod diverticulosis, rpt 5 yrs Arlyce Dice)   EAR CYST EXCISION Right 04/30/2015   Procedure: CYST REMOVAL Right knee;  Surgeon: Loreta Ave, MD;  Location: [facility];  Service: Orthopedics;  Laterality: Right;   ESOPHAGOGASTRODUODENOSCOPY  02/2014   gastric polyps, retained gastric residue s/p dilatation Arlyce Dice)   KNEE ARTHROSCOPY WITH LATERAL MENISECTOMY Right 04/30/2015   Procedure: RIGHT KNEE ARTHROSCOPY;  Surgeon: Loreta Ave, MD;  Location: [facility];  Service: Orthopedics;  Laterality: Right;   KNEE ARTHROSCOPY WITH MENISCAL REPAIR Right 04/30/2015   Procedure: KNEE ARTHROSCOPY WITH OPEN MENISCAL REPAIR;  Surgeon: Loreta Ave, MD;  Location: [facility];  Service: Orthopedics;  Laterality: Right;   SHOULDER SURGERY Right 11/05   Arthroscopy, debridement, distal resection   SHOULDER SURGERY Left 05/2011   Dr. Eulah Pont   UPPER GASTROINTESTINAL ENDOSCOPY     X3    reports that he quit smoking about 2 years ago. His smoking use included cigarettes. He started smoking about 22 years ago. He has never used smokeless tobacco. He reports current alcohol use of about 12.0 standard drinks of alcohol per week. He reports that he does not use drugs. family history includes Lung disease in his father. Allergies  Allergen Reactions   Other Anaphylaxis    BLACKBERRIES   Peanuts [Peanut Oil] Anaphylaxis   Shellfish Allergy Anaphylaxis   Betadine [Povidone  Iodine] Other (See Comments)    Due to shellfish allergy    Lexapro [Escitalopram Oxalate] Other (See Comments)   Nexium [Esomeprazole Magnesium] Other (See Comments)    Chest pain, does ok with prilosec   Current Outpatient Medications on File Prior to Visit  Medication Sig Dispense Refill   albuterol (VENTOLIN HFA) 108 (90 Base) MCG/ACT inhaler Inhale 2 puffs into the lungs every 6 (six) hours as needed for wheezing or shortness of breath. 1 each 11   ALPRAZolam (XANAX) 0.5 MG tablet Take 1 tablet (0.5 mg total) by mouth daily as needed. 90 tablet 1   aspirin EC 81 MG tablet Take 81 mg by mouth daily.     atorvastatin (LIPITOR) 10 MG tablet TAKE 1  TABLET BY MOUTH EVERY DAY 90 tablet 2   baclofen (LIORESAL) 10 MG tablet Take 10 mg by mouth as needed for muscle spasms.     buPROPion (WELLBUTRIN) 100 MG tablet 1 tablet by mouth three times a day     clotrimazole-betamethasone (LOTRISONE) cream APPLY 1 APPLICATION TOPICALLY DAILY 30 g 0   cyclobenzaprine (FLEXERIL) 5 MG tablet Take 1 tablet (5 mg total) by mouth 3 (three) times daily as needed. 60 tablet 1   gabapentin (NEURONTIN) 300 MG capsule Take 1 capsule (300 mg total) by mouth 3 (three) times daily. 90 capsule 90   omeprazole (PRILOSEC) 20 MG capsule TAKE 1 CAPSULE BY MOUTH EVERY DAY 30 capsule 2   oxyCODONE-acetaminophen (PERCOCET) 7.5-325 MG per tablet Take as directed by pain management     sildenafil (VIAGRA) 100 MG tablet Take 1/2 to 1 tablet by mouth daily as needed for erectile dysfunction. 20 tablet 4   telmisartan (MICARDIS) 80 MG tablet TAKE 1 TABLET BY MOUTH EVERY DAY 90 tablet 2   No current facility-administered medications on file prior to visit.        ROS:  All others reviewed and negative.  Objective        PE:  BP 130/82 (BP Location: Right Arm, Patient Position: Sitting, Cuff Size: Normal)   Pulse 65   Temp 98 F (36.7 C) (Oral)   Ht 5\' 7"  (1.702 m)   Wt 145 lb (65.8 kg)   SpO2 99%   BMI 22.71 kg/m                 Constitutional: Pt appears in NAD               HENT: Head: NCAT.                Right Ear: External ear normal.                 Left Ear: External ear normal.                Eyes: . Pupils are equal, round, and reactive to light. Conjunctivae and EOM are normal               Nose: without d/c or deformity               Neck: Neck supple. Gross normal ROM               Cardiovascular: Normal rate and regular rhythm.                 Pulmonary/Chest: Effort normal and breath sounds without rales or wheezing.                Abd:  Soft,  NT, ND, + BS, no organomegaly               Neurological: Pt is alert. At baseline orientation, motor grossly  intact               Skin: Skin is warm. No rashes, no other new lesions, LE edema - none               Psychiatric: Pt behavior is normal without agitation   Micro: none  Cardiac tracings I have personally interpreted today:  none  Pertinent Radiological findings (summarize): none   Lab Results  Component Value Date   WBC 5.2 10/05/2022   HGB 14.3 10/05/2022   HCT 40.3 10/05/2022   PLT 171.0 10/05/2022   GLUCOSE 99 04/05/2023   CHOL 130 04/05/2023   TRIG 128.0 04/05/2023   HDL 46.10 04/05/2023   LDLDIRECT 75.0 10/05/2022   LDLCALC 58 04/05/2023   ALT 14 04/05/2023   AST 16 04/05/2023   NA 139 04/05/2023   K 4.0 04/05/2023   CL 103 04/05/2023   CREATININE 1.10 04/05/2023   BUN 18 04/05/2023   CO2 29 04/05/2023   TSH 1.44 10/05/2022   PSA 0.72 10/05/2022   HGBA1C 5.2 04/05/2023   MICROALBUR <0.7 10/05/2022   Assessment/Plan:  Edward Kauffmann. is a 62 y.o. White or Caucasian [1] male with  has a past medical history of Abnormal drug screen (09/2014), Acute asthmatic bronchitis, Allergic rhinitis, Allergy, Anxiety, Asthma (11/16/2015), Benign hypertension, Borderline diabetes mellitus, Cervical disc disease (11/16/2015), Chronic pain syndrome, Depression, Depression with anxiety (10/29/2017), DJD (degenerative joint disease), Erectile dysfunction (10/29/2017), Gastroparesis, GERD (gastroesophageal reflux disease), Habitual alcohol use, Hyperlipidemia, Lumbar disc disease (11/16/2015), and Palpitations.  B12 deficiency Lab Results  Component Value Date   VITAMINB12 816 04/05/2023   Stable, cont oral replacement - b12 1000 mcg qd   Dyslipidemia Lab Results  Component Value Date   CHOL 130 04/05/2023   HDL 46.10 04/05/2023   LDLCALC 58 04/05/2023   LDLDIRECT 75.0 10/05/2022   TRIG 128.0 04/05/2023   CHOLHDL 3 04/05/2023   Stable, cont currrent med tx  Hyperglycemia Lab Results  Component Value Date   HGBA1C 5.2 04/05/2023   Stable, pt to continue current medical  treatment  - diet, wt control   HYPERTENSION, BENIGN BP Readings from Last 3 Encounters:  04/05/23 130/82  10/05/22 124/66  08/19/22 132/85   Stable, pt to continue medical treatment micardis 80 qd   Vitamin D deficiency Last vitamin D Lab Results  Component Value Date   VD25OH 48.65 04/05/2023   Stable, cont oral replacement   Allergic rhinitis Mild to mod, for start allegra 180  every day prn, and nasacort asd,  to f/u any worsening symptoms or concerns Followup: Return in about 6 months (around 10/06/2023).  Oliver Barre, MD 04/05/2023 8:58 PM [facility]

## 2023-04-05 NOTE — Assessment & Plan Note (Signed)
Lab Results  Component Value Date   CHOL 130 04/05/2023   HDL 46.10 04/05/2023   LDLCALC 58 04/05/2023   LDLDIRECT 75.0 10/05/2022   TRIG 128.0 04/05/2023   CHOLHDL 3 04/05/2023   Stable, cont currrent med tx

## 2023-04-05 NOTE — Assessment & Plan Note (Signed)
Lab Results  Component Value Date   HGBA1C 5.2 04/05/2023   Stable, pt to continue current medical treatment  - diet, wt control

## 2023-04-05 NOTE — Assessment & Plan Note (Signed)
Lab Results  Component Value Date   VITAMINB12 816 04/05/2023   Stable, cont oral replacement - b12 1000 mcg qd

## 2023-04-05 NOTE — Assessment & Plan Note (Signed)
Mild to mod, for start allegra 180  every day prn, and nasacort asd,  to f/u any worsening symptoms or concerns

## 2023-04-05 NOTE — Progress Notes (Signed)
The test results show that your current treatment is OK, as the tests are stable.  Please continue the same plan.  There is no other need for change of treatment or further evaluation based on these results, at this time.  thanks 

## 2023-04-05 NOTE — Assessment & Plan Note (Signed)
Last vitamin D Lab Results  Component Value Date   VD25OH 48.65 04/05/2023   Stable, cont oral replacement

## 2023-04-05 NOTE — Patient Instructions (Signed)
Please continue all other medications as before, and refills have been done if requested.  Please have the pharmacy call with any other refills you may need.  Please continue your efforts at being more active, low cholesterol diet, and weight control.  You are otherwise up to date with prevention measures today.  Please keep your appointments with your specialists as you may have planned  Please go to the LAB at the blood drawing area for the tests to be done  You will be contacted by phone if any changes need to be made immediately.  Otherwise, you will receive a letter about your results with an explanation, but please check with MyChart first.  Please remember to sign up for MyChart if you have not done so, as this will be important to you in the future with finding out test results, communicating by private email, and scheduling acute appointments online when needed.  Please make an Appointment to return in 6 months, or sooner if needed 

## 2023-04-06 LAB — VARICELLA ZOSTER ANTIBODY, IGG: Varicella IgG: 2774 index

## 2023-04-07 ENCOUNTER — Telehealth: Payer: Self-pay | Admitting: Internal Medicine

## 2023-04-07 ENCOUNTER — Other Ambulatory Visit: Payer: Self-pay | Admitting: *Deleted

## 2023-04-07 NOTE — Telephone Encounter (Signed)
Called pt he states he don't know how to use the mychart. Gave him MD response on labs. Mailed copy to pt address.Marland KitchenRaechel Chute

## 2023-04-07 NOTE — Telephone Encounter (Signed)
Patient would like a call at [phone] to go over his recent lab results. He would also like to know if they can be mailed to the address on file.

## 2023-05-08 DIAGNOSIS — R0789 Other chest pain: Secondary | ICD-10-CM | POA: Diagnosis not present

## 2023-05-08 DIAGNOSIS — G894 Chronic pain syndrome: Secondary | ICD-10-CM | POA: Diagnosis not present

## 2023-05-08 DIAGNOSIS — K5900 Constipation, unspecified: Secondary | ICD-10-CM | POA: Diagnosis not present

## 2023-05-30 ENCOUNTER — Ambulatory Visit (INDEPENDENT_AMBULATORY_CARE_PROVIDER_SITE_OTHER): Payer: Medicare Other

## 2023-05-30 VITALS — Ht 67.0 in | Wt 140.0 lb

## 2023-05-30 DIAGNOSIS — Z0000 Encounter for general adult medical examination without abnormal findings: Secondary | ICD-10-CM

## 2023-05-30 NOTE — Patient Instructions (Addendum)
Edward Chen , Thank you for taking time to come for your Medicare Wellness Visit. I appreciate your ongoing commitment to your health goals. Please review the following plan we discussed and let me know if I can assist you in the future.   Referrals/Orders/Follow-Ups/Clinician Recommendations: No  This is a list of the screening recommended for you and due dates:  Health Maintenance  Topic Date Due   Zoster (Shingles) Vaccine (1 of 2) Never done   DTaP/Tdap/Td vaccine (2 - Tdap) 09/12/2021   Flu Shot  04/13/2023   Medicare Annual Wellness Visit  05/29/2024   Colon Cancer Screening  10/05/2027   Hepatitis C Screening  Completed   HIV Screening  Completed   HPV Vaccine  Aged Out   COVID-19 Vaccine  Discontinued    Advanced directives: (Declined) Advance directive discussed with you today. Even though you declined this today, please call our office should you change your mind, and we can give you the proper paperwork for you to fill out.  Next Medicare Annual Wellness Visit scheduled for next year: Yes

## 2023-05-30 NOTE — Progress Notes (Signed)
Subjective:   Edward Chen. is a 62 y.o. male who presents for Medicare Annual/Subsequent preventive examination.  Visit Complete: Virtual  I connected with  Edward Chen. on 05/30/23 by a audio enabled telemedicine application and verified that I am speaking with the correct person using two identifiers.  Patient Location: Home  Provider Location: Office/Clinic  I discussed the limitations of evaluation and management by telemedicine. The patient expressed understanding and agreed to proceed.  Vital Signs: Because this visit was a virtual/telehealth visit, some criteria may be missing or patient reported. Any vitals not documented were not able to be obtained and vitals that have been documented are patient reported.   Patient provided his/her weight during this virtual phone visit.  Cardiac Risk Factors include: advanced age (>71men, >65 women);dyslipidemia;family history of premature cardiovascular disease;hypertension;male gender     Objective:    Today's Vitals   05/30/23 1533 05/30/23 1534  Weight: 140 lb (63.5 kg)   Height: 5\' 7"  (1.702 m)   PainSc: 6  6   PainLoc: Generalized    Body mass index is 21.93 kg/m.     05/30/2023    3:34 PM 06/26/2022    8:20 AM 09/30/2021    8:46 AM 09/14/2020    8:24 AM 04/30/2015    7:39 AM 04/28/2015   12:14 PM 02/14/2014    1:59 PM  Advanced Directives  Does Patient Have a Medical Advance Directive? No No No No No No Patient does not have advance directive  Would patient like information on creating a medical advance directive? No - Patient declined  No - Patient declined No - Patient declined No - patient declined information      Current Medications (verified) Outpatient Encounter Medications as of 05/30/2023  Medication Sig   albuterol (VENTOLIN HFA) 108 (90 Base) MCG/ACT inhaler Inhale 2 puffs into the lungs every 6 (six) hours as needed for wheezing or shortness of breath.   ALPRAZolam (XANAX) 0.5 MG tablet Take 1  tablet (0.5 mg total) by mouth daily as needed.   aspirin EC 81 MG tablet Take 81 mg by mouth daily.   atorvastatin (LIPITOR) 10 MG tablet TAKE 1 TABLET BY MOUTH EVERY DAY   baclofen (LIORESAL) 10 MG tablet Take 10 mg by mouth as needed for muscle spasms.   buPROPion (WELLBUTRIN) 100 MG tablet 1 tablet by mouth three times a day   clotrimazole-betamethasone (LOTRISONE) cream APPLY 1 APPLICATION TOPICALLY DAILY   cyclobenzaprine (FLEXERIL) 5 MG tablet Take 1 tablet (5 mg total) by mouth 3 (three) times daily as needed.   gabapentin (NEURONTIN) 300 MG capsule Take 1 capsule (300 mg total) by mouth 3 (three) times daily.   omeprazole (PRILOSEC) 20 MG capsule TAKE 1 CAPSULE BY MOUTH EVERY DAY   oxyCODONE-acetaminophen (PERCOCET) 7.5-325 MG per tablet Take as directed by pain management   sildenafil (VIAGRA) 100 MG tablet Take 1/2 to 1 tablet by mouth daily as needed for erectile dysfunction.   telmisartan (MICARDIS) 80 MG tablet TAKE 1 TABLET BY MOUTH EVERY DAY   No facility-administered encounter medications on file as of 05/30/2023.    Allergies (verified) Other, Peanuts [peanut oil], Shellfish allergy, Betadine [povidone iodine], Lexapro [escitalopram oxalate], and Nexium [esomeprazole magnesium]   History: Past Medical History:  Diagnosis Date   Abnormal drug screen 09/2014   neg xanax (but PRN use), pos EtOH   Acute asthmatic bronchitis    Allergic rhinitis    Allergy    Anxiety  with h/o attacks "on xanax for chest pain"   Asthma 11/16/2015   Benign hypertension    Borderline diabetes mellitus    Cervical disc disease 11/16/2015   Chronic pain syndrome    [facility] - percocets 7.5mg    Depression    Depression with anxiety 10/29/2017   DJD (degenerative joint disease)    Erectile dysfunction 10/29/2017   Gastroparesis    GERD (gastroesophageal reflux disease)    Habitual alcohol use    Hyperlipidemia    Lumbar disc disease 11/16/2015   Palpitations    Past Surgical  History:  Procedure Laterality Date   APPENDECTOMY     CERVICAL DISCECTOMY  4/06   decompression, foraminotomy, and fusion Dr. Jeral Fruit    COLONOSCOPY  11/2012   1 TA, mod diverticulosis, rpt 5 yrs Arlyce Dice)   EAR CYST EXCISION Right 04/30/2015   Procedure: CYST REMOVAL Right knee;  Surgeon: Loreta Ave, MD;  Location: [facility];  Service: Orthopedics;  Laterality: Right;   ESOPHAGOGASTRODUODENOSCOPY  02/2014   gastric polyps, retained gastric residue s/p dilatation Arlyce Dice)   KNEE ARTHROSCOPY WITH LATERAL MENISECTOMY Right 04/30/2015   Procedure: RIGHT KNEE ARTHROSCOPY;  Surgeon: Loreta Ave, MD;  Location: [facility];  Service: Orthopedics;  Laterality: Right;   KNEE ARTHROSCOPY WITH MENISCAL REPAIR Right 04/30/2015   Procedure: KNEE ARTHROSCOPY WITH OPEN MENISCAL REPAIR;  Surgeon: Loreta Ave, MD;  Location: [facility];  Service: Orthopedics;  Laterality: Right;   SHOULDER SURGERY Right 11/05   Arthroscopy, debridement, distal resection   SHOULDER SURGERY Left 05/2011   Dr. Eulah Pont   UPPER GASTROINTESTINAL ENDOSCOPY     X3   Family History  Problem Relation Age of Onset   Lung disease Father        smoker   Cancer Neg Hx    CAD Neg Hx    Stroke Neg Hx    Diabetes Neg Hx    Colon cancer Neg Hx    Esophageal cancer Neg Hx    Stomach cancer Neg Hx    Rectal cancer Neg Hx    Social History   Socioeconomic History   Marital status: Married    Spouse name: Edward Chen   Number of children: Not on file   Years of education: Not on file   Highest education level: Not on file  Occupational History   Occupation: retired on disability  Tobacco Use   Smoking status: Former    Current packs/day: 0.00    Types: Cigarettes    Start date: 10/20/2000    Quit date: 10/20/2020    Years since quitting: 2.6   Smokeless tobacco: Never   Tobacco comments:    smokes a couple of times a week  Vaping Use   Vaping status: Never Used   Substance and Sexual Activity   Alcohol use: Yes    Alcohol/week: 12.0 standard drinks of alcohol    Types: 12 Cans of beer per week    Comment: DRINKS ON WEEKENDS   Drug use: No   Sexual activity: Not on file    Comment: smokes when drinking  Other Topics Concern   Not on file  Social History Narrative   On disability for chronic lower back pain followed by pain clinic   Social Determinants of Health   Financial Resource Strain: Low Risk  (05/30/2023)   Overall Financial Resource Strain (CARDIA)    Difficulty of Paying Living Expenses: Not hard at all  Food Insecurity: No Food Insecurity (05/30/2023)   Hunger Vital Sign    Worried About Running Out of Food in the Last Year: Never true    Ran Out of Food in the Last Year: Never true  Transportation Needs: No Transportation Needs (05/30/2023)   PRAPARE - Administrator, Civil Service (Medical): No    Lack of Transportation (Non-Medical): No  Physical Activity: Inactive (05/30/2023)   Exercise Vital Sign    Days of Exercise per Week: 0 days    Minutes of Exercise per Session: 0 min  Stress: No Stress Concern Present (05/30/2023)   [employer] of Occupational Health - Occupational Stress Questionnaire    Feeling of Stress : Not at all  Social Connections: Patient Declined (05/30/2023)   Social Connection and Isolation Panel [NHANES]    Frequency of Communication with Friends and Family: Patient declined    Frequency of Social Gatherings with Friends and Family: Patient declined    Attends Religious Services: Patient declined    Database administrator or Organizations: Patient declined    Attends Engineer, structural: Patient declined    Marital Status: Patient declined    Tobacco Counseling Counseling given: Not Answered Tobacco comments: smokes a couple of times a week   Clinical Intake:  Pre-visit preparation completed: Yes  Pain : 0-10 Pain Score: 6  Pain Type: Chronic pain Pain Location:  Generalized     BMI - recorded: 21.93 Nutritional Risks: None Diabetes: No  How often do you need to have someone help you when you read instructions, pamphlets, or other written materials from your doctor or pharmacy?: 1 - Never What is the last grade level you completed in school?: 8TH GRADE  Interpreter Needed?: No  Information entered by :: Susie Cassette, LPN.   Activities of Daily Living    05/30/2023    3:36 PM  In your present state of health, do you have any difficulty performing the following activities:  Hearing? 0  Vision? 0  Difficulty concentrating or making decisions? 0  Walking or climbing stairs? 0  Dressing or bathing? 0  Doing errands, shopping? 0  Preparing Food and eating ? N  Using the Toilet? N  In the past six months, have you accidently leaked urine? N  Do you have problems with loss of bowel control? N  Managing your Medications? N  Managing your Finances? N  Housekeeping or managing your Housekeeping? N    Patient Care Team: Corwin Levins, MD as PCP - General (Internal Medicine)  Indicate any recent Medical Services you may have received from other than [facility] providers in the past year (date may be approximate).     Assessment:   This is a routine wellness examination for Advith.  Hearing/Vision screen Hearing Screening - Comments:: Patient denied any hearing difficulty.   No hearing aids.   Vision Screening - Comments:: Patient does wear otc readers.  Hard to read small print. No current eye doctor    Goals Addressed             This Visit's Progress    Patient Stated       To maintain my health and stay independent.      Depression Screen    05/30/2023    3:36 PM 04/05/2023    9:40 AM 10/05/2022    9:11 AM 10/05/2022    8:43 AM 04/04/2022    8:04 AM 09/30/2021    8:56 AM 03/12/2021  9:06 AM  PHQ 2/9 Scores  PHQ - 2 Score 0 0 0 0 2 0 0  PHQ- 9 Score 0   0 6  0    Fall Risk    05/30/2023    3:36 PM 04/05/2023     9:39 AM 10/05/2022    9:11 AM 10/05/2022    8:43 AM 04/04/2022    8:04 AM  Fall Risk   Falls in the past year? 0 0 0 0 0  Number falls in past yr: 0 0 0 0 0  Injury with Fall? 0 0 0 0 0  Risk for fall due to : No Fall Risks No Fall Risks  No Fall Risks   Follow up Falls prevention discussed Falls evaluation completed  Falls evaluation completed     MEDICARE RISK AT HOME: Medicare Risk at Home Any stairs in or around the home?: No If so, are there any without handrails?: No Home free of loose throw rugs in walkways, pet beds, electrical cords, etc?: Yes Adequate lighting in your home to reduce risk of falls?: Yes Life alert?: No Use of a cane, walker or w/c?: No Grab bars in the bathroom?: No Shower chair or bench in shower?: No Elevated toilet seat or a handicapped toilet?: No  TIMED UP AND GO:  Was the test performed?  No    Cognitive Function:        05/30/2023    3:36 PM  6CIT Screen  What Year? 0 points  What month? 0 points  What time? 0 points  Count back from 20 0 points  Months in reverse 0 points  Repeat phrase 0 points  Total Score 0 points    Immunizations Immunization History  Administered Date(s) Administered   Influenza Whole 06/12/2004   PFIZER(Purple Top)SARS-COV-2 Vaccination 12/30/2019, 01/20/2020   Pneumococcal Polysaccharide-23 10/12/2009   Td 09/13/2011    TDAP status: Due, Education has been provided regarding the importance of this vaccine. Advised may receive this vaccine at local pharmacy or Health Dept. Aware to provide a copy of the vaccination record if obtained from local pharmacy or Health Dept. Verbalized acceptance and understanding.  Flu Vaccine status: Declined, Education has been provided regarding the importance of this vaccine but patient still declined. Advised may receive this vaccine at local pharmacy or Health Dept. Aware to provide a copy of the vaccination record if obtained from local pharmacy or Health Dept. Verbalized  acceptance and understanding.  Pneumococcal vaccine status: Declined,  Education has been provided regarding the importance of this vaccine but patient still declined. Advised may receive this vaccine at local pharmacy or Health Dept. Aware to provide a copy of the vaccination record if obtained from local pharmacy or Health Dept. Verbalized acceptance and understanding.   Covid-19 vaccine status: Declined, Education has been provided regarding the importance of this vaccine but patient still declined. Advised may receive this vaccine at local pharmacy or Health Dept.or vaccine clinic. Aware to provide a copy of the vaccination record if obtained from local pharmacy or Health Dept. Verbalized acceptance and understanding.  Qualifies for Shingles Vaccine? Yes   Zostavax completed No   Shingrix Completed?: No.    Education has been provided regarding the importance of this vaccine. Patient has been advised to call insurance company to determine out of pocket expense if they have not yet received this vaccine. Advised may also receive vaccine at local pharmacy or Health Dept. Verbalized acceptance and understanding.  Screening Tests Health Maintenance  Topic  Date Due   Zoster Vaccines- Shingrix (1 of 2) Never done   DTaP/Tdap/Td (2 - Tdap) 09/12/2021   INFLUENZA VACCINE  04/13/2023   Medicare Annual Wellness (AWV)  05/29/2024   Colonoscopy  10/05/2027   Hepatitis C Screening  Completed   HIV Screening  Completed   HPV VACCINES  Aged Out   COVID-19 Vaccine  Discontinued    Health Maintenance  Health Maintenance Due  Topic Date Due   Zoster Vaccines- Shingrix (1 of 2) Never done   DTaP/Tdap/Td (2 - Tdap) 09/12/2021   INFLUENZA VACCINE  04/13/2023    Colorectal cancer screening: Type of screening: Colonoscopy. Completed 10/04/2017. Repeat every 10 years  Lung Cancer Screening: (Low Dose CT Chest recommended if Age 76-80 years, 20 pack-year currently smoking OR have quit w/in 15years.)  does not qualify.   Lung Cancer Screening Referral: no  Additional Screening:  Hepatitis C Screening: does qualify; Completed 11/16/2015  Vision Screening: Recommended annual ophthalmology exams for early detection of glaucoma and other disorders of the eye. Is the patient up to date with their annual eye exam?  No  Who is the provider or what is the name of the office in which the patient attends annual eye exams? declined If pt is not established with a provider, would they like to be referred to a provider to establish care? No .   Dental Screening: Recommended annual dental exams for proper oral hygiene  Diabetic Foot Exam: N/A  Community Resource Referral / Chronic Care Management: CRR required this visit?  No   CCM required this visit?  No     Plan:     I have personally reviewed and noted the following in the patient's chart:   Medical and social history Use of alcohol, tobacco or illicit drugs  Current medications and supplements including opioid prescriptions. Patient is currently taking opioid prescriptions. Information provided to patient regarding non-opioid alternatives. Patient advised to discuss non-opioid treatment plan with their provider. Functional ability and status Nutritional status Physical activity Advanced directives List of other physicians Hospitalizations, surgeries, and ER visits in previous 12 months Vitals Screenings to include cognitive, depression, and falls Referrals and appointments  In addition, I have reviewed and discussed with patient certain preventive protocols, quality metrics, and best practice recommendations. A written personalized care plan for preventive services as well as general preventive health recommendations were provided to patient.     Mickeal Needy, LPN   12/19/8117   After Visit Summary: (Mail) Due to this being a telephonic visit, the after visit summary with patients personalized plan was offered to patient  via mail   Nurse Notes: Normal cognitive status assessed by direct observation via telephone conversation by this Nurse Health Advisor. No abnormalities found.

## 2023-06-14 ENCOUNTER — Telehealth: Payer: Self-pay | Admitting: Internal Medicine

## 2023-06-14 MED ORDER — ALPRAZOLAM 0.5 MG PO TABS: 0.5000 mg | ORAL_TABLET | Freq: Every day | ORAL | 1 refills | Status: DC | PRN

## 2023-06-14 NOTE — Telephone Encounter (Signed)
Prescription Request  06/14/2023  LOV: 04/05/2023  What is the name of the medication or equipment? ALPRAZolam (XANAX) 0.5 MG tablet   Have you contacted your pharmacy to request a refill? No   Which pharmacy would you like this sent to?  [id number], [place] - [address] [place] [address] Phone: [phone] Fax: [phone]    Patient notified that their request is being sent to the clinical staff for review and that they should receive a response within 2 business days.   Please advise at Mobile [phone] (mobile)

## 2023-06-14 NOTE — Telephone Encounter (Signed)
Done erx 

## 2023-07-09 ENCOUNTER — Other Ambulatory Visit: Payer: Self-pay | Admitting: Internal Medicine

## 2023-07-10 ENCOUNTER — Other Ambulatory Visit: Payer: Self-pay

## 2023-07-10 DIAGNOSIS — K5900 Constipation, unspecified: Secondary | ICD-10-CM | POA: Diagnosis not present

## 2023-07-10 DIAGNOSIS — G894 Chronic pain syndrome: Secondary | ICD-10-CM | POA: Diagnosis not present

## 2023-07-10 DIAGNOSIS — R0789 Other chest pain: Secondary | ICD-10-CM | POA: Diagnosis not present

## 2023-07-24 ENCOUNTER — Other Ambulatory Visit: Payer: Self-pay

## 2023-07-24 ENCOUNTER — Telehealth: Payer: Self-pay | Admitting: Internal Medicine

## 2023-07-24 MED ORDER — CLOTRIMAZOLE-BETAMETHASONE 1-0.05 % EX CREA: 1.0000 | TOPICAL_CREAM | Freq: Every day | CUTANEOUS | 0 refills | Status: DC

## 2023-07-24 NOTE — Telephone Encounter (Signed)
Prescription Request  07/24/2023  LOV: 04/05/2023  What is the name of the medication or equipment? Lotrisone cream - generic  Have you contacted your pharmacy to request a refill? Yes   Which pharmacy would you like this sent to?  [id number], [place] - [address] [place] [address] Phone: [phone] Fax: [phone]     Patient notified that their request is being sent to the clinical staff for review and that they should receive a response within 2 business days.   Please advise at Mobile [phone] (mobile)

## 2023-07-24 NOTE — Telephone Encounter (Signed)
Refill sent.

## 2023-07-28 ENCOUNTER — Other Ambulatory Visit: Payer: Self-pay | Admitting: Internal Medicine

## 2023-09-04 DIAGNOSIS — R0789 Other chest pain: Secondary | ICD-10-CM | POA: Diagnosis not present

## 2023-09-04 DIAGNOSIS — G894 Chronic pain syndrome: Secondary | ICD-10-CM | POA: Diagnosis not present

## 2023-09-04 DIAGNOSIS — K5900 Constipation, unspecified: Secondary | ICD-10-CM | POA: Diagnosis not present

## 2023-10-06 ENCOUNTER — Ambulatory Visit: Payer: Medicare Other | Admitting: Internal Medicine

## 2023-10-06 ENCOUNTER — Encounter: Payer: Self-pay | Admitting: Internal Medicine

## 2023-10-06 VITALS — BP 120/78 | HR 72 | Temp 98.0°F | Ht 67.0 in | Wt 148.0 lb

## 2023-10-06 DIAGNOSIS — E538 Deficiency of other specified B group vitamins: Secondary | ICD-10-CM

## 2023-10-06 DIAGNOSIS — E785 Hyperlipidemia, unspecified: Secondary | ICD-10-CM | POA: Diagnosis not present

## 2023-10-06 DIAGNOSIS — M25522 Pain in left elbow: Secondary | ICD-10-CM | POA: Diagnosis not present

## 2023-10-06 DIAGNOSIS — G894 Chronic pain syndrome: Secondary | ICD-10-CM

## 2023-10-06 DIAGNOSIS — R739 Hyperglycemia, unspecified: Secondary | ICD-10-CM

## 2023-10-06 DIAGNOSIS — E559 Vitamin D deficiency, unspecified: Secondary | ICD-10-CM | POA: Diagnosis not present

## 2023-10-06 DIAGNOSIS — Z0001 Encounter for general adult medical examination with abnormal findings: Secondary | ICD-10-CM

## 2023-10-06 DIAGNOSIS — M19049 Primary osteoarthritis, unspecified hand: Secondary | ICD-10-CM

## 2023-10-06 DIAGNOSIS — I10 Essential (primary) hypertension: Secondary | ICD-10-CM

## 2023-10-06 DIAGNOSIS — R972 Elevated prostate specific antigen [PSA]: Secondary | ICD-10-CM | POA: Diagnosis not present

## 2023-10-06 LAB — LIPID PANEL
Cholesterol: 170 mg/dL (ref 0–200)
HDL: 58.1 mg/dL (ref >39.00)
LDL Cholesterol: 74 mg/dL (ref 0–99)
NonHDL: 112.35
Total CHOL/HDL Ratio: 3
Triglycerides: 191 mg/dL — ABNORMAL HIGH (ref 0.0–149.0)
VLDL: 38.2 mg/dL (ref 0.0–40.0)

## 2023-10-06 LAB — PSA: PSA: 0.95 ng/mL (ref 0.10–4.00)

## 2023-10-06 LAB — URINALYSIS, ROUTINE W REFLEX MICROSCOPIC
Bilirubin Urine: NEGATIVE
Hgb urine dipstick: NEGATIVE
Ketones, ur: NEGATIVE
Leukocytes,Ua: NEGATIVE
Nitrite: NEGATIVE
Specific Gravity, Urine: 1.02 (ref 1.000–1.030)
Total Protein, Urine: NEGATIVE
Urine Glucose: NEGATIVE
Urobilinogen, UA: 0.2 (ref 0.0–1.0)
pH: 6 (ref 5.0–8.0)

## 2023-10-06 LAB — CBC WITH DIFFERENTIAL/PLATELET
Basophils Absolute: 0 10*3/uL (ref 0.0–0.1)
Basophils Relative: 0.8 % (ref 0.0–3.0)
Eosinophils Absolute: 0.1 10*3/uL (ref 0.0–0.7)
Eosinophils Relative: 2.2 % (ref 0.0–5.0)
HCT: 41.8 % (ref 39.0–52.0)
Hemoglobin: 14.6 g/dL (ref 13.0–17.0)
Lymphocytes Relative: 34.7 % (ref 12.0–46.0)
Lymphs Abs: 1.8 10*3/uL (ref 0.7–4.0)
MCHC: 35 g/dL (ref 30.0–36.0)
MCV: 91.6 fL (ref 78.0–100.0)
Monocytes Absolute: 0.4 10*3/uL (ref 0.1–1.0)
Monocytes Relative: 7.4 % (ref 3.0–12.0)
Neutro Abs: 2.9 10*3/uL (ref 1.4–7.7)
Neutrophils Relative %: 54.9 % (ref 43.0–77.0)
Platelets: 170 10*3/uL (ref 150.0–400.0)
RBC: 4.57 Mil/uL (ref 4.22–5.81)
RDW: 12.8 % (ref 11.5–15.5)
WBC: 5.2 10*3/uL (ref 4.0–10.5)

## 2023-10-06 LAB — BASIC METABOLIC PANEL
BUN: 19 mg/dL (ref 6–23)
CO2: 30 meq/L (ref 19–32)
Calcium: 9.4 mg/dL (ref 8.4–10.5)
Chloride: 103 meq/L (ref 96–112)
Creatinine, Ser: 0.93 mg/dL (ref 0.40–1.50)
GFR: 86.65 mL/min (ref >60.00)
Glucose, Bld: 93 mg/dL (ref 70–99)
Potassium: 4.4 meq/L (ref 3.5–5.1)
Sodium: 142 meq/L (ref 135–145)

## 2023-10-06 LAB — HEMOGLOBIN A1C: Hgb A1c MFr Bld: 5.3 % (ref 4.6–6.5)

## 2023-10-06 LAB — MICROALBUMIN / CREATININE URINE RATIO
Creatinine,U: 108.6 mg/dL
Microalb Creat Ratio: 0.6 mg/g (ref 0.0–30.0)
Microalb, Ur: <0.7 mg/dL (ref 0.0–1.9)

## 2023-10-06 LAB — HEPATIC FUNCTION PANEL
ALT: 13 U/L (ref 0–53)
AST: 16 U/L (ref 0–37)
Albumin: 4.9 g/dL (ref 3.5–5.2)
Alkaline Phosphatase: 59 U/L (ref 39–117)
Bilirubin, Direct: 0.2 mg/dL (ref 0.0–0.3)
Total Bilirubin: 0.8 mg/dL (ref 0.2–1.2)
Total Protein: 7.3 g/dL (ref 6.0–8.3)

## 2023-10-06 LAB — TSH: TSH: 1.45 u[IU]/mL (ref 0.35–5.50)

## 2023-10-06 LAB — VITAMIN D 25 HYDROXY (VIT D DEFICIENCY, FRACTURES): VITD: 33.83 ng/mL (ref 30.00–100.00)

## 2023-10-06 LAB — VITAMIN B12: Vitamin B-12: 581 pg/mL (ref 211–911)

## 2023-10-06 MED ORDER — SILDENAFIL CITRATE 100 MG PO TABS: ORAL_TABLET | ORAL | 4 refills | Status: DC

## 2023-10-06 MED ORDER — OMEPRAZOLE 20 MG PO CPDR: 20.0000 mg | DELAYED_RELEASE_CAPSULE | Freq: Every day | ORAL | 3 refills | Status: DC

## 2023-10-06 MED ORDER — ALBUTEROL SULFATE HFA 108 (90 BASE) MCG/ACT IN AERS: 2.0000 | INHALATION_SPRAY | Freq: Four times a day (QID) | RESPIRATORY_TRACT | 11 refills | Status: AC | PRN

## 2023-10-06 MED ORDER — ALPRAZOLAM 0.5 MG PO TABS: 0.5000 mg | ORAL_TABLET | Freq: Every day | ORAL | 1 refills | Status: DC | PRN

## 2023-10-06 MED ORDER — GABAPENTIN 300 MG PO CAPS
300.0000 mg | ORAL_CAPSULE | Freq: Three times a day (TID) | ORAL | 1 refills | Status: AC
Start: 2023-10-06 — End: ?

## 2023-10-06 MED ORDER — ATORVASTATIN CALCIUM 10 MG PO TABS: 10.0000 mg | ORAL_TABLET | Freq: Every day | ORAL | 3 refills | Status: DC

## 2023-10-06 MED ORDER — MELOXICAM 15 MG PO TABS: 15.0000 mg | ORAL_TABLET | Freq: Every day | ORAL | 3 refills | Status: AC | PRN

## 2023-10-06 MED ORDER — TELMISARTAN 80 MG PO TABS: 80.0000 mg | ORAL_TABLET | Freq: Every day | ORAL | 3 refills | Status: DC

## 2023-10-06 MED ORDER — CYCLOBENZAPRINE HCL 5 MG PO TABS
5.0000 mg | ORAL_TABLET | Freq: Three times a day (TID) | ORAL | 1 refills | Status: AC | PRN
Start: 2023-10-06 — End: ?

## 2023-10-06 NOTE — Progress Notes (Unsigned)
Patient ID: Edward Ace., male   DOB: 05-17-1961, 63 y.o.   MRN: [id number]         Chief Complaint:: wellness exam and chronic lbp, hand arthritis, elbow left tendonitis, low b12, hld, hyperglycemia, htn, low vit d       HPI:  Edward Chen. is a 63 y.o. male here for wellness exam; for tdap and shingrix at pharmacy, declines flu and pneumonia shots today, o/w up to date                        Also Pt denies chest pain, increased sob or doe, wheezing, orthopnea, PND, increased LE swelling, palpitations, dizziness or syncope.   Pt denies polydipsia, polyuria, or new focal neuro s/s.    Pt denies fever, wt loss, night sweats, loss of appetite, or other constitutional symptoms  Pt continues to have recurring LBP without change in severity, bowel or bladder change, fever, wt loss,  worsening LE pain/numbness/weakness, gait change or falls, and also had bilateral hand arthritis mild, as well as left elbow medial epicondylar tendonitis today.     Wt Readings from Last 3 Encounters:  10/06/23 148 lb (67.1 kg)  05/30/23 140 lb (63.5 kg)  04/05/23 145 lb (65.8 kg)   BP Readings from Last 3 Encounters:  10/06/23 120/78  04/05/23 130/82  10/05/22 124/66   Immunization History  Administered Date(s) Administered   Influenza Whole 06/12/2004   PFIZER(Purple Top)SARS-COV-2 Vaccination 12/30/2019, 01/20/2020   Pneumococcal Polysaccharide-23 10/12/2009   Td 09/13/2011   Health Maintenance Due  Topic Date Due   Zoster Vaccines- Shingrix (1 of 2) Never done   DTaP/Tdap/Td (2 - Tdap) 09/12/2021      Past Medical History:  Diagnosis Date   Abnormal drug screen 09/2014   neg xanax (but PRN use), pos EtOH   Acute asthmatic bronchitis    Allergic rhinitis    Allergy    Anxiety    with h/o attacks "on xanax for chest pain"   Asthma 11/16/2015   Benign hypertension    Borderline diabetes mellitus    Cervical disc disease 11/16/2015   Chronic pain syndrome    [facility] - percocets  7.5mg    Depression    Depression with anxiety 10/29/2017   DJD (degenerative joint disease)    Erectile dysfunction 10/29/2017   Gastroparesis    GERD (gastroesophageal reflux disease)    Habitual alcohol use    Hyperlipidemia    Lumbar disc disease 11/16/2015   Palpitations    Past Surgical History:  Procedure Laterality Date   APPENDECTOMY     CERVICAL DISCECTOMY  4/06   decompression, foraminotomy, and fusion Dr. Jeral Fruit    COLONOSCOPY  11/2012   1 TA, mod diverticulosis, rpt 5 yrs Arlyce Dice)   EAR CYST EXCISION Right 04/30/2015   Procedure: CYST REMOVAL Right knee;  Surgeon: Loreta Ave, MD;  Location: [facility];  Service: Orthopedics;  Laterality: Right;   ESOPHAGOGASTRODUODENOSCOPY  02/2014   gastric polyps, retained gastric residue s/p dilatation Arlyce Dice)   KNEE ARTHROSCOPY WITH LATERAL MENISECTOMY Right 04/30/2015   Procedure: RIGHT KNEE ARTHROSCOPY;  Surgeon: Loreta Ave, MD;  Location: [facility];  Service: Orthopedics;  Laterality: Right;   KNEE ARTHROSCOPY WITH MENISCAL REPAIR Right 04/30/2015   Procedure: KNEE ARTHROSCOPY WITH OPEN MENISCAL REPAIR;  Surgeon: Loreta Ave, MD;  Location: [facility];  Service: Orthopedics;  Laterality: Right;  SHOULDER SURGERY Right 11/05   Arthroscopy, debridement, distal resection   SHOULDER SURGERY Left 05/2011   Dr. Eulah Pont   UPPER GASTROINTESTINAL ENDOSCOPY     X3    reports that he quit smoking about 2 years ago. His smoking use included cigarettes. He started smoking about 22 years ago. He has never used smokeless tobacco. He reports current alcohol use of about 12.0 standard drinks of alcohol per week. He reports that he does not use drugs. family history includes Lung disease in his father. Allergies  Allergen Reactions   Other Anaphylaxis    BLACKBERRIES   Peanuts [Peanut Oil] Anaphylaxis   Shellfish Allergy Anaphylaxis   Betadine [Povidone Iodine] Other (See Comments)    Due  to shellfish allergy    Lexapro [Escitalopram Oxalate] Other (See Comments)   Nexium [Esomeprazole Magnesium] Other (See Comments)    Chest pain, does ok with prilosec   Current Outpatient Medications on File Prior to Visit  Medication Sig Dispense Refill   aspirin EC 81 MG tablet Take 81 mg by mouth daily.     baclofen (LIORESAL) 10 MG tablet Take 10 mg by mouth as needed for muscle spasms.     buPROPion (WELLBUTRIN) 100 MG tablet 1 tablet by mouth three times a day     clotrimazole-betamethasone (LOTRISONE) cream Apply 1 Application topically daily. 30 g 0   oxyCODONE-acetaminophen (PERCOCET) 7.5-325 MG per tablet Take as directed by pain management     No current facility-administered medications on file prior to visit.        ROS:  All others reviewed and negative.  Objective        PE:  BP 120/78 (BP Location: Left Arm, Patient Position: Sitting, Cuff Size: Normal)   Pulse 72   Temp 98 F (36.7 C) (Oral)   Ht 5\' 7"  (1.702 m)   Wt 148 lb (67.1 kg)   SpO2 98%   BMI 23.18 kg/m                 Constitutional: Pt appears in NAD               HENT: Head: NCAT.                Right Ear: External ear normal.                 Left Ear: External ear normal.                Eyes: . Pupils are equal, round, and reactive to light. Conjunctivae and EOM are normal               Nose: without d/c or deformity               Neck: Neck supple. Gross normal ROM               Cardiovascular: Normal rate and regular rhythm.                 Pulmonary/Chest: Effort normal and breath sounds without rales or wheezing.                Abd:  Soft, NT, ND, + BS, no organomegaly               Neurological: Pt is alert. At baseline orientation, motor grossly intact               Skin: Skin is warm. No rashes, no other new lesions, LE  edema - none               Psychiatric: Pt behavior is normal without agitation   Micro: none  Cardiac tracings I have personally interpreted today:  none  Pertinent  Radiological findings (summarize): none   Lab Results  Component Value Date   WBC 5.2 10/06/2023   HGB 14.6 10/06/2023   HCT 41.8 10/06/2023   PLT 170.0 10/06/2023   GLUCOSE 93 10/06/2023   CHOL 170 10/06/2023   TRIG 191.0 (H) 10/06/2023   HDL 58.10 10/06/2023   LDLDIRECT 75.0 10/05/2022   LDLCALC 74 10/06/2023   ALT 13 10/06/2023   AST 16 10/06/2023   NA 142 10/06/2023   K 4.4 10/06/2023   CL 103 10/06/2023   CREATININE 0.93 10/06/2023   BUN 19 10/06/2023   CO2 30 10/06/2023   TSH 1.45 10/06/2023   PSA 0.95 10/06/2023   HGBA1C 5.3 10/06/2023   MICROALBUR <0.7 10/06/2023   Assessment/Plan:  Shravan Salahuddin. is a 63 y.o. White or Caucasian [1] male with  has a past medical history of Abnormal drug screen (09/2014), Acute asthmatic bronchitis, Allergic rhinitis, Allergy, Anxiety, Asthma (11/16/2015), Benign hypertension, Borderline diabetes mellitus, Cervical disc disease (11/16/2015), Chronic pain syndrome, Depression, Depression with anxiety (10/29/2017), DJD (degenerative joint disease), Erectile dysfunction (10/29/2017), Gastroparesis, GERD (gastroesophageal reflux disease), Habitual alcohol use, Hyperlipidemia, Lumbar disc disease (11/16/2015), and Palpitations.  Encounter for well adult exam with abnormal findings Age and sex appropriate education and counseling updated with regular exercise and diet Referrals for preventative services - none needed Immunizations addressed - for tdap and shingrix at pharmacy Smoking counseling  - none needed Evidence for depression or other mood disorder - none significant Most recent labs reviewed. I have personally reviewed and have noted: 1) the patient's medical and social history 2) The patient's current medications and supplements 3) The patient's height, weight, and BMI have been recorded in the chart   B12 deficiency Lab Results  Component Value Date   VITAMINB12 581 10/06/2023   Stable, cont oral replacement - b12 1000 mcg  qd   Dyslipidemia Lab Results  Component Value Date   LDLCALC 74 10/06/2023   Stable, pt to continue current statin lipitor 10 qd   Hyperglycemia Lab Results  Component Value Date   HGBA1C 5.3 10/06/2023   Stable, pt to continue current medical treatment  - diet, wt control   HYPERTENSION, BENIGN BP Readings from Last 3 Encounters:  10/06/23 120/78  04/05/23 130/82  10/05/22 124/66   Stable, pt to continue medical treatment micardis 80 qd   Vitamin D deficiency Last vitamin D Lab Results  Component Value Date   VD25OH 33.83 10/06/2023   Low, to start oral replacement   Elevated PSA Lab Results  Component Value Date   PSA 0.95 10/06/2023   PSA 0.72 10/05/2022   PSA 0.85 09/16/2021   Stable, cont to monitor  Left elbow pain Mild, for mobic 15 mg every day prn  Hand arthritis Mild, for mobic 15 mg every day prn  Chronic pain syndrome With lower back pain - Mild, for mobic 15 mg every day prn  Followup: Return in about 6 months (around 04/04/2024).  Oliver Barre, MD 10/11/2023 12:24 PM [facility]

## 2023-10-06 NOTE — Patient Instructions (Addendum)
Please take all new medication as prescribed - the anti-inflammatory as needed  Please continue all other medications as before, and refills have been done if requested.  Please have the pharmacy call with any other refills you may need.  Please continue your efforts at being more active, low cholesterol diet, and weight control.  You are otherwise up to date with prevention measures today.  Please keep your appointments with your specialists as you may have planned  Please go to the LAB at the blood drawing area for the tests to be done  You will be contacted by phone if any changes need to be made immediately.  Otherwise, you will receive a letter about your results with an explanation, but please check with MyChart first.  Please make an Appointment to return in 6 months, or sooner if needed

## 2023-10-11 ENCOUNTER — Encounter: Payer: Self-pay | Admitting: Internal Medicine

## 2023-10-11 DIAGNOSIS — M19049 Primary osteoarthritis, unspecified hand: Secondary | ICD-10-CM | POA: Insufficient documentation

## 2023-10-11 DIAGNOSIS — M25522 Pain in left elbow: Secondary | ICD-10-CM | POA: Insufficient documentation

## 2023-10-11 NOTE — Assessment & Plan Note (Signed)
BP Readings from Last 3 Encounters:  10/06/23 120/78  04/05/23 130/82  10/05/22 124/66   Stable, pt to continue medical treatment micardis 80 qd

## 2023-10-11 NOTE — Assessment & Plan Note (Signed)
Mild, for mobic 15 mg every day prn

## 2023-10-11 NOTE — Assessment & Plan Note (Signed)
Lab Results  Component Value Date   VITAMINB12 581 10/06/2023   Stable, cont oral replacement - b12 1000 mcg qd

## 2023-10-11 NOTE — Assessment & Plan Note (Signed)

## 2023-10-11 NOTE — Assessment & Plan Note (Signed)
Lab Results  Component Value Date   PSA 0.95 10/06/2023   PSA 0.72 10/05/2022   PSA 0.85 09/16/2021   Stable, cont to monitor

## 2023-10-11 NOTE — Assessment & Plan Note (Signed)
With lower back pain - Mild, for mobic 15 mg every day prn

## 2023-10-11 NOTE — Assessment & Plan Note (Signed)
Lab Results  Component Value Date   LDLCALC 74 10/06/2023   Stable, pt to continue current statin lipitor 10 qd

## 2023-10-11 NOTE — Assessment & Plan Note (Signed)
Lab Results  Component Value Date   HGBA1C 5.3 10/06/2023   Stable, pt to continue current medical treatment  - diet, wt control

## 2023-10-11 NOTE — Assessment & Plan Note (Signed)
Last vitamin D Lab Results  Component Value Date   VD25OH 33.83 10/06/2023   Low, to start oral replacement

## 2023-10-30 DIAGNOSIS — G894 Chronic pain syndrome: Secondary | ICD-10-CM | POA: Diagnosis not present

## 2023-10-30 DIAGNOSIS — R0789 Other chest pain: Secondary | ICD-10-CM | POA: Diagnosis not present

## 2023-10-30 DIAGNOSIS — K5900 Constipation, unspecified: Secondary | ICD-10-CM | POA: Diagnosis not present

## 2023-12-01 ENCOUNTER — Other Ambulatory Visit: Payer: Self-pay | Admitting: Internal Medicine

## 2023-12-04 ENCOUNTER — Other Ambulatory Visit: Payer: Self-pay

## 2023-12-25 DIAGNOSIS — G894 Chronic pain syndrome: Secondary | ICD-10-CM | POA: Diagnosis not present

## 2023-12-25 DIAGNOSIS — R0789 Other chest pain: Secondary | ICD-10-CM | POA: Diagnosis not present

## 2023-12-25 DIAGNOSIS — Z79891 Long term (current) use of opiate analgesic: Secondary | ICD-10-CM | POA: Diagnosis not present

## 2023-12-25 DIAGNOSIS — K5900 Constipation, unspecified: Secondary | ICD-10-CM | POA: Diagnosis not present

## 2023-12-25 DIAGNOSIS — F329 Major depressive disorder, single episode, unspecified: Secondary | ICD-10-CM | POA: Diagnosis not present

## 2024-02-16 DIAGNOSIS — M79674 Pain in right toe(s): Secondary | ICD-10-CM | POA: Diagnosis not present

## 2024-02-20 ENCOUNTER — Telehealth: Payer: Self-pay | Admitting: Internal Medicine

## 2024-02-20 NOTE — Telephone Encounter (Signed)
 Patient left FMLA forms up front in Dr. Koleen Perna Box.  Patient would like them filled out and faxed but would also like a copy mailed to him.

## 2024-02-21 NOTE — Telephone Encounter (Signed)
 Placed on provider desk

## 2024-02-26 DIAGNOSIS — F329 Major depressive disorder, single episode, unspecified: Secondary | ICD-10-CM | POA: Diagnosis not present

## 2024-02-26 DIAGNOSIS — K5900 Constipation, unspecified: Secondary | ICD-10-CM | POA: Diagnosis not present

## 2024-02-26 DIAGNOSIS — R0789 Other chest pain: Secondary | ICD-10-CM | POA: Diagnosis not present

## 2024-02-26 DIAGNOSIS — G894 Chronic pain syndrome: Secondary | ICD-10-CM | POA: Diagnosis not present

## 2024-02-29 NOTE — Telephone Encounter (Unsigned)
 Copied from CRM [id number]. Topic: General - Other >> Feb 29, 2024  3:02 PM Baldo Levan wrote: Reason for CRM: Patient is calling in regarding the FMLA paperwork, patient is requesting a update on the paperwork as its been over a week.

## 2024-03-05 NOTE — Telephone Encounter (Signed)
 Called and informed Pt and let him know he will be receiving a copy of the forms in the mail as well.

## 2024-03-05 NOTE — Telephone Encounter (Signed)
 Forms have been faxed today.

## 2024-03-27 ENCOUNTER — Other Ambulatory Visit: Payer: Self-pay | Admitting: Internal Medicine

## 2024-04-24 DIAGNOSIS — M79674 Pain in right toe(s): Secondary | ICD-10-CM | POA: Diagnosis not present

## 2024-04-25 DIAGNOSIS — R0789 Other chest pain: Secondary | ICD-10-CM | POA: Diagnosis not present

## 2024-04-25 DIAGNOSIS — F329 Major depressive disorder, single episode, unspecified: Secondary | ICD-10-CM | POA: Diagnosis not present

## 2024-04-25 DIAGNOSIS — K5900 Constipation, unspecified: Secondary | ICD-10-CM | POA: Diagnosis not present

## 2024-04-25 DIAGNOSIS — G894 Chronic pain syndrome: Secondary | ICD-10-CM | POA: Diagnosis not present

## 2024-04-29 DIAGNOSIS — M79674 Pain in right toe(s): Secondary | ICD-10-CM | POA: Diagnosis not present

## 2024-05-24 ENCOUNTER — Encounter: Payer: Self-pay | Admitting: Pharmacist

## 2024-05-24 NOTE — Progress Notes (Signed)
 Pharmacy Quality Measure Review  This patient is appearing on a report for being at risk of failing the adherence measure for hypertension (ACEi/ARB) medications this calendar year.   Medication: telmisartan  Last fill date: 05/18/24 for 90 day supply  Insurance report was not up to date. No action needed at this time.   Catie [facility], PharmD, [facility] Clinical Pharmacist [phone]

## 2024-05-30 ENCOUNTER — Ambulatory Visit (INDEPENDENT_AMBULATORY_CARE_PROVIDER_SITE_OTHER): Payer: Medicare Other

## 2024-05-30 VITALS — Ht 67.0 in | Wt 148.0 lb

## 2024-05-30 DIAGNOSIS — Z0000 Encounter for general adult medical examination without abnormal findings: Secondary | ICD-10-CM

## 2024-05-30 NOTE — Patient Instructions (Signed)
 Edward Chen,  Thank you for taking the time for your Medicare Wellness Visit. I appreciate your continued commitment to your health goals. Please review the care plan we discussed, and feel free to reach out if I can assist you further.  Medicare recommends these wellness visits once per year to help you and your care team stay ahead of potential health issues. These visits are designed to focus on prevention, allowing your provider to concentrate on managing your acute and chronic conditions during your regular appointments.  Please note that Annual Wellness Visits do not include a physical exam. Some assessments may be limited, especially if the visit was conducted virtually. If needed, we may recommend a separate in-person follow-up with your provider.  Ongoing Care Seeing your primary care provider every 3 to 6 months helps us  monitor your health and provide consistent, personalized care. Last office visit on 10/06/2023.    Referrals If a referral was made during today's visit and you haven't received any updates within two weeks, please contact the referred provider directly to check on the status.  Recommended Screenings:  Health Maintenance  Topic Date Due   Zoster (Shingles) Vaccine (1 of 2) Never done   Pneumococcal Vaccine for age over 7 (2 of 2 - PCV) 10/12/2010   DTaP/Tdap/Td vaccine (2 - Tdap) 09/12/2021   Flu Shot  04/12/2024   Medicare Annual Wellness Visit  05/29/2024   Colon Cancer Screening  10/05/2027   Hepatitis C Screening  Completed   HIV Screening  Completed   Hepatitis B Vaccine  Aged Out   HPV Vaccine  Aged Out   Meningitis B Vaccine  Aged Out   COVID-19 Vaccine  Discontinued       05/30/2024    3:37 PM  Advanced Directives  Does Patient Have a Medical Advance Directive? No   Advance Care Planning is important because it: Ensures you receive medical care that aligns with your values, goals, and preferences. Provides guidance to your family and loved  ones, reducing the emotional burden of decision-making during critical moments.  Vision: Annual vision screenings are recommended for early detection of glaucoma, cataracts, and diabetic retinopathy. These exams can also reveal signs of chronic conditions such as diabetes and high blood pressure.  Dental: Annual dental screenings help detect early signs of oral cancer, gum disease, and other conditions linked to overall health, including heart disease and diabetes.  Please see the attached documents for additional preventive care recommendations.

## 2024-05-30 NOTE — Progress Notes (Signed)
 Subjective:   Edward Chen. is a 63 y.o. who presents for a Medicare Wellness preventive visit.  As a reminder, Annual Wellness Visits don't include a physical exam, and some assessments may be limited, especially if this visit is performed virtually. We may recommend an in-person follow-up visit with your provider if needed.  Visit Complete: Virtual I connected with  [name]. on 05/30/24 by a audio enabled telemedicine application and verified that I am speaking with the correct person using two identifiers.  Patient Location: Home  Provider Location: Office/Clinic  I discussed the limitations of evaluation and management by telemedicine. The patient expressed understanding and agreed to proceed.  Vital Signs: Because this visit was a virtual/telehealth visit, some criteria may be missing or patient reported. Any vitals not documented were not able to be obtained and vitals that have been documented are patient reported.  VideoDeclined- This patient declined Librarian, academic. Therefore the visit was completed with audio only.  Persons Participating in Visit: Patient.  AWV Questionnaire: No: Patient Medicare AWV questionnaire was not completed prior to this visit.  Cardiac Risk Factors include: advanced age (>63men, >60 women);hypertension;Other (see comment);dyslipidemia, Risk factor comments: BPH, Asthma     Objective:    Today's Vitals   05/30/24 1521  Weight: 148 lb (67.1 kg)  Height: 5' 7 (1.702 m)  PainSc: 5    Body mass index is 23.18 kg/m.     05/30/2024    3:37 PM 05/30/2023    3:34 PM 06/26/2022    8:20 AM 09/30/2021    8:46 AM 09/14/2020    8:24 AM 04/30/2015    7:39 AM 04/28/2015   12:14 PM  Advanced Directives  Does Patient Have a Medical Advance Directive? No No No No No No  No   Would patient like information on creating a medical advance directive?  No - Patient declined  No - Patient declined No - Patient  declined No - patient declined information       Data saved with a previous flowsheet row definition    Current Medications (verified) Outpatient Encounter Medications as of 05/30/2024  Medication Sig   albuterol  (VENTOLIN  HFA) 108 (90 Base) MCG/ACT inhaler Inhale 2 puffs into the lungs every 6 (six) hours as needed for wheezing or shortness of breath.   ALPRAZolam  (XANAX ) 0.5 MG tablet Take 1 tablet (0.5 mg total) by mouth daily as needed.   aspirin EC 81 MG tablet Take 81 mg by mouth daily.   atorvastatin  (LIPITOR) 10 MG tablet Take 1 tablet (10 mg total) by mouth daily.   baclofen (LIORESAL) 10 MG tablet Take 10 mg by mouth as needed for muscle spasms.   buPROPion  (WELLBUTRIN ) 100 MG tablet 1 tablet by mouth three times a day   clotrimazole -betamethasone  (LOTRISONE ) cream APPLY 1 APPLICATION TOPICALLY DAILY   cyclobenzaprine  (FLEXERIL ) 5 MG tablet Take 1 tablet (5 mg total) by mouth 3 (three) times daily as needed.   gabapentin  (NEURONTIN ) 300 MG capsule Take 1 capsule (300 mg total) by mouth 3 (three) times daily.   meloxicam  (MOBIC ) 15 MG tablet Take 1 tablet (15 mg total) by mouth daily as needed for pain.   omeprazole  (PRILOSEC) 20 MG capsule Take 1 capsule (20 mg total) by mouth daily.   oxyCODONE -acetaminophen  (PERCOCET) 7.5-325 MG per tablet Take as directed by pain management   sildenafil  (VIAGRA ) 100 MG tablet Take one-half to one tablet by mouth daily as needed for erectile dysfunction.  telmisartan  (MICARDIS ) 80 MG tablet Take 1 tablet (80 mg total) by mouth daily.   No facility-administered encounter medications on file as of 05/30/2024.    Allergies (verified) Other, Peanuts [peanut oil], Shellfish allergy, Betadine [povidone iodine], Lexapro [escitalopram oxalate], and Nexium  [esomeprazole  magnesium ]   History: Past Medical History:  Diagnosis Date   Abnormal drug screen 09/2014   neg xanax  (but PRN use), pos EtOH   Acute asthmatic bronchitis    Allergic rhinitis     Allergy    Anxiety    with h/o attacks on xanax  for chest pain   Asthma 11/16/2015   Benign hypertension    Borderline diabetes mellitus    Cervical disc disease 11/16/2015   Chronic pain syndrome    [facility] - percocets 7.5mg    Depression    Depression with anxiety 10/29/2017   DJD (degenerative joint disease)    Erectile dysfunction 10/29/2017   Gastroparesis    GERD (gastroesophageal reflux disease)    Habitual alcohol use    Hyperlipidemia    Lumbar disc disease 11/16/2015   Palpitations    Past Surgical History:  Procedure Laterality Date   APPENDECTOMY     CERVICAL DISCECTOMY  4/06   decompression, foraminotomy, and fusion Dr. Leeann    COLONOSCOPY  11/2012   1 TA, mod diverticulosis, rpt 5 yrs Verda)   EAR CYST EXCISION Right 04/30/2015   Procedure: CYST REMOVAL Right knee;  Surgeon: Toribio JULIANNA Chancy, MD;  Location: [facility];  Service: Orthopedics;  Laterality: Right;   ESOPHAGOGASTRODUODENOSCOPY  02/2014   gastric polyps, retained gastric residue s/p dilatation Verda)   KNEE ARTHROSCOPY WITH LATERAL MENISECTOMY Right 04/30/2015   Procedure: RIGHT KNEE ARTHROSCOPY;  Surgeon: Toribio JULIANNA Chancy, MD;  Location: [facility];  Service: Orthopedics;  Laterality: Right;   KNEE ARTHROSCOPY WITH MENISCAL REPAIR Right 04/30/2015   Procedure: KNEE ARTHROSCOPY WITH OPEN MENISCAL REPAIR;  Surgeon: Toribio JULIANNA Chancy, MD;  Location: [facility];  Service: Orthopedics;  Laterality: Right;   SHOULDER SURGERY Right 11/05   Arthroscopy, debridement, distal resection   SHOULDER SURGERY Left 05/2011   Dr. Chancy   UPPER GASTROINTESTINAL ENDOSCOPY     X3   Family History  Problem Relation Age of Onset   Lung disease Father        smoker   Cancer Neg Hx    CAD Neg Hx    Stroke Neg Hx    Diabetes Neg Hx    Colon cancer Neg Hx    Esophageal cancer Neg Hx    Stomach cancer Neg Hx    Rectal cancer Neg Hx    Social History    Socioeconomic History   Marital status: Married    Spouse name: Edward Chen   Number of children: Not on file   Years of education: Not on file   Highest education level: Not on file  Occupational History   Occupation: retired on disability  Tobacco Use   Smoking status: Former    Current packs/day: 0.00    Types: Cigarettes    Start date: 10/20/2000    Quit date: 10/20/2020    Years since quitting: 3.6   Smokeless tobacco: Never   Tobacco comments:    smokes a couple of times a week  Vaping Use   Vaping status: Never Used  Substance and Sexual Activity   Alcohol use: Yes    Alcohol/week: 12.0 standard drinks of alcohol    Types: 12 Cans  of beer per week    Comment: DRINKS ON WEEKENDS   Drug use: No   Sexual activity: Not on file    Comment: smokes when drinking  Other Topics Concern   Not on file  Social History Narrative   On disability for chronic lower back pain followed by pain clinic      Lives with wife/2025   Social Drivers of Health   Financial Resource Strain: Low Risk  (05/30/2024)   Overall Financial Resource Strain (CARDIA)    Difficulty of Paying Living Expenses: Not very hard  Food Insecurity: No Food Insecurity (05/30/2024)   Hunger Vital Sign    Worried About Running Out of Food in the Last Year: Never true    Ran Out of Food in the Last Year: Never true  Transportation Needs: No Transportation Needs (05/30/2024)   PRAPARE - Administrator, Civil Service (Medical): No    Lack of Transportation (Non-Medical): No  Physical Activity: Inactive (05/30/2024)   Exercise Vital Sign    Days of Exercise per Week: 0 days    Minutes of Exercise per Session: 0 min  Stress: Stress Concern Present (05/30/2024)   [employer] of Occupational Health - Occupational Stress Questionnaire    Feeling of Stress: To some extent  Social Connections: Socially Integrated (05/30/2024)   Social Connection and Isolation Panel    Frequency of Communication with  Friends and Family: More than three times a week    Frequency of Social Gatherings with Friends and Family: Never    Attends Religious Services: More than 4 times per year    Active Member of [employer] or Organizations: Yes    Attends Engineer, structural: More than 4 times per year    Marital Status: Married    Tobacco Counseling Counseling given: Not Answered Tobacco comments: smokes a couple of times a week    Clinical Intake:  Pre-visit preparation completed: Yes  Pain : 0-10 Pain Score: 5  Pain Type: Chronic pain Pain Location: Back Pain Orientation:  (neck, knees and feet) Pain Descriptors / Indicators: Aching, Discomfort Pain Onset: More than a month ago Pain Frequency: Constant Pain Relieving Factors: goes to pain clinic/Oxycodone   Pain Relieving Factors: goes to pain clinic/Oxycodone   Nutritional Risks: None Diabetes: No  Lab Results  Component Value Date   HGBA1C 5.3 10/06/2023   HGBA1C 5.2 04/05/2023   HGBA1C 4.9 10/05/2022     How often do you need to have someone help you when you read instructions, pamphlets, or other written materials from your doctor or pharmacy?: 4 - Often (wife or neighbor) What is the last grade level you completed in school?: 8th  Interpreter Needed?: No  Information entered by :: Keygan Dumond, RMA   Activities of Daily Living     05/30/2024    3:34 PM  In your present state of health, do you have any difficulty performing the following activities:  Hearing? 0  Vision? 0  Difficulty concentrating or making decisions? 0  Walking or climbing stairs? 0  Dressing or bathing? 0  Doing errands, shopping? 0  Comment drives some  Preparing Food and eating ? N  Using the Toilet? N  In the past six months, have you accidently leaked urine? N  Do you have problems with loss of bowel control? N  Managing your Medications? N  Managing your Finances? N  Housekeeping or managing your Housekeeping? N    Patient Care  Team: Norleen Lynwood ORN, MD as  PCP - General (Internal Medicine)  I have updated your Care Teams any recent Medical Services you may have received from other providers in the past year.     Assessment:   This is a routine wellness examination for Brach.  Hearing/Vision screen Hearing Screening - Comments:: Denies hearing difficulties   Vision Screening - Comments:: Denies vision issues.    Goals Addressed             This Visit's Progress    Patient Stated   On track    To maintain my health and stay independent.       Depression Screen     05/30/2024    3:40 PM 10/06/2023    8:51 AM 05/30/2023    3:36 PM 04/05/2023    9:40 AM 10/05/2022    9:11 AM 10/05/2022    8:43 AM 04/04/2022    8:04 AM  PHQ 2/9 Scores  PHQ - 2 Score 0 0 0 0 0 0 2  PHQ- 9 Score 3  0   0 6    Fall Risk     05/30/2024    3:37 PM 10/06/2023    8:51 AM 05/30/2023    3:36 PM 04/05/2023    9:39 AM 10/05/2022    9:11 AM  Fall Risk   Falls in the past year? 0 0 0 0 0  Number falls in past yr: 0 0 0 0 0  Injury with Fall? 0 0 0 0 0  Risk for fall due to :  No Fall Risks No Fall Risks No Fall Risks   Follow up Falls evaluation completed;Falls prevention discussed Falls evaluation completed Falls prevention discussed Falls evaluation completed     MEDICARE RISK AT HOME:  Medicare Risk at Home Any stairs in or around the home?: No If so, are there any without handrails?: No Home free of loose throw rugs in walkways, pet beds, electrical cords, etc?: Yes Adequate lighting in your home to reduce risk of falls?: Yes Life alert?: No Use of a cane, walker or w/c?: Yes (uses a cane) Grab bars in the bathroom?: No Shower chair or bench in shower?: Yes Elevated toilet seat or a handicapped toilet?: Yes  TIMED UP AND GO:  Was the test performed?  No  Cognitive Function: Declined/Normal: No cognitive concerns noted by patient or family. Patient alert, oriented, able to answer questions appropriately and recall  recent events. No signs of memory loss or confusion.        05/30/2023    3:36 PM  6CIT Screen  What Year? 0 points  What month? 0 points  What time? 0 points  Count back from 20 0 points  Months in reverse 0 points  Repeat phrase 0 points  Total Score 0 points    Immunizations Immunization History  Administered Date(s) Administered   Influenza Whole 06/12/2004   PFIZER(Purple Top)SARS-COV-2 Vaccination 12/30/2019, 01/20/2020   Pneumococcal Polysaccharide-23 10/12/2009   Td 09/13/2011    Screening Tests Health Maintenance  Topic Date Due   Zoster Vaccines- Shingrix (1 of 2) Never done   Pneumococcal Vaccine: 50+ Years (2 of 2 - PCV) 10/12/2010   DTaP/Tdap/Td (2 - Tdap) 09/12/2021   Influenza Vaccine  04/12/2024   Medicare Annual Wellness (AWV)  05/29/2024   Colonoscopy  10/05/2027   Hepatitis C Screening  Completed   HIV Screening  Completed   Hepatitis B Vaccines 19-59 Average Risk  Aged Out   HPV VACCINES  Aged Out  Meningococcal B Vaccine  Aged Out   COVID-19 Vaccine  Discontinued    Health Maintenance Items Addressed: See Nurse Notes at the end of this note  Additional Screening:  Vision Screening: Recommended annual ophthalmology exams for early detection of glaucoma and other disorders of the eye. Is the patient up to date with their annual eye exam?  No  Who is the provider or what is the name of the office in which the patient attends annual eye exams? Patient has no eye doctor  Dental Screening: Recommended annual dental exams for proper oral hygiene  Community Resource Referral / Chronic Care Management: CRR required this visit?  No   CCM required this visit?  No   Plan:    I have personally reviewed and noted the following in the patient's chart:   Medical and social history Use of alcohol, tobacco or illicit drugs  Current medications and supplements including opioid prescriptions. Patient is currently taking opioid prescriptions.  Information provided to patient regarding non-opioid alternatives. Patient advised to discuss non-opioid treatment plan with their provider. Functional ability and status Nutritional status Physical activity Advanced directives List of other physicians Hospitalizations, surgeries, and ER visits in previous 12 months Vitals Screenings to include cognitive, depression, and falls Referrals and appointments  In addition, I have reviewed and discussed with patient certain preventive protocols, quality metrics, and best practice recommendations. A written personalized care plan for preventive services as well as general preventive health recommendations were provided to patient.   Advay Volante L Layaan Mott, CMA   05/30/2024   After Visit Summary: (Mail) Due to this being a telephonic visit, the after visit summary with patients personalized plan was offered to patient via mail   Notes: Patient declines all vaccines at this time.  He had no other concerns to address today.

## 2024-06-05 DIAGNOSIS — K5900 Constipation, unspecified: Secondary | ICD-10-CM | POA: Diagnosis not present

## 2024-06-05 DIAGNOSIS — G894 Chronic pain syndrome: Secondary | ICD-10-CM | POA: Diagnosis not present

## 2024-06-05 DIAGNOSIS — F329 Major depressive disorder, single episode, unspecified: Secondary | ICD-10-CM | POA: Diagnosis not present

## 2024-06-05 DIAGNOSIS — R0789 Other chest pain: Secondary | ICD-10-CM | POA: Diagnosis not present

## 2024-07-10 DIAGNOSIS — M65871 Other synovitis and tenosynovitis, right ankle and foot: Secondary | ICD-10-CM | POA: Diagnosis not present

## 2024-07-29 ENCOUNTER — Telehealth: Payer: Self-pay

## 2024-07-29 DIAGNOSIS — K3184 Gastroparesis: Secondary | ICD-10-CM

## 2024-07-29 DIAGNOSIS — R1310 Dysphagia, unspecified: Secondary | ICD-10-CM

## 2024-07-29 DIAGNOSIS — K219 Gastro-esophageal reflux disease without esophagitis: Secondary | ICD-10-CM

## 2024-07-29 NOTE — Telephone Encounter (Signed)
 Copied from CRM #[id number]. Topic: Referral - Request for Referral >> Jul 29, 2024 12:05 PM Anairis L wrote: Did the patient discuss referral with their provider in the last year? Yes (If No - schedule appointment) (If Yes - send message)  Appointment offered? No  Type of order/referral and detailed reason for visit: GI/Dr. Abran   Preference of office, provider, location: Dr.Perry [facility]  If referral order, have you been seen by this specialty before? Yes (If Yes, this issue or another issue? When? Where?  Can we respond through MyChart? Yes

## 2024-07-29 NOTE — Telephone Encounter (Signed)
Ok this referral is done, thanks

## 2024-07-29 NOTE — Addendum Note (Signed)
 Addended by: NORLEEN LYNWOOD ORN on: 07/29/2024 03:27 PM   Modules accepted: Orders

## 2024-07-31 ENCOUNTER — Encounter: Payer: Self-pay | Admitting: Internal Medicine

## 2024-07-31 DIAGNOSIS — R0789 Other chest pain: Secondary | ICD-10-CM | POA: Diagnosis not present

## 2024-07-31 DIAGNOSIS — G894 Chronic pain syndrome: Secondary | ICD-10-CM | POA: Diagnosis not present

## 2024-07-31 DIAGNOSIS — K5900 Constipation, unspecified: Secondary | ICD-10-CM | POA: Diagnosis not present

## 2024-07-31 DIAGNOSIS — F329 Major depressive disorder, single episode, unspecified: Secondary | ICD-10-CM | POA: Diagnosis not present

## 2024-08-07 ENCOUNTER — Encounter: Payer: Self-pay | Admitting: Pharmacist

## 2024-08-07 NOTE — Progress Notes (Signed)
 Pharmacy Quality Measure Review  This patient is appearing on a report for being at risk of failing the adherence measure for cholesterol (statin) medications this calendar year.   Medication: Atorvastatin  Last fill date: 07/21/24 for 90 day supply  Insurance report was not up to date. No action needed at this time.   Darrelyn Drum, PharmD, BCPS, CPP Clinical Pharmacist Practitioner [facility] [phone]

## 2024-08-09 ENCOUNTER — Other Ambulatory Visit: Payer: Self-pay | Admitting: Internal Medicine

## 2024-08-12 ENCOUNTER — Ambulatory Visit: Payer: Self-pay

## 2024-08-12 NOTE — Telephone Encounter (Signed)
 FYI Only or Action Required?: FYI only for provider: appointment scheduled on 12/2.  Patient was last seen in primary care on 10/06/2023 by Edward Lynwood ORN, Edward Chen.  Called Nurse Triage reporting Toe Pain.  Symptoms began several weeks ago.  Interventions attempted: OTC medications: tyl;enol, ibuprofen.  Symptoms are: gradually worsening.  Triage Disposition: See Physician Within 24 Hours  Patient/caregiver understands and will follow disposition?: Yes  Copied from CRM #[id number]. Topic: Clinical - Red Word Triage >> Aug 12, 2024 10:45 AM Edward Chen wrote: [facility] that prompted transfer to Nurse Triage: broken/jammed toe -  bottom.SABRA swollen and in pain. Reason for Disposition  [1] Swollen toe AND [2] no fever  (Exceptions: Just a localized bump from bunion, corns, insect bite, sting.)  Answer Assessment - Initial Assessment Questions Right big toe slightly swollen and red. Irritated with socks, shoes. OTC pain meds not helping. Feels like there might be a knot under but to touch there is nothing there. Denies temp change to the toe, fever, red streaks, CP, SOB, or dizziness. Appt with PCP 12/2 to assess. ED/UC  precautions advised. Patient wondering if he can get his lab work run during the appt as well. Advised to discuss with PCP.   1. ONSET: When did the pain start?      A couple weeks ago 2. LOCATION: Where is the pain located?   (e.Chen., around nail, entire toe, at foot joint)      Right big toe 3. PAIN: How bad is the pain?    (Scale 1-10; or mild, moderate, severe)     Sore- tylenol /ibuprofen 4. APPEARANCE: What does the toe look like? (e.Chen., redness, swelling, bruising, pallor)     Red and slightly swollen  5. CAUSE: What do you think is causing the toe pain?     unsure 6. OTHER SYMPTOMS: Do you have any other symptoms? (e.Chen., leg pain, rash, fever, numbness)     denies  Protocols used: Toe Pain-A-AH

## 2024-08-13 ENCOUNTER — Ambulatory Visit: Payer: Self-pay | Admitting: Internal Medicine

## 2024-08-13 ENCOUNTER — Encounter: Payer: Self-pay | Admitting: Internal Medicine

## 2024-08-13 ENCOUNTER — Telehealth: Payer: Self-pay

## 2024-08-13 ENCOUNTER — Telehealth: Payer: Self-pay | Admitting: Internal Medicine

## 2024-08-13 ENCOUNTER — Ambulatory Visit: Admitting: Internal Medicine

## 2024-08-13 VITALS — BP 128/76 | HR 68 | Temp 98.0°F | Ht 67.0 in | Wt 146.0 lb

## 2024-08-13 DIAGNOSIS — E559 Vitamin D deficiency, unspecified: Secondary | ICD-10-CM | POA: Diagnosis not present

## 2024-08-13 DIAGNOSIS — M79671 Pain in right foot: Secondary | ICD-10-CM | POA: Insufficient documentation

## 2024-08-13 DIAGNOSIS — E538 Deficiency of other specified B group vitamins: Secondary | ICD-10-CM | POA: Diagnosis not present

## 2024-08-13 DIAGNOSIS — R739 Hyperglycemia, unspecified: Secondary | ICD-10-CM

## 2024-08-13 DIAGNOSIS — J019 Acute sinusitis, unspecified: Secondary | ICD-10-CM | POA: Diagnosis not present

## 2024-08-13 DIAGNOSIS — I10 Essential (primary) hypertension: Secondary | ICD-10-CM

## 2024-08-13 DIAGNOSIS — E785 Hyperlipidemia, unspecified: Secondary | ICD-10-CM | POA: Diagnosis not present

## 2024-08-13 DIAGNOSIS — R972 Elevated prostate specific antigen [PSA]: Secondary | ICD-10-CM

## 2024-08-13 LAB — URINALYSIS, ROUTINE W REFLEX MICROSCOPIC
Hgb urine dipstick: NEGATIVE
Ketones, ur: NEGATIVE
Leukocytes,Ua: NEGATIVE
Nitrite: NEGATIVE
Specific Gravity, Urine: 1.02 (ref 1.000–1.030)
Total Protein, Urine: NEGATIVE
Urine Glucose: NEGATIVE
Urobilinogen, UA: 0.2 (ref 0.0–1.0)
pH: 6 (ref 5.0–8.0)

## 2024-08-13 LAB — HEPATIC FUNCTION PANEL
ALT: 11 U/L (ref 0–53)
AST: 14 U/L (ref 0–37)
Albumin: 4.9 g/dL (ref 3.5–5.2)
Alkaline Phosphatase: 65 U/L (ref 39–117)
Bilirubin, Direct: 0.2 mg/dL (ref 0.0–0.3)
Total Bilirubin: 1.2 mg/dL (ref 0.2–1.2)
Total Protein: 7.4 g/dL (ref 6.0–8.3)

## 2024-08-13 LAB — CBC WITH DIFFERENTIAL/PLATELET
Basophils Absolute: 0 K/uL (ref 0.0–0.1)
Basophils Relative: 0.5 % (ref 0.0–3.0)
Eosinophils Absolute: 0.1 K/uL (ref 0.0–0.7)
Eosinophils Relative: 1.2 % (ref 0.0–5.0)
HCT: 43.6 % (ref 39.0–52.0)
Hemoglobin: 15 g/dL (ref 13.0–17.0)
Lymphocytes Relative: 22.8 % (ref 12.0–46.0)
Lymphs Abs: 1.6 K/uL (ref 0.7–4.0)
MCHC: 34.5 g/dL (ref 30.0–36.0)
MCV: 92.7 fl (ref 78.0–100.0)
Monocytes Absolute: 0.5 K/uL (ref 0.1–1.0)
Monocytes Relative: 6.9 % (ref 3.0–12.0)
Neutro Abs: 4.8 K/uL (ref 1.4–7.7)
Neutrophils Relative %: 68.6 % (ref 43.0–77.0)
Platelets: 173 K/uL (ref 150.0–400.0)
RBC: 4.7 Mil/uL (ref 4.22–5.81)
RDW: 12.5 % (ref 11.5–15.5)
WBC: 7 K/uL (ref 4.0–10.5)

## 2024-08-13 LAB — LIPID PANEL
Cholesterol: 185 mg/dL (ref 0–200)
HDL: 62.8 mg/dL (ref >39.00)
LDL Cholesterol: 102 mg/dL — ABNORMAL HIGH (ref 0–99)
NonHDL: 122.3
Total CHOL/HDL Ratio: 3
Triglycerides: 101 mg/dL (ref 0.0–149.0)
VLDL: 20.2 mg/dL (ref 0.0–40.0)

## 2024-08-13 LAB — PSA: PSA: 0.8 ng/mL (ref 0.10–4.00)

## 2024-08-13 LAB — BASIC METABOLIC PANEL WITH GFR
BUN: 15 mg/dL (ref 6–23)
CO2: 32 meq/L (ref 19–32)
Calcium: 9.9 mg/dL (ref 8.4–10.5)
Chloride: 100 meq/L (ref 96–112)
Creatinine, Ser: 1.08 mg/dL (ref 0.40–1.50)
GFR: 71.99 mL/min (ref >60.00)
Glucose, Bld: 109 mg/dL — ABNORMAL HIGH (ref 70–99)
Potassium: 4.7 meq/L (ref 3.5–5.1)
Sodium: 139 meq/L (ref 135–145)

## 2024-08-13 LAB — VITAMIN D 25 HYDROXY (VIT D DEFICIENCY, FRACTURES): VITD: 40.62 ng/mL (ref 30.00–100.00)

## 2024-08-13 LAB — VITAMIN B12: Vitamin B-12: 899 pg/mL (ref 211–911)

## 2024-08-13 LAB — HEMOGLOBIN A1C: Hgb A1c MFr Bld: 4.9 % (ref 4.6–6.5)

## 2024-08-13 LAB — TSH: TSH: 1.51 u[IU]/mL (ref 0.35–5.50)

## 2024-08-13 MED ORDER — TELMISARTAN 80 MG PO TABS: 80.0000 mg | ORAL_TABLET | Freq: Every day | ORAL | 3 refills | Status: AC

## 2024-08-13 MED ORDER — PREDNISONE 10 MG PO TABS: ORAL_TABLET | ORAL | 0 refills | Status: DC

## 2024-08-13 MED ORDER — SILDENAFIL CITRATE 100 MG PO TABS: ORAL_TABLET | ORAL | 4 refills | Status: AC

## 2024-08-13 MED ORDER — OMEPRAZOLE 20 MG PO CPDR: 20.0000 mg | DELAYED_RELEASE_CAPSULE | Freq: Every day | ORAL | 3 refills | Status: AC

## 2024-08-13 MED ORDER — ATORVASTATIN CALCIUM 10 MG PO TABS: 10.0000 mg | ORAL_TABLET | Freq: Every day | ORAL | 3 refills | Status: AC

## 2024-08-13 MED ORDER — AZITHROMYCIN 250 MG PO TABS: ORAL_TABLET | ORAL | 1 refills | Status: AC

## 2024-08-13 NOTE — Telephone Encounter (Signed)
 Copied from CRM [id number]. Topic: General - Other >> Aug 13, 2024 12:10 PM Antony S wrote: Reason for CRM: wanting lab results sent to him in the mail

## 2024-08-13 NOTE — Assessment & Plan Note (Signed)
 Mild to mod, can't r/o acute gout, for prednisone taper,  to f/u any worsening symptoms or concerns

## 2024-08-13 NOTE — Assessment & Plan Note (Signed)
 BP Readings from Last 3 Encounters:  08/13/24 128/76  10/06/23 120/78  04/05/23 130/82   Stable, pt to continue medical treatment micardis  80 mg qd

## 2024-08-13 NOTE — Assessment & Plan Note (Signed)
Also for f/u psa 

## 2024-08-13 NOTE — Progress Notes (Signed)
 Patient ID: Edward VEAR Mardy Mickey., male   DOB: December 30, 1960, 63 y.o.   MRN: [id number]        Chief Complaint: follow up low vit d, right foot pain, htn, sinusitis, hld       HPI:  Edward Chiriboga. is a 63 y.o. male  Here with 2-3 days acute onset fever, facial pain, pressure, headache, general weakness and malaise, and greenish d/c, with mild ST and cough, but pt denies chest pain, wheezing, increased sob or doe, orthopnea, PND, increased LE swelling, palpitations, dizziness or syncope.  Also has new onset swelling pain redness to right foot first MTP without ulcer, fever, trauma.   Pt denies polydipsia, polyuria, or new focal neuro s/s.          Wt Readings from Last 3 Encounters:  08/13/24 146 lb (66.2 kg)  05/30/24 148 lb (67.1 kg)  10/06/23 148 lb (67.1 kg)   BP Readings from Last 3 Encounters:  08/13/24 128/76  10/06/23 120/78  04/05/23 130/82         Past Medical History:  Diagnosis Date   Abnormal drug screen 09/2014   neg xanax  (but PRN use), pos EtOH   Acute asthmatic bronchitis    Allergic rhinitis    Allergy    Anxiety    with h/o attacks on xanax  for chest pain   Asthma 11/16/2015   Benign hypertension    Borderline diabetes mellitus    Cervical disc disease 11/16/2015   Chronic pain syndrome    [facility] - percocets 7.5mg    Depression    Depression with anxiety 10/29/2017   DJD (degenerative joint disease)    Erectile dysfunction 10/29/2017   Gastroparesis    GERD (gastroesophageal reflux disease)    Habitual alcohol use    Hyperlipidemia    Lumbar disc disease 11/16/2015   Palpitations    Past Surgical History:  Procedure Laterality Date   APPENDECTOMY     CERVICAL DISCECTOMY  4/06   decompression, foraminotomy, and fusion Dr. Leeann    COLONOSCOPY  11/2012   1 TA, mod diverticulosis, rpt 5 yrs Verda)   EAR CYST EXCISION Right 04/30/2015   Procedure: CYST REMOVAL Right knee;  Surgeon: Toribio JULIANNA Chancy, MD;  Location: [facility];   Service: Orthopedics;  Laterality: Right;   ESOPHAGOGASTRODUODENOSCOPY  02/2014   gastric polyps, retained gastric residue s/p dilatation Verda)   KNEE ARTHROSCOPY WITH LATERAL MENISECTOMY Right 04/30/2015   Procedure: RIGHT KNEE ARTHROSCOPY;  Surgeon: Toribio JULIANNA Chancy, MD;  Location: [facility];  Service: Orthopedics;  Laterality: Right;   KNEE ARTHROSCOPY WITH MENISCAL REPAIR Right 04/30/2015   Procedure: KNEE ARTHROSCOPY WITH OPEN MENISCAL REPAIR;  Surgeon: Toribio JULIANNA Chancy, MD;  Location: [facility];  Service: Orthopedics;  Laterality: Right;   SHOULDER SURGERY Right 11/05   Arthroscopy, debridement, distal resection   SHOULDER SURGERY Left 05/2011   Dr. Chancy   UPPER GASTROINTESTINAL ENDOSCOPY     X3    reports that he quit smoking about 3 years ago. His smoking use included cigarettes. He started smoking about 23 years ago. He has never used smokeless tobacco. He reports current alcohol use of about 12.0 standard drinks of alcohol per week. He reports that he does not use drugs. family history includes Lung disease in his father. Allergies  Allergen Reactions   Other Anaphylaxis    BLACKBERRIES   Peanuts [Peanut Oil] Anaphylaxis   Shellfish Allergy Anaphylaxis   Betadine [  Povidone Iodine] Other (See Comments)    Due to shellfish allergy    Lexapro [Escitalopram Oxalate] Other (See Comments)   Nexium  [Esomeprazole  Magnesium ] Other (See Comments)    Chest pain, does ok with prilosec   Povidone-Iodine Other (See Comments)    povidone-iodine   Current Outpatient Medications on File Prior to Visit  Medication Sig Dispense Refill   albuterol  (VENTOLIN  HFA) 108 (90 Base) MCG/ACT inhaler Inhale 2 puffs into the lungs every 6 (six) hours as needed for wheezing or shortness of breath. 1 each 11   ALPRAZolam  (XANAX ) 0.5 MG tablet TAKE 1 TABLET BY MOUTH DAILY AS NEEDED. 90 tablet 1   aspirin EC 81 MG tablet Take 81 mg by mouth daily.     baclofen (LIORESAL)  10 MG tablet Take 10 mg by mouth as needed for muscle spasms.     buPROPion  (WELLBUTRIN ) 100 MG tablet 1 tablet by mouth three times a day     clotrimazole -betamethasone  (LOTRISONE ) cream APPLY 1 APPLICATION TOPICALLY DAILY 30 g 0   cyclobenzaprine  (FLEXERIL ) 5 MG tablet Take 1 tablet (5 mg total) by mouth 3 (three) times daily as needed. 60 tablet 1   gabapentin  (NEURONTIN ) 300 MG capsule Take 1 capsule (300 mg total) by mouth 3 (three) times daily. 270 capsule 1   meloxicam  (MOBIC ) 15 MG tablet Take 1 tablet (15 mg total) by mouth daily as needed for pain. 90 tablet 3   oxyCODONE -acetaminophen  (PERCOCET) 7.5-325 MG per tablet Take as directed by pain management     No current facility-administered medications on file prior to visit.        ROS:  All others reviewed and negative.  Objective        PE:  BP 128/76 (BP Location: Right Arm, Patient Position: Sitting, Cuff Size: Normal)   Pulse 68   Temp 98 F (36.7 C) (Oral)   Ht 5' 7 (1.702 m)   Wt 146 lb (66.2 kg)   SpO2 99%   BMI 22.87 kg/m                 Constitutional: Pt appears mild ill               HENT: Head: NCAT.                Right Ear: External ear normal.                 Left Ear: External ear normal. Bilat tm's with mild erythema.  Max sinus areas mild tender.  Pharynx with mild erythema, no exudate               Eyes: . Pupils are equal, round, and reactive to light. Conjunctivae and EOM are normal               Nose: without d/c or deformity               Neck: Neck supple. Gross normal ROM               Cardiovascular: Normal rate and regular rhythm.                 Pulmonary/Chest: Effort normal and breath sounds without rales or wheezing.                Right foot mtp with 1-2+ sweling, redness, tender               Neurological: Pt is alert. At baseline orientation,  motor grossly intact               Skin: Skin is warm. No rashes, no other new lesions, LE edema - none               Psychiatric: Pt behavior is  normal without agitation   Micro: none  Cardiac tracings I have personally interpreted today:  none  Pertinent Radiological findings (summarize): none   Lab Results  Component Value Date   WBC 7.0 08/13/2024   HGB 15.0 08/13/2024   HCT 43.6 08/13/2024   PLT 173.0 08/13/2024   GLUCOSE 109 (H) 08/13/2024   CHOL 185 08/13/2024   TRIG 101.0 08/13/2024   HDL 62.80 08/13/2024   LDLDIRECT 75.0 10/05/2022   LDLCALC 102 (H) 08/13/2024   ALT 11 08/13/2024   AST 14 08/13/2024   NA 139 08/13/2024   K 4.7 08/13/2024   CL 100 08/13/2024   CREATININE 1.08 08/13/2024   BUN 15 08/13/2024   CO2 32 08/13/2024   TSH 1.51 08/13/2024   PSA 0.80 08/13/2024   HGBA1C 4.9 08/13/2024   Assessment/Plan:  Edward Hawthorne. is a 63 y.o. White or Caucasian [1] male with  has a past medical history of Abnormal drug screen (09/2014), Acute asthmatic bronchitis, Allergic rhinitis, Allergy, Anxiety, Asthma (11/16/2015), Benign hypertension, Borderline diabetes mellitus, Cervical disc disease (11/16/2015), Chronic pain syndrome, Depression, Depression with anxiety (10/29/2017), DJD (degenerative joint disease), Erectile dysfunction (10/29/2017), Gastroparesis, GERD (gastroesophageal reflux disease), Habitual alcohol use, Hyperlipidemia, Lumbar disc disease (11/16/2015), and Palpitations.  Vitamin D  deficiency Last vitamin D  Lab Results  Component Value Date   VD25OH 40.62 08/13/2024   Stable, cont oral replacement   Right foot pain Mild to mod, can't r/o acute gout, for prednisone taper,  to f/u any worsening symptoms or concerns  HYPERTENSION, BENIGN BP Readings from Last 3 Encounters:  08/13/24 128/76  10/06/23 120/78  04/05/23 130/82   Stable, pt to continue medical treatment micardis  80 mg qd   Hyperglycemia Lab Results  Component Value Date   HGBA1C 4.9 08/13/2024   Stable, pt to continue current medical treatment  - diet, wt control   Dyslipidemia Lab Results  Component Value Date    LDLCALC 102 (H) 08/13/2024   Mild uncontrolled, for lower chol diet and lipitor 10 mg every day, declines change today   B12 deficiency Lab Results  Component Value Date   VITAMINB12 899 08/13/2024   Stable, cont oral replacement - b12 1000 mcg qd   Elevated PSA Also for f/u psa   Acute sinus infection Mild to mod, for antibx course zpack,  to f/u any worsening symptoms or concerns  Followup: Return in about 6 months (around 02/11/2025).  Edward Norleen, MD 08/13/2024 9:25 PM [facility]  Primary Care - [facility]

## 2024-08-13 NOTE — Assessment & Plan Note (Signed)
 Lab Results  Component Value Date   LDLCALC 102 (H) 08/13/2024   Mild uncontrolled, for lower chol diet and lipitor 10 mg every day, declines change today

## 2024-08-13 NOTE — Patient Instructions (Signed)
 Please take all new medication as prescribed - the antibiotic, and prednisone  Please continue all other medications as before, and refills have been done if requested.  Please have the pharmacy call with any other refills you may need.  Please continue your efforts at being more active, low cholesterol diet, and weight control.  Please keep your appointments with your specialists as you may have planned  Please go to the LAB at the blood drawing area for the tests to be done  You will be contacted by phone if any changes need to be made immediately.  Otherwise, you will receive a letter about your results with an explanation, but please check with MyChart first.  Please make an Appointment to return in 6 months, or sooner if needed

## 2024-08-13 NOTE — Telephone Encounter (Unsigned)
 Copied from CRM [id number]. Topic: Clinical - Medication Refill >> Aug 13, 2024  3:32 PM Mercedes G wrote: Medication:  sildenafil  (VIAGRA ) 100 MG tablet   Has the patient contacted their pharmacy? Yes (Agent: If no, request that the patient contact the pharmacy for the refill. If patient does not wish to contact the pharmacy document the reason why and proceed with request.) (Agent: If yes, when and what did the pharmacy advise?)  This is the patient's preferred pharmacy:   [facility] - [place], [place] - [address] [place] [address] Phone: [phone] Fax: [phone]  Is this the correct pharmacy for this prescription? Yes If no, delete pharmacy and type the correct one.   Has the prescription been filled recently? Yes  Is the patient out of the medication? Yes  Has the patient been seen for an appointment in the last year OR does the patient have an upcoming appointment? Yes  Can we respond through MyChart? Yes  Agent: Please be advised that Rx refills may take up to 3 business days. We ask that you follow-up with your pharmacy.

## 2024-08-13 NOTE — Assessment & Plan Note (Signed)
Mild to mod, for antibx course zpack,  to f/u any worsening symptoms or concerns 

## 2024-08-13 NOTE — Assessment & Plan Note (Signed)
 Lab Results  Component Value Date   VITAMINB12 899 08/13/2024   Stable, cont oral replacement - b12 1000 mcg qd

## 2024-08-13 NOTE — Assessment & Plan Note (Signed)
 Last vitamin D  Lab Results  Component Value Date   VD25OH 40.62 08/13/2024   Stable, cont oral replacement

## 2024-08-13 NOTE — Assessment & Plan Note (Signed)
 Lab Results  Component Value Date   HGBA1C 4.9 08/13/2024   Stable, pt to continue current medical treatment  - diet, wt control

## 2024-08-15 NOTE — Telephone Encounter (Signed)
 Labs have been mailed out to Pt on 08/13/24.

## 2024-08-20 ENCOUNTER — Encounter: Payer: Self-pay | Admitting: Pharmacist

## 2024-08-20 NOTE — Progress Notes (Signed)
 Pharmacy Quality Measure Review  This patient is appearing on a report for being at risk of failing the adherence measure for cholesterol (statin) medications this calendar year.   Medication: Atorvastatin  Last fill date: 07/18/24 for 90 day supply  Insurance report was not up to date. No action needed at this time.   Darrelyn Drum, PharmD, BCPS, CPP Clinical Pharmacist Practitioner [facility] at [facility] [phone]

## 2024-10-03 ENCOUNTER — Ambulatory Visit: Admitting: Internal Medicine

## 2024-10-03 ENCOUNTER — Encounter: Payer: Self-pay | Admitting: Internal Medicine

## 2024-10-03 VITALS — BP 128/72 | HR 76 | Ht 67.0 in | Wt 146.0 lb

## 2024-10-03 DIAGNOSIS — R1013 Epigastric pain: Secondary | ICD-10-CM

## 2024-10-03 DIAGNOSIS — Z79891 Long term (current) use of opiate analgesic: Secondary | ICD-10-CM | POA: Diagnosis not present

## 2024-10-03 DIAGNOSIS — R140 Abdominal distension (gaseous): Secondary | ICD-10-CM

## 2024-10-03 DIAGNOSIS — K3184 Gastroparesis: Secondary | ICD-10-CM

## 2024-10-03 DIAGNOSIS — G8929 Other chronic pain: Secondary | ICD-10-CM

## 2024-10-03 DIAGNOSIS — Z860101 Personal history of adenomatous and serrated colon polyps: Secondary | ICD-10-CM | POA: Diagnosis not present

## 2024-10-03 DIAGNOSIS — K219 Gastro-esophageal reflux disease without esophagitis: Secondary | ICD-10-CM

## 2024-10-03 DIAGNOSIS — Z860100 Personal history of colon polyps, unspecified: Secondary | ICD-10-CM

## 2024-10-03 NOTE — Patient Instructions (Signed)
 You will be contacted by [facility] in the next 2 days to arrange an ultrasound.  The number on your caller ID will be [phone], please answer when they call.  If you have not heard from them in 2 days please call [phone] to schedule.    You have been scheduled for an endoscopy. Please follow written instructions given to you at your visit today.  If you use inhalers (even only as needed), please bring them with you on the day of your procedure.  If you take any of the following medications, they will need to be adjusted prior to your procedure:   DO NOT TAKE 7 DAYS PRIOR TO TEST- Trulicity (dulaglutide) Ozempic, Wegovy (semaglutide) Mounjaro, Zepbound (tirzepatide) Bydureon Bcise (exanatide extended release)  DO NOT TAKE 1 DAY PRIOR TO YOUR TEST Rybelsus (semaglutide) Adlyxin (lixisenatide) Victoza (liraglutide) Byetta (exanatide) ___________________________________________________________________________  _______________________________________________________  If your blood pressure at your visit was 140/90 or greater, please contact your primary care physician to follow up on this.  _______________________________________________________  If you are age 66 or older, your body mass index should be between 23-30. Your Body mass index is 22.87 kg/m. If this is out of the aforementioned range listed, please consider follow up with your Primary Care Provider.  If you are age 66 or younger, your body mass index should be between 19-25. Your Body mass index is 22.87 kg/m. If this is out of the aformentioned range listed, please consider follow up with your Primary Care Provider.   ________________________________________________________  The [facility] providers would like to encourage you to use MYCHART to communicate with providers for non-urgent requests or questions.  Due to long hold times on the telephone, sending your provider a message by [facility]  may be a faster and more efficient way to get a response.  Please allow 48 business hours for a response.  Please remember that this is for non-urgent requests.  _______________________________________________________  [facility] is using a team-based approach to care.  Your team is made up of your doctor and two to three APPS. Our APPS (Nurse Practitioners and Physician Assistants) work with your physician to ensure care continuity for you. They are fully qualified to address your health concerns and develop a treatment plan. They communicate directly with your gastroenterologist to care for you. Seeing the Advanced Practice Practitioners on your physician's team can help you by facilitating care more promptly, often allowing for earlier appointments, access to diagnostic testing, procedures, and other specialty referrals.

## 2024-10-03 NOTE — Progress Notes (Signed)
 HISTORY OF PRESENT ILLNESS:  Edward Chen. is a 64 y.o. male with multiple medical problems as listed below including chronic pain syndrome on narcotics, asthmatic bronchitis, hypertension, and anxiety/depression.  He presents today with a chief complaint of postprandial epigastric pain and bloating.  Patient was seen in this office August 08, 2017 regarding GERD with mild intermittent solid food dysphagia, epigastric discomfort, and mild gastroparesis.  He also has a history of adenomatous colon polyps.  He underwent colonoscopy and upper endoscopy October 04, 2017 colonoscopy revealed a non-adenomatous colon polyp.  Otherwise normal.  Follow-up in 10 years recommended.  Upper endoscopy revealed a distal esophageal stricture with dilated with 54 French Maloney dilator.  Exam was otherwise unremarkable.  No cause for his chronic epigastric pain found.  A CT of the abdomen and pelvis was ordered, but canceled.  He has not been seen since.  Patient tells me that he continues with postprandial epigastric pain with bloating.  May occur hours after eating.  He continues to take oxycodone  daily.  He did have significant weight loss after his divorce, but tells me his weight has been stable over the past 2 years.  This is confirmed when reviewing the medical record.  He continues on omeprazole  20 mg daily for GERD.  He denies any significant dysphagia at this time.  He is worried that maybe his gallbladder.  Review of blood work from December 2025 reveals unremarkable comprehensive metabolic panel, CBC, and lipid panel.  Hemoglobin 15.0.  Hemoglobin A1c 4.9.   REVIEW OF SYSTEMS:  All non-GI ROS negative except for  Past Medical History:  Diagnosis Date   Abnormal drug screen 09/2014   neg xanax  (but PRN use), pos EtOH   Acute asthmatic bronchitis    Allergic rhinitis    Allergy    Anxiety    with h/o attacks on xanax  for chest pain   Asthma 11/16/2015   Benign hypertension    Borderline  diabetes mellitus    Cervical disc disease 11/16/2015   Chronic pain syndrome    [facility] - percocets 7.5mg    Depression    Depression with anxiety 10/29/2017   DJD (degenerative joint disease)    Erectile dysfunction 10/29/2017   Gastroparesis    GERD (gastroesophageal reflux disease)    Habitual alcohol use    Hyperlipidemia    Lumbar disc disease 11/16/2015   Palpitations     Past Surgical History:  Procedure Laterality Date   APPENDECTOMY     CERVICAL DISCECTOMY  4/06   decompression, foraminotomy, and fusion Dr. Leeann    COLONOSCOPY  11/2012   1 TA, mod diverticulosis, rpt 5 yrs Verda)   EAR CYST EXCISION Right 04/30/2015   Procedure: CYST REMOVAL Right knee;  Surgeon: Toribio JULIANNA Chancy, MD;  Location: [facility];  Service: Orthopedics;  Laterality: Right;   ESOPHAGOGASTRODUODENOSCOPY  02/2014   gastric polyps, retained gastric residue s/p dilatation Verda)   KNEE ARTHROSCOPY WITH LATERAL MENISECTOMY Right 04/30/2015   Procedure: RIGHT KNEE ARTHROSCOPY;  Surgeon: Toribio JULIANNA Chancy, MD;  Location: [facility];  Service: Orthopedics;  Laterality: Right;   KNEE ARTHROSCOPY WITH MENISCAL REPAIR Right 04/30/2015   Procedure: KNEE ARTHROSCOPY WITH OPEN MENISCAL REPAIR;  Surgeon: Toribio JULIANNA Chancy, MD;  Location: [facility];  Service: Orthopedics;  Laterality: Right;   SHOULDER SURGERY Right 11/05   Arthroscopy, debridement, distal resection   SHOULDER SURGERY Left 05/2011   Dr. Chancy   UPPER GASTROINTESTINAL  ENDOSCOPY     X3    Social History Edward Chen.  reports that he quit smoking about 3 years ago. His smoking use included cigarettes. He started smoking about 23 years ago. He has never used smokeless tobacco. He reports current alcohol use of about 12.0 standard drinks of alcohol per week. He reports that he does not use drugs.  family history includes Lung disease in his father.  Allergies[1]     PHYSICAL  EXAMINATION: Vital signs: BP 128/72   Pulse 76   Ht 5' 7 (1.702 m)   Wt 146 lb (66.2 kg)   BMI 22.87 kg/m   Constitutional: generally well-appearing, no acute distress Psychiatric: alert and oriented x3, cooperative Eyes: extraocular movements intact, anicteric, conjunctiva pink Mouth: oral pharynx moist, no lesions Neck: supple no lymphadenopathy Cardiovascular: heart regular rate and rhythm, no murmur Lungs: clear to auscultation bilaterally Abdomen: soft, nontender, nondistended, no obvious ascites, no peritoneal signs, normal bowel sounds, no organomegaly Rectal: Omitted Extremities: no, cyanosis, or lower extremity edema bilaterally Skin: no lesions on visible extremities Neuro: No focal deficits.  Cranial nerves intact   ASSESSMENT:  1.  Chronic epigastric pain postprandially.  Ongoing.  This despite PPI.  Suspect functional.  Rule out upper GI mucosal lesion.  Rule out gallbladder. 2.  GERD.  Classic symptoms controlled on PPI 3.  History of peptic stricture status post dilation.  No significant dysphagia at present. 4.  Mild gastroparesis.  This secondary to narcotics.  This may explain his postprandial bloating component. 5.  Personal history of nonadvanced adenoma.  Surveillance up-to-date.  Last examination in January 2019   PLAN:  1.  Reflux precautions 2.  Continue PPI 3.  Schedule upper endoscopy further evaluate pain.The nature of the procedure, as well as the risks, benefits, and alternatives were carefully and thoroughly reviewed with the patient. Ample time for discussion and questions allowed. The patient understood, was satisfied, and agreed to proceed. 4.  Schedule abdominal ultrasound to evaluate pain.The nature of the procedure, as well as the risks, benefits, and alternatives were carefully and thoroughly reviewed with the patient. Ample time for discussion and questions allowed. The patient understood, was satisfied, and agreed to proceed. 5.   Surveillance colonoscopy around January 2020. A total time of 60 minutes was spent preparing to see the patient, reviewing office notes, laboratories, x-rays, obtaining comprehensive history, performing medically appropriate physical exam, counseling and educating the patient regarding the above listed issues, ordering endoscopic procedures, ordering advanced radiology study, and documenting clinical information in the health record        [1]  Allergies Allergen Reactions   Other Anaphylaxis    BLACKBERRIES   Peanuts [Peanut Oil] Anaphylaxis   Shellfish Allergy Anaphylaxis   Betadine [Povidone Iodine] Other (See Comments)    Due to shellfish allergy    Lexapro [Escitalopram Oxalate] Other (See Comments)   Nexium  [Esomeprazole  Magnesium ] Other (See Comments)    Chest pain, does ok with prilosec   Povidone-Iodine Other (See Comments)    povidone-iodine

## 2024-10-04 ENCOUNTER — Telehealth: Payer: Self-pay

## 2024-10-04 NOTE — Telephone Encounter (Signed)
 Contacted pt concerning apt with PCP for Monday 1/26. Advised due to weather the office will not open until 12  and his apt will need RS. He reports he did not know he had an apt and he just saw PCP about 2 months ago. Explained apt was for CPE. He reported someone already made an apt with a new provider in March since Dr. Norleen is retiring at the end of this month. Advised there is an apt in March. Advised his apt on Monday will be cancelled. Pt verbalized understanding.

## 2024-10-07 ENCOUNTER — Encounter: Payer: Medicare Other | Admitting: Internal Medicine

## 2024-10-09 ENCOUNTER — Telehealth: Payer: Self-pay | Admitting: *Deleted

## 2024-10-09 NOTE — Telephone Encounter (Signed)
 [place] is in the forecast this weekend, so well be checking in with patients this weekend, about their endoscopy appointments for Monday, Feb 2nd. Depending on road conditions, we may need to adjust arrival times or possibly reschedule procedures.  Please let us  know via your MyChart app if you want to reschedule your Monday procedure ahead of this decision. We ask that you keep in mind you may receive a phone call or MyChart message from us  this weekend and reply as soon as you can via your MyChart app.  Thank you, we appreciate your flexibility.   Called pt to discuss above message. Pt verbalized understanding and knows to answer any unknown numbers over the weekend.

## 2024-10-10 ENCOUNTER — Ambulatory Visit (HOSPITAL_BASED_OUTPATIENT_CLINIC_OR_DEPARTMENT_OTHER)
Admission: RE | Admit: 2024-10-10 | Discharge: 2024-10-10 | Disposition: A | Discharge status: Home / Self Care | Source: Ambulatory Visit | Attending: Internal Medicine | Admitting: Internal Medicine

## 2024-10-10 DIAGNOSIS — R140 Abdominal distension (gaseous): Secondary | ICD-10-CM | POA: Insufficient documentation

## 2024-10-10 DIAGNOSIS — R1013 Epigastric pain: Secondary | ICD-10-CM | POA: Diagnosis present

## 2024-10-12 ENCOUNTER — Ambulatory Visit: Payer: Self-pay | Admitting: Internal Medicine

## 2024-10-14 ENCOUNTER — Encounter: Admitting: Internal Medicine

## 2024-10-16 ENCOUNTER — Encounter: Payer: Self-pay | Admitting: Internal Medicine

## 2024-10-16 ENCOUNTER — Ambulatory Visit: Admitting: Internal Medicine

## 2024-10-16 VITALS — BP 114/69 | HR 75 | Temp 97.0°F | Resp 10 | Ht 67.0 in | Wt 146.0 lb

## 2024-10-16 DIAGNOSIS — K3184 Gastroparesis: Secondary | ICD-10-CM

## 2024-10-16 DIAGNOSIS — R1013 Epigastric pain: Secondary | ICD-10-CM | POA: Diagnosis not present

## 2024-10-16 DIAGNOSIS — K449 Diaphragmatic hernia without obstruction or gangrene: Secondary | ICD-10-CM

## 2024-10-16 DIAGNOSIS — K317 Polyp of stomach and duodenum: Secondary | ICD-10-CM | POA: Diagnosis not present

## 2024-10-16 DIAGNOSIS — R140 Abdominal distension (gaseous): Secondary | ICD-10-CM

## 2024-10-16 MED ORDER — SODIUM CHLORIDE 0.9 % IV SOLN: 500.0000 mL | INTRAVENOUS | Status: DC

## 2024-10-16 NOTE — Progress Notes (Signed)
 Pt's states no medical or surgical changes since previsit or office visit.

## 2024-10-16 NOTE — Progress Notes (Signed)
 Expand All Collapse All HISTORY OF PRESENT ILLNESS:   Edward Chen. is a 64 y.o. male with multiple medical problems as listed below including chronic pain syndrome on narcotics, asthmatic bronchitis, hypertension, and anxiety/depression.   He presents today with a chief complaint of postprandial epigastric pain and bloating.  Patient was seen in this office August 08, 2017 regarding GERD with mild intermittent solid food dysphagia, epigastric discomfort, and mild gastroparesis.  He also has a history of adenomatous colon polyps.  He underwent colonoscopy and upper endoscopy October 04, 2017 colonoscopy revealed a non-adenomatous colon polyp.  Otherwise normal.  Follow-up in 10 years recommended.  Upper endoscopy revealed a distal esophageal stricture with dilated with 54 French Maloney dilator.  Exam was otherwise unremarkable.  No cause for his chronic epigastric pain found.  A CT of the abdomen and pelvis was ordered, but canceled.  He has not been seen since.   Patient tells me that he continues with postprandial epigastric pain with bloating.  May occur hours after eating.  He continues to take oxycodone  daily.  He did have significant weight loss after his divorce, but tells me his weight has been stable over the past 2 years.  This is confirmed when reviewing the medical record.  He continues on omeprazole  20 mg daily for GERD.  He denies any significant dysphagia at this time.  He is worried that maybe his gallbladder.  Review of blood work from December 2025 reveals unremarkable comprehensive metabolic panel, CBC, and lipid panel.  Hemoglobin 15.0.  Hemoglobin A1c 4.9.    REVIEW OF SYSTEMS:   All non-GI ROS negative except for       Past Medical History:  Diagnosis Date   Abnormal drug screen 09/2014    neg xanax  (but PRN use), pos EtOH   Acute asthmatic bronchitis     Allergic rhinitis     Allergy     Anxiety      with h/o attacks on xanax  for chest pain   Asthma 11/16/2015    Benign hypertension     Borderline diabetes mellitus     Cervical disc disease 11/16/2015   Chronic pain syndrome      [facility] - percocets 7.5mg    Depression     Depression with anxiety 10/29/2017   DJD (degenerative joint disease)     Erectile dysfunction 10/29/2017   Gastroparesis     GERD (gastroesophageal reflux disease)     Habitual alcohol use     Hyperlipidemia     Lumbar disc disease 11/16/2015   Palpitations                 Past Surgical History:  Procedure Laterality Date   APPENDECTOMY       CERVICAL DISCECTOMY   4/06    decompression, foraminotomy, and fusion Dr. Leeann    COLONOSCOPY   11/2012    1 TA, mod diverticulosis, rpt 5 yrs Verda)   EAR CYST EXCISION Right 04/30/2015    Procedure: CYST REMOVAL Right knee;  Surgeon: Toribio JULIANNA Chancy, MD;  Location: [facility];  Service: Orthopedics;  Laterality: Right;   ESOPHAGOGASTRODUODENOSCOPY   02/2014    gastric polyps, retained gastric residue s/p dilatation Verda)   KNEE ARTHROSCOPY WITH LATERAL MENISECTOMY Right 04/30/2015    Procedure: RIGHT KNEE ARTHROSCOPY;  Surgeon: Toribio JULIANNA Chancy, MD;  Location: [facility];  Service: Orthopedics;  Laterality: Right;   KNEE ARTHROSCOPY WITH MENISCAL REPAIR Right 04/30/2015  Procedure: KNEE ARTHROSCOPY WITH OPEN MENISCAL REPAIR;  Surgeon: Toribio JULIANNA Chancy, MD;  Location: [facility];  Service: Orthopedics;  Laterality: Right;   SHOULDER SURGERY Right 11/05    Arthroscopy, debridement, distal resection   SHOULDER SURGERY Left 05/2011    Dr. Chancy   UPPER GASTROINTESTINAL ENDOSCOPY        X3          Social History Barre Aydelott.  reports that he quit smoking about 3 years ago. His smoking use included cigarettes. He started smoking about 23 years ago. He has never used smokeless tobacco. He reports current alcohol use of about 12.0 standard drinks of alcohol per week. He reports that he does not use drugs.   family  history includes Lung disease in his father.   [Allergies]  [Allergies]      Allergen Reactions   Other Anaphylaxis      BLACKBERRIES   Peanuts [Peanut Oil] Anaphylaxis   Shellfish Allergy Anaphylaxis   Betadine [Povidone Iodine] Other (See Comments)      Due to shellfish allergy     Lexapro [Escitalopram Oxalate] Other (See Comments)   Nexium  [Esomeprazole  Magnesium ] Other (See Comments)      Chest pain, does ok with prilosec   Povidone-Iodine Other (See Comments)      povidone-iodine         PHYSICAL EXAMINATION: Vital signs: BP 128/72   Pulse 76   Ht 5' 7 (1.702 m)   Wt 146 lb (66.2 kg)   BMI 22.87 kg/m   Constitutional: generally well-appearing, no acute distress Psychiatric: alert and oriented x3, cooperative Eyes: extraocular movements intact, anicteric, conjunctiva pink Mouth: oral pharynx moist, no lesions Neck: supple no lymphadenopathy Cardiovascular: heart regular rate and rhythm, no murmur Lungs: clear to auscultation bilaterally Abdomen: soft, nontender, nondistended, no obvious ascites, no peritoneal signs, normal bowel sounds, no organomegaly Rectal: Omitted Extremities: no, cyanosis, or lower extremity edema bilaterally Skin: no lesions on visible extremities Neuro: No focal deficits.  Cranial nerves intact     ASSESSMENT:   1.  Chronic epigastric pain postprandially.  Ongoing.  This despite PPI.  Suspect functional.  Rule out upper GI mucosal lesion.  Rule out gallbladder. 2.  GERD.  Classic symptoms controlled on PPI 3.  History of peptic stricture status post dilation.  No significant dysphagia at present. 4.  Mild gastroparesis.  This secondary to narcotics.  This may explain his postprandial bloating component. 5.  Personal history of nonadvanced adenoma.  Surveillance up-to-date.  Last examination in January 2019     PLAN:   1.  Reflux precautions 2.  Continue PPI 3.  Schedule upper endoscopy further evaluate pain.The nature of the  procedure, as well as the risks, benefits, and alternatives were carefully and thoroughly reviewed with the patient. Ample time for discussion and questions allowed. The patient understood, was satisfied, and agreed to proceed. 4.  Schedule abdominal ultrasound to evaluate pain.The nature of the procedure, as well as the risks, benefits, and alternatives were carefully and thoroughly reviewed with the patient. Ample time for discussion and questions allowed. The patient understood, was satisfied, and agreed to proceed.      Recent H&P as above.  Ultrasound did not show any significant abnormalities to explain pain.  Now for EGD

## 2024-10-16 NOTE — Patient Instructions (Addendum)
 -  Resume previous diet. - Continue present medications.  YOU HAD AN ENDOSCOPIC PROCEDURE TODAY AT THE [facility]:   Refer to the procedure report that was given to you for any specific questions about what was found during the examination.  If the procedure report does not answer your questions, please call your gastroenterologist to clarify.  If you requested that your care partner not be given the details of your procedure findings, then the procedure report has been included in a sealed envelope for you to review at your convenience later.  YOU SHOULD EXPECT: Some feelings of bloating in the abdomen. Passage of more gas than usual.  Walking can help get rid of the air that was put into your GI tract during the procedure and reduce the bloating. If you had a lower endoscopy (such as a colonoscopy or flexible sigmoidoscopy) you may notice spotting of blood in your stool or on the toilet paper. If you underwent a bowel prep for your procedure, you may not have a normal bowel movement for a few days.  Please Note:  You might notice some irritation and congestion in your nose or some drainage.  This is from the oxygen used during your procedure.  There is no need for concern and it should clear up in a day or so.  SYMPTOMS TO REPORT IMMEDIATELY:    Following upper endoscopy (EGD)  Vomiting of blood or coffee ground material  New chest pain or pain under the shoulder blades  Painful or persistently difficult swallowing  New shortness of breath  Fever of 100F or higher  Black, tarry-looking stools  For urgent or emergent issues, a gastroenterologist can be reached at any hour by calling [phone]. Do not use MyChart messaging for urgent concerns.    DIET:  We do recommend a small meal at first, but then you may proceed to your regular diet.  Drink plenty of fluids but you should avoid alcoholic beverages for 24 hours.  ACTIVITY:  You should plan to take it easy for the rest  of today and you should NOT DRIVE or use heavy machinery until tomorrow (because of the sedation medicines used during the test).    FOLLOW UP: Our staff will call the number listed on your records the next business day following your procedure.  We will call around 7:15- 8:00 am to check on you and address any questions or concerns that you may have regarding the information given to you following your procedure. If we do not reach you, we will leave a message.     If any biopsies were taken you will be contacted by phone or by letter within the next 1-3 weeks.  Please call us at [phone] if you have not heard about the biopsies in 3 weeks.    SIGNATURES/CONFIDENTIALITY: You and/or your care partner have signed paperwork which will be entered into your electronic medical record.  These signatures attest to the fact that that the information above on your After Visit Summary has been reviewed and is understood.  Full responsibility of the confidentiality of this discharge information lies with you and/or your care-partner.

## 2024-10-16 NOTE — Op Note (Signed)
 [facility] Patient Name: Edward Chen Procedure Date: 10/16/2024 2:11 PM MRN: [id number] Endoscopist: Norleen SAILOR. Abran , MD, [id number] Age: 64 Referring MD:  Date of Birth: [birth year] Gender: Male Account #: [id number] Procedure:                Upper GI endoscopy Indications:              Epigastric abdominal pain, Abdominal bloating after                            meals Medicines:                Monitored Anesthesia Care Procedure:                Pre-Anesthesia Assessment:                           - Prior to the procedure, a History and Physical                            was performed, and patient medications and                            allergies were reviewed. The patient's tolerance of                            previous anesthesia was also reviewed. The risks                            and benefits of the procedure and the sedation                            options and risks were discussed with the patient.                            All questions were answered, and informed consent                            was obtained. Prior Anticoagulants: The patient has                            taken no anticoagulant or antiplatelet agents. ASA                            Grade Assessment: II - A patient with mild systemic                            disease. After reviewing the risks and benefits,                            the patient was deemed in satisfactory condition to                            undergo the procedure.  After obtaining informed consent, the endoscope was                            passed under direct vision. Throughout the                            procedure, the patient's blood pressure, pulse, and                            oxygen saturations were monitored continuously. The                            GIF [id number] #[id number] was introduced through the                            mouth, and advanced to the second part of duodenum.                             The upper GI endoscopy was accomplished without                            difficulty. The patient tolerated the procedure                            well. Scope In: Scope Out: Findings:                 The esophagus was normal.                           The stomach was normal except for a few benign                            fundic gland type polyps. Small hiatal hernia                           The examined duodenum was normal.                           The cardia and gastric fundus were normal on                            retroflexion. Complications:            No immediate complications. Estimated Blood Loss:     Estimated blood loss: none. Impression:               - Normal esophagus.                           - Normal stomach save a few small benign fundic                            gland polyps. Small hiatal hernia                           - Normal examined  duodenum.                           - No specimens collected.                           - Symptoms felt secondary to narcotic induced                            gastric dysmotility Recommendation:           - Patient has a contact number available for                            emergencies. The signs and symptoms of potential                            delayed complications were discussed with the                            patient. Return to normal activities tomorrow.                            Written discharge instructions were provided to the                            patient.                           - Resume previous diet.                           - Continue present medications. Norleen SAILOR. Abran, MD 10/16/2024 2:38:03 PM This report has been signed electronically.

## 2024-10-16 NOTE — Progress Notes (Signed)
 Transferred to PACU via stretcher.  Not responding to stimulation at this time.  VSS upon leaving procedure room.

## 2024-10-17 ENCOUNTER — Telehealth: Payer: Self-pay | Admitting: *Deleted

## 2024-10-17 NOTE — Telephone Encounter (Signed)
" °  Follow up Call-     10/16/2024    1:25 PM  Call back number  Post procedure Call Back phone  # [phone]  Permission to leave phone message Yes     Patient questions:  Do you have a fever, pain , or abdominal swelling? No. Pain Score  0 *  Have you tolerated food without any problems? Yes.    Have you been able to return to your normal activities? Yes.    Do you have any questions about your discharge instructions: Diet   No. Medications  No. Follow up visit  No.  Do you have questions or concerns about your Care? No.  Actions: * If pain score is 4 or above: No action needed, pain <4.   "

## 2024-12-06 ENCOUNTER — Encounter: Admitting: Nurse Practitioner
